# Patient Record
Sex: Female | Born: 1990 | Race: Black or African American | Hispanic: No | Marital: Married | Smoking: Never smoker
Health system: Southern US, Community
[De-identification: ages and names within clinical notes are randomized; demographics above are authoritative.]

## PROBLEM LIST (undated history)

## (undated) ENCOUNTER — Inpatient Hospital Stay (HOSPITAL_COMMUNITY): Payer: Self-pay

## (undated) ENCOUNTER — Inpatient Hospital Stay (HOSPITAL_COMMUNITY): Payer: Medicaid Other

## (undated) DIAGNOSIS — Z3490 Encounter for supervision of normal pregnancy, unspecified, unspecified trimester: Secondary | ICD-10-CM

## (undated) DIAGNOSIS — D219 Benign neoplasm of connective and other soft tissue, unspecified: Secondary | ICD-10-CM

## (undated) DIAGNOSIS — F419 Anxiety disorder, unspecified: Secondary | ICD-10-CM

## (undated) DIAGNOSIS — F32A Depression, unspecified: Secondary | ICD-10-CM

## (undated) DIAGNOSIS — O139 Gestational [pregnancy-induced] hypertension without significant proteinuria, unspecified trimester: Secondary | ICD-10-CM

## (undated) DIAGNOSIS — O039 Complete or unspecified spontaneous abortion without complication: Secondary | ICD-10-CM

## (undated) DIAGNOSIS — D649 Anemia, unspecified: Secondary | ICD-10-CM

## (undated) DIAGNOSIS — D271 Benign neoplasm of left ovary: Secondary | ICD-10-CM

## (undated) DIAGNOSIS — J45909 Unspecified asthma, uncomplicated: Secondary | ICD-10-CM

## (undated) DIAGNOSIS — Z9079 Acquired absence of other genital organ(s): Secondary | ICD-10-CM

## (undated) DIAGNOSIS — A6000 Herpesviral infection of urogenital system, unspecified: Secondary | ICD-10-CM

## (undated) DIAGNOSIS — Z9889 Other specified postprocedural states: Secondary | ICD-10-CM

## (undated) HISTORY — PX: ELBOW SURGERY: SHX618

---
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
## 2011-07-16 HISTORY — PX: LEFT OOPHORECTOMY: SHX1961

## 2016-08-10 ENCOUNTER — Emergency Department (HOSPITAL_COMMUNITY)
Admission: EM | Admit: 2016-08-10 | Discharge: 2016-08-10 | Disposition: A | Discharge status: Home / Self Care | Payer: Medicaid Other | Attending: Emergency Medicine | Admitting: Emergency Medicine

## 2016-08-10 ENCOUNTER — Encounter (HOSPITAL_COMMUNITY): Payer: Self-pay

## 2016-08-10 ENCOUNTER — Emergency Department (HOSPITAL_COMMUNITY): Payer: Medicaid Other

## 2016-08-10 DIAGNOSIS — R05 Cough: Secondary | ICD-10-CM | POA: Diagnosis present

## 2016-08-10 DIAGNOSIS — R509 Fever, unspecified: Secondary | ICD-10-CM | POA: Diagnosis not present

## 2016-08-10 DIAGNOSIS — R6889 Other general symptoms and signs: Secondary | ICD-10-CM

## 2016-08-10 IMAGING — CR DG CHEST 2V
2 series · 2 of 2 positions shown · non-contrast
Comparison: None available

CLINICAL DATA: Fever, cough

EXAM:
CHEST  2 VIEW

[chest pa]
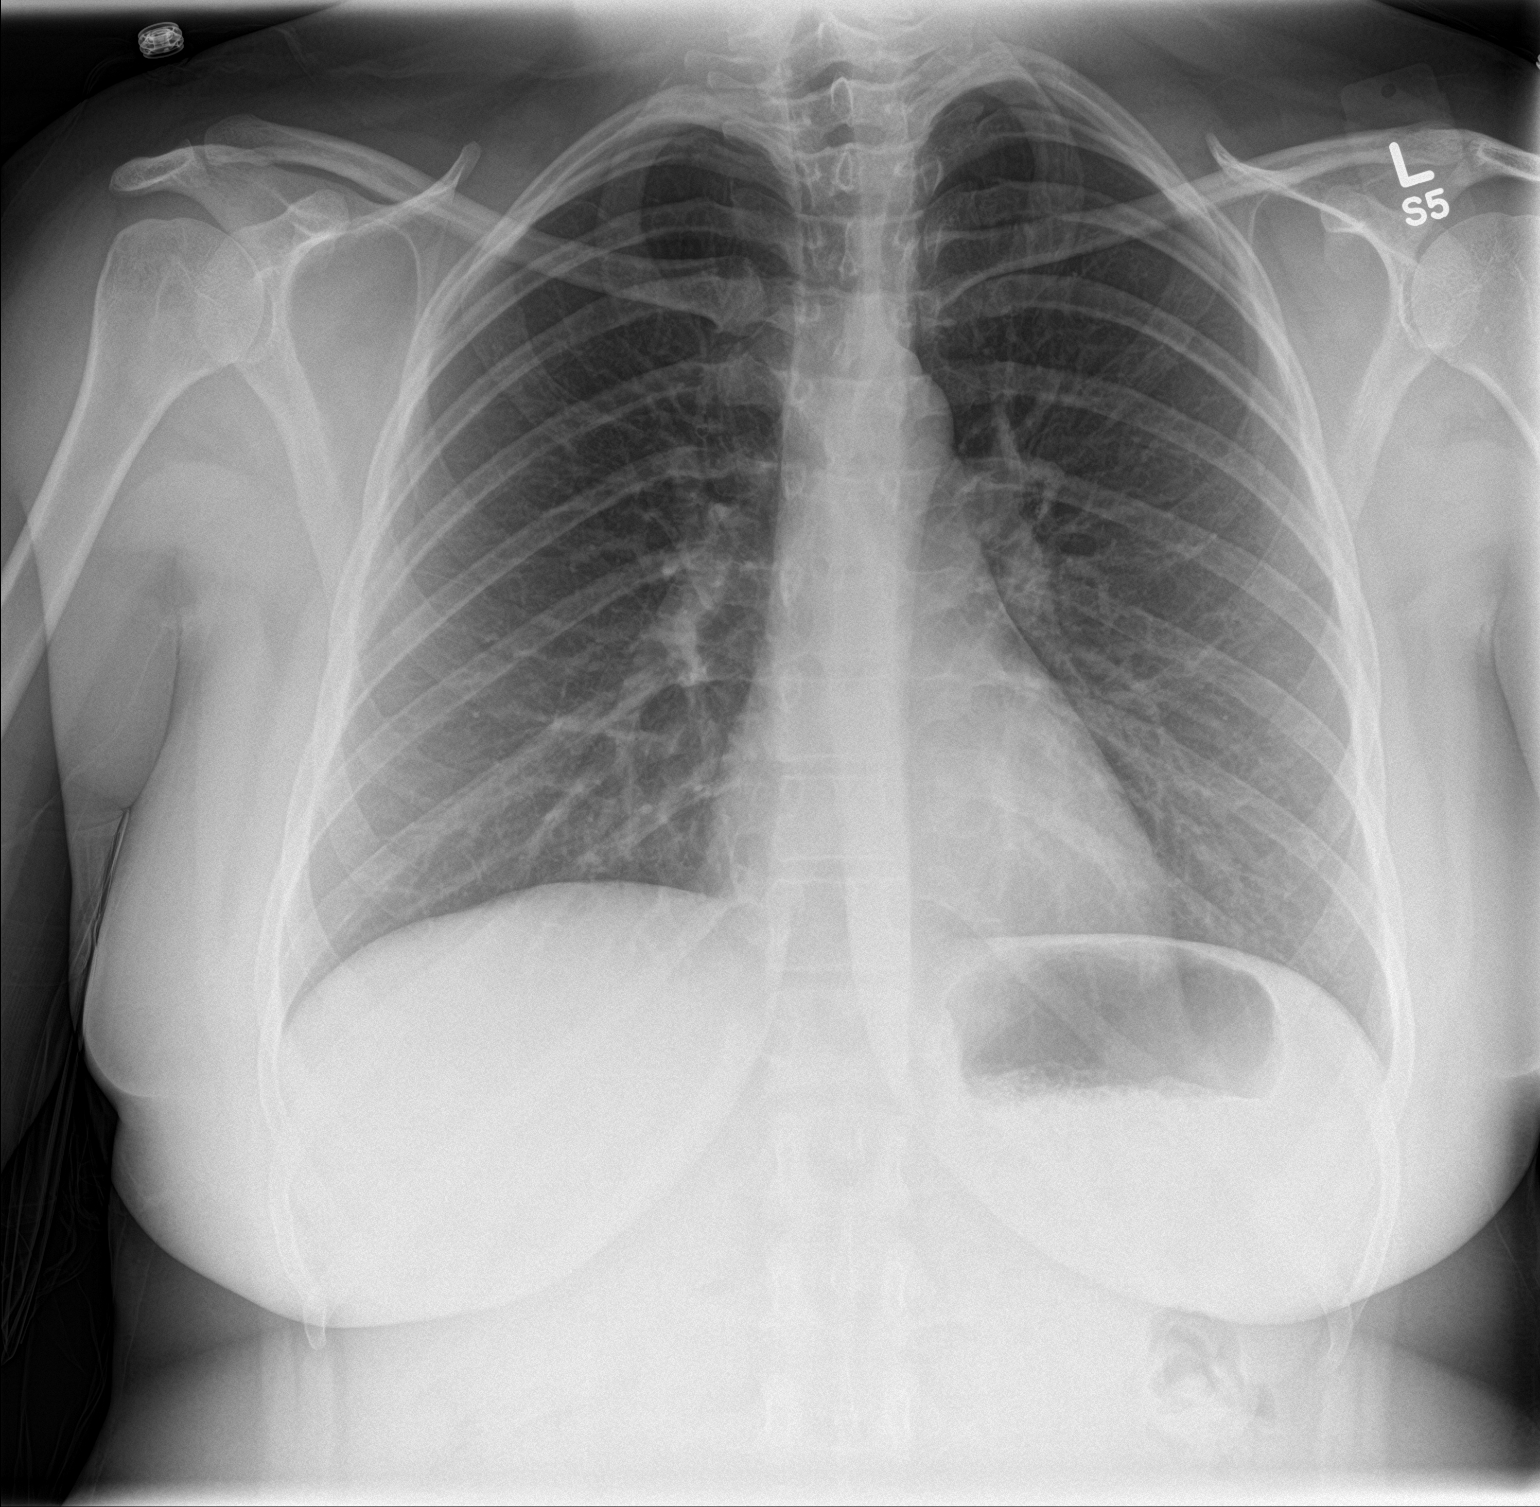

[chest lat]
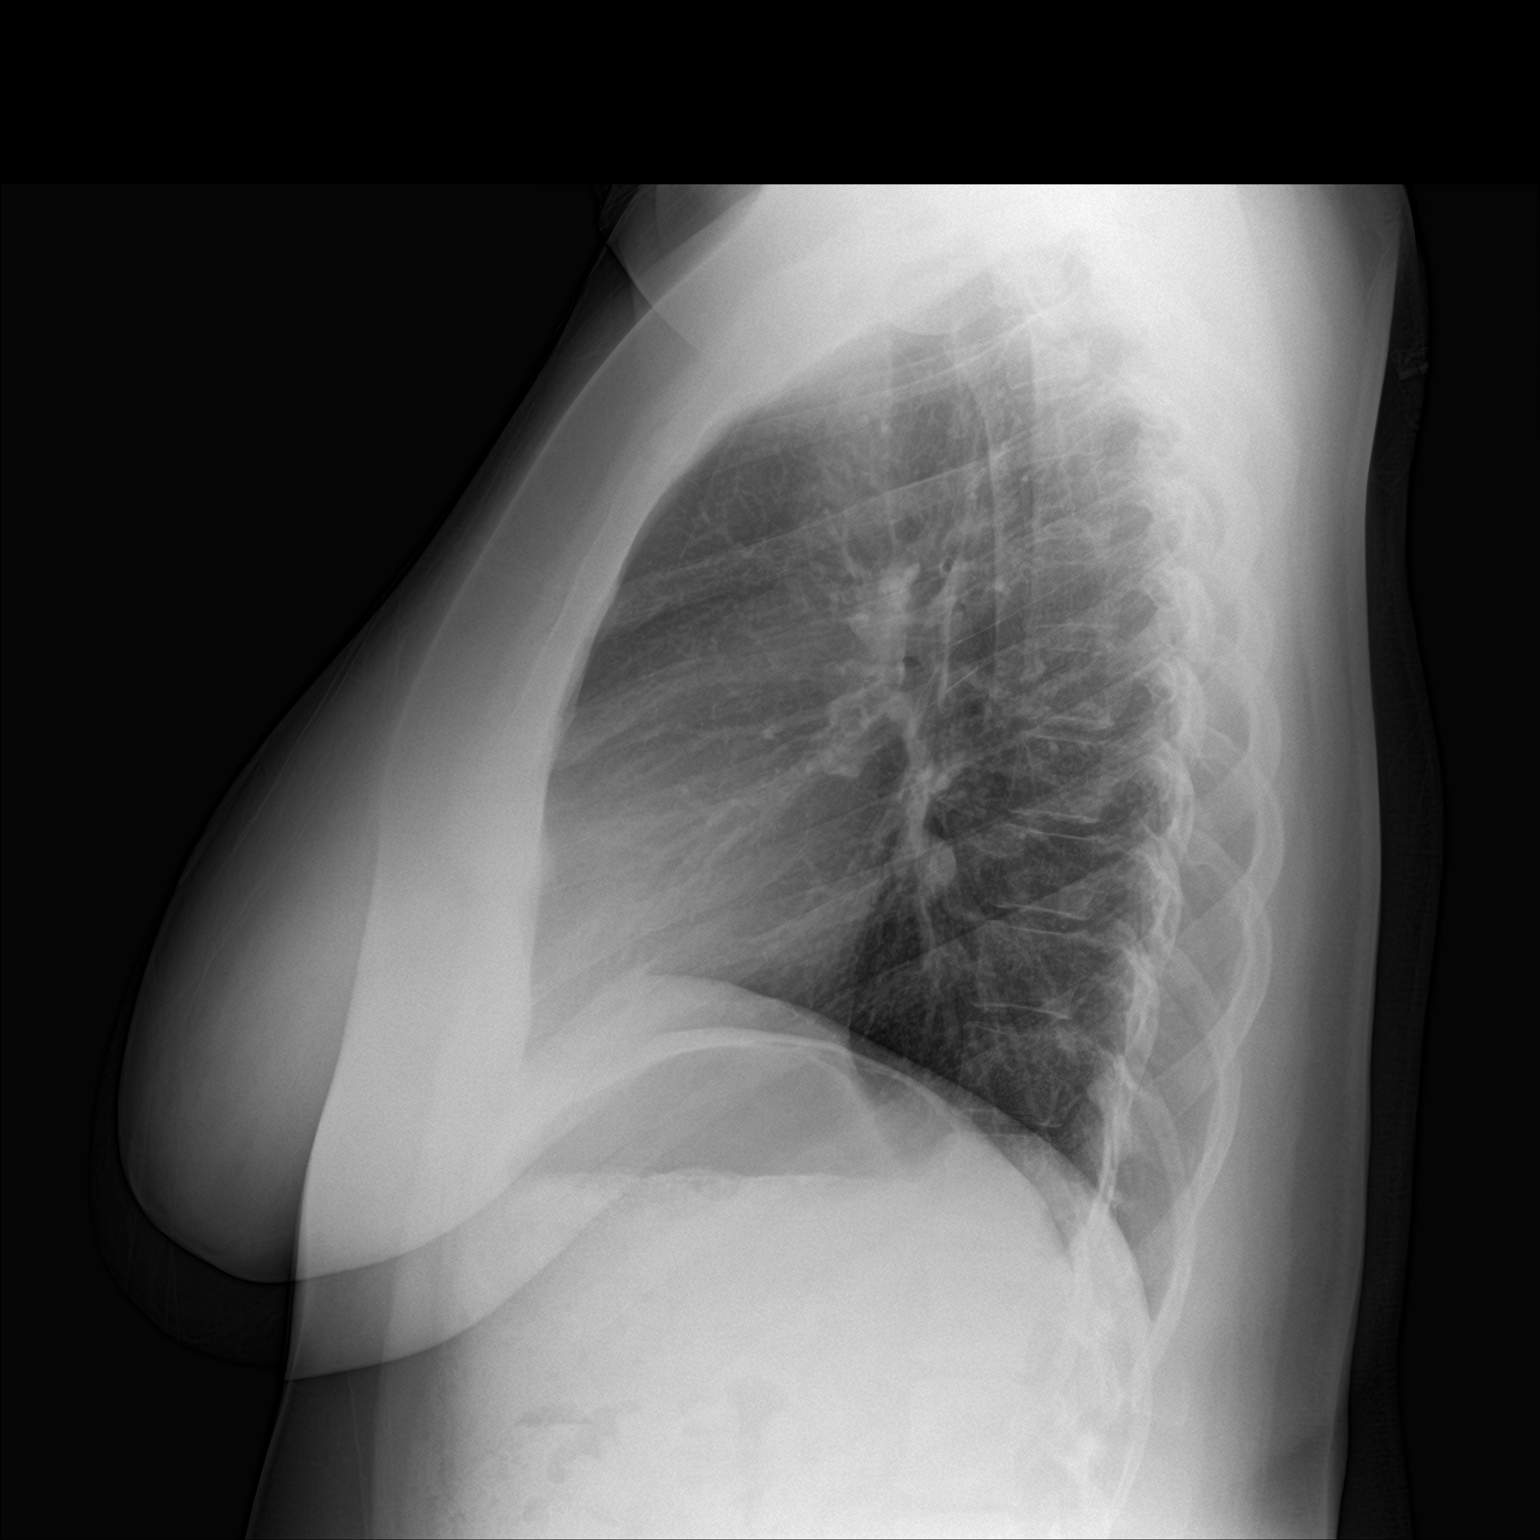

[2 of 2 positions shown; findings below may reference images not displayed]

FINDINGS: The heart size and mediastinal contours are within normal limits.
Both lungs are clear. The visualized skeletal structures are
unremarkable.
IMPRESSION: No active cardiopulmonary disease.

## 2016-08-10 MED ORDER — SODIUM CHLORIDE 0.9 % IV BOLUS (SEPSIS)
1000.0000 mL | Freq: Once | INTRAVENOUS | Status: AC
  Administered 2016-08-10: 1000 mL via INTRAVENOUS

## 2016-08-10 MED ORDER — ACETAMINOPHEN 325 MG PO TABS
650.0000 mg | ORAL_TABLET | Freq: Once | ORAL | Status: AC
  Administered 2016-08-10: 650 mg via ORAL
  Filled 2016-08-10: qty 2

## 2016-08-10 MED ORDER — IBUPROFEN 200 MG PO TABS
600.0000 mg | ORAL_TABLET | Freq: Once | ORAL | Status: AC
  Administered 2016-08-10: 600 mg via ORAL
  Filled 2016-08-10: qty 1

## 2016-08-10 NOTE — ED Triage Notes (Signed)
Patient here with cough, congestion, headache and chills x 2 days. States that she has fatigue with same. Alert and oriented

## 2016-08-10 NOTE — Discharge Instructions (Signed)
Take Tylenol/Motrin for fever/pain Drink plenty of fluids Return if symptoms are worsening

## 2016-08-10 NOTE — ED Provider Notes (Signed)
[facility] DEPT Provider Note   CSN: [id number] Arrival date & time: 08/10/16  1029     History   Chief Complaint Chief Complaint  Patient presents with  . Cough  . Generalized Body Aches    HPI Anna Burns is a 26 y.o. female who presents with flu-like symptoms. No significant PMH. She states that yesterday she started having a productive cough. This morning she woke up with fever, chills, headache, generalized body aches, and nasal congestion. Her daughter, who is 4, has similar symptoms and is currently the ED as well. She has not taken any medicine to help with her symptoms. She has not had her flu vaccine this year. She denies neck pain, ear pain, sore throat, SOB, chest pain, abdominal pain, N/V/D, dysuria.   HPI  History reviewed. No pertinent past medical history.  There are no active problems to display for this patient.   History reviewed. No pertinent surgical history.  OB History    No data available       Home Medications    Prior to Admission medications   Not on File    Family History No family history on file.  Social History Social History  Substance Use Topics  . Smoking status: Not on file  . Smokeless tobacco: Not on file  . Alcohol use Not on file     Allergies   Contrast media [iodinated diagnostic agents]   Review of Systems Review of Systems  Constitutional: Positive for chills, fatigue and fever.  HENT: Positive for congestion and rhinorrhea. Negative for sore throat.   Respiratory: Positive for cough. Negative for shortness of breath.   Cardiovascular: Negative for chest pain.  Gastrointestinal: Negative for abdominal pain, diarrhea, nausea and vomiting.  Genitourinary: Negative for dysuria.  Musculoskeletal: Positive for myalgias.  Neurological: Positive for headaches.  All other systems reviewed and are negative.    Physical Exam Updated Vital Signs BP 124/78   Pulse 105   Temp 100.1 F (37.8 C) (Oral)    Resp 22   Ht 5\' 4"  (1.626 m)   Wt 86.2 kg   SpO2 96%   BMI 32.61 kg/m   Physical Exam  Constitutional: She is oriented to person, place, and time. She appears well-developed and well-nourished. She appears ill. No distress.  HENT:  Head: Normocephalic and atraumatic.  Right Ear: Hearing, tympanic membrane, external ear and ear canal normal.  Left Ear: Hearing, tympanic membrane, external ear and ear canal normal.  Nose: Mucosal edema and rhinorrhea present.  Mouth/Throat: Uvula is midline, oropharynx is clear and moist and mucous membranes are normal.  Eyes: Conjunctivae are normal. Pupils are equal, round, and reactive to light. Right eye exhibits no discharge. Left eye exhibits no discharge. No scleral icterus.  Neck: Normal range of motion.  Cardiovascular: Tachycardia present.  Exam reveals no gallop and no friction rub.   No murmur heard. Pulmonary/Chest: Effort normal and breath sounds normal. No respiratory distress. She has no wheezes. She has no rales. She exhibits no tenderness.  Abdominal: Soft. Bowel sounds are normal. She exhibits no distension and no mass. There is no tenderness. There is no rebound and no guarding. No hernia.  Neurological: She is alert and oriented to person, place, and time.  Skin: Skin is warm and dry.  Psychiatric: She has a normal mood and affect. Her behavior is normal.  Nursing note and vitals reviewed.    ED Treatments / Results  Labs (all labs ordered are listed, but only abnormal  results are displayed) Labs Reviewed - No data to display  EKG  EKG Interpretation None       Radiology Dg Chest 2 View  Result Date: 08/10/2016 CLINICAL DATA:  Fever, cough EXAM: CHEST  2 VIEW COMPARISON:  None available FINDINGS: The heart size and mediastinal contours are within normal limits. Both lungs are clear. The visualized skeletal structures are unremarkable. IMPRESSION: No active cardiopulmonary disease. Electronically Signed   By: Jerilynn Mages.  Shick  M.D.   On: 08/10/2016 13:42    Procedures Procedures (including critical care time)  Medications Ordered in ED Medications  acetaminophen (TYLENOL) tablet 650 mg (650 mg Oral Given 08/10/16 1220)  sodium chloride 0.9 % bolus 1,000 mL (0 mLs Intravenous Stopped 08/10/16 1524)  ibuprofen (ADVIL,MOTRIN) tablet 600 mg (600 mg Oral Given 08/10/16 1406)  sodium chloride 0.9 % bolus 1,000 mL (0 mLs Intravenous Stopped 08/10/16 1640)     Initial Impression / Assessment and Plan / ED Course  I have reviewed the triage vital signs and the nursing notes.  Pertinent labs & imaging results that were available during my care of the patient were reviewed by me and considered in my medical decision making (see chart for details).  26 year old female presents with flu-like illness. She is febrile and tachycardic. Otherwise vitals are normal. Tylenol given which did not improve fever. Motrin and IVF given. CXR ordered  On recheck, temp is improved. CXR negative. She is still tachycardic. Another bolus ordered.   3rd recheck, pt reports subjective improvement. Tachycardia is improved. Advised supportive care. Opportunity for questions provided and all questions answered. Return precautions given.    Final Clinical Impressions(s) / ED Diagnoses   Final diagnoses:  Flu-like symptoms    New Prescriptions New Prescriptions   No medications on file     Recardo Evangelist, PA-C 08/10/16 [place], MD 08/11/16 [phone]

## 2016-08-10 NOTE — ED Notes (Signed)
Patient transported to X-ray 

## 2016-10-23 ENCOUNTER — Encounter (HOSPITAL_COMMUNITY): Payer: Self-pay | Admitting: *Deleted

## 2016-10-23 ENCOUNTER — Inpatient Hospital Stay (HOSPITAL_COMMUNITY)
Admission: AD | Admit: 2016-10-23 | Discharge: 2016-10-23 | Disposition: A | Discharge status: Home / Self Care | Payer: Medicaid Other | Source: Ambulatory Visit | Attending: Family Medicine | Admitting: Family Medicine

## 2016-10-23 ENCOUNTER — Inpatient Hospital Stay (HOSPITAL_COMMUNITY): Payer: Medicaid Other

## 2016-10-23 DIAGNOSIS — O418X10 Other specified disorders of amniotic fluid and membranes, first trimester, not applicable or unspecified: Secondary | ICD-10-CM | POA: Diagnosis not present

## 2016-10-23 DIAGNOSIS — O209 Hemorrhage in early pregnancy, unspecified: Secondary | ICD-10-CM

## 2016-10-23 DIAGNOSIS — Z3A01 Less than 8 weeks gestation of pregnancy: Secondary | ICD-10-CM | POA: Insufficient documentation

## 2016-10-23 DIAGNOSIS — O468X1 Other antepartum hemorrhage, first trimester: Secondary | ICD-10-CM | POA: Diagnosis not present

## 2016-10-23 DIAGNOSIS — O208 Other hemorrhage in early pregnancy: Secondary | ICD-10-CM | POA: Diagnosis not present

## 2016-10-23 HISTORY — DX: Benign neoplasm of left ovary: D27.1

## 2016-10-23 LAB — CBC
HEMATOCRIT: 36.2 % (ref 36.0–46.0)
Hemoglobin: 11.7 g/dL — ABNORMAL LOW (ref 12.0–15.0)
MCH: 25.7 pg — AB (ref 26.0–34.0)
MCHC: 32.3 g/dL (ref 30.0–36.0)
MCV: 79.6 fL (ref 78.0–100.0)
Platelets: 315 10*3/uL (ref 150–400)
RBC: 4.55 MIL/uL (ref 3.87–5.11)
RDW: 13.7 % (ref 11.5–15.5)
WBC: 8.1 10*3/uL (ref 4.0–10.5)

## 2016-10-23 LAB — URINALYSIS, ROUTINE W REFLEX MICROSCOPIC
Bilirubin Urine: NEGATIVE
GLUCOSE, UA: NEGATIVE mg/dL
HGB URINE DIPSTICK: NEGATIVE
Ketones, ur: NEGATIVE mg/dL
Leukocytes, UA: NEGATIVE
Nitrite: NEGATIVE
PH: 6 (ref 5.0–8.0)
Protein, ur: NEGATIVE mg/dL
SPECIFIC GRAVITY, URINE: 1.019 (ref 1.005–1.030)

## 2016-10-23 LAB — ABO/RH: ABO/RH(D): B POS

## 2016-10-23 LAB — HCG, QUANTITATIVE, PREGNANCY: hCG, Beta Chain, Quant, S: 15577 m[IU]/mL — ABNORMAL HIGH (ref <5)

## 2016-10-23 LAB — WET PREP, GENITAL
Clue Cells Wet Prep HPF POC: NONE SEEN
SPERM: NONE SEEN
TRICH WET PREP: NONE SEEN
YEAST WET PREP: NONE SEEN

## 2016-10-23 LAB — POCT PREGNANCY, URINE: Preg Test, Ur: POSITIVE — AB

## 2016-10-23 IMAGING — US US OB TRANSVAGINAL
1 series · 15 of 28 positions shown · non-contrast
Comparison: None

CLINICAL DATA: Vaginal bleeding in first trimester of pregnancy,
spotting this morning and intermittently for 3 weeks, about 6 weeks
pregnant, prior LEFT oophorectomy for benign ovarian tumor

EXAM:
OBSTETRIC <14 WK US AND TRANSVAGINAL OB US
TECHNIQUE: Both transabdominal and transvaginal ultrasound examinations were
performed for complete evaluation of the gestation as well as the
maternal uterus, adnexal regions, and pelvic cul-de-sac.
Transvaginal technique was performed to assess early pregnancy.

[Series 1: us ob transvaginal · 113 acquisitions, 15 frames shown]
[im 1/113]
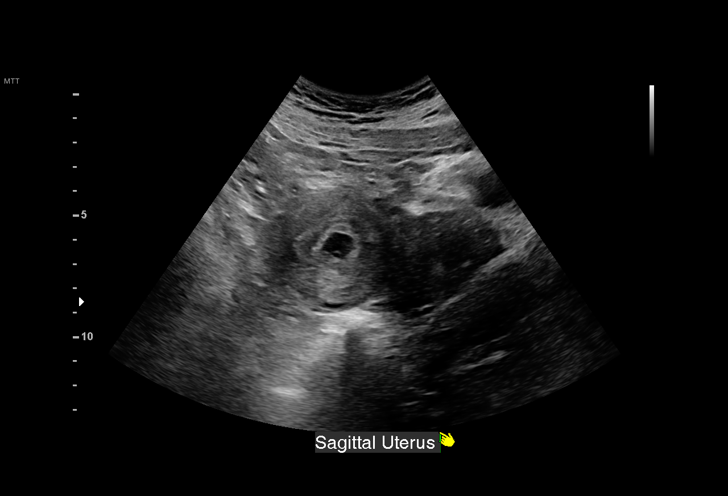
[im 9/113]
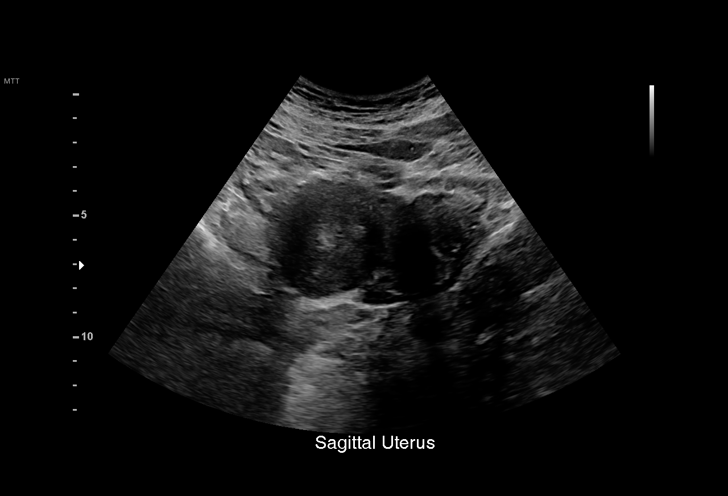
[im 17/113]
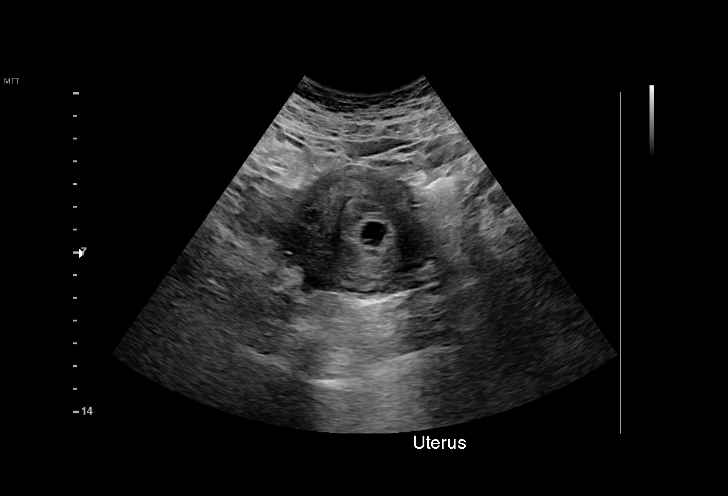
[im 25/113]
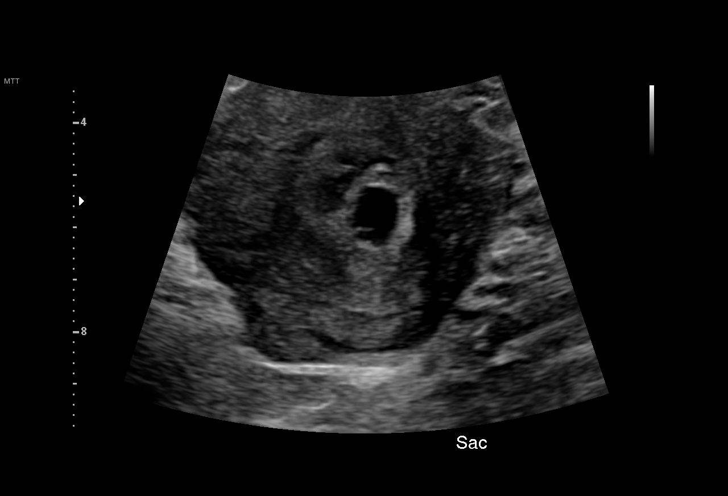
[im 34/113]
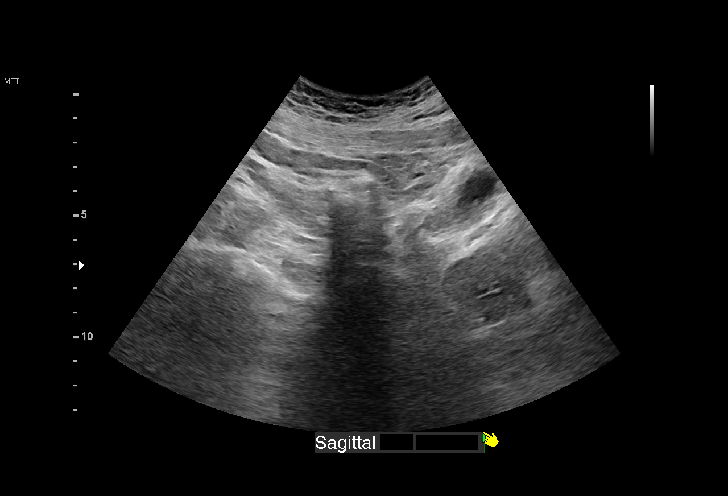
[im 42/113]
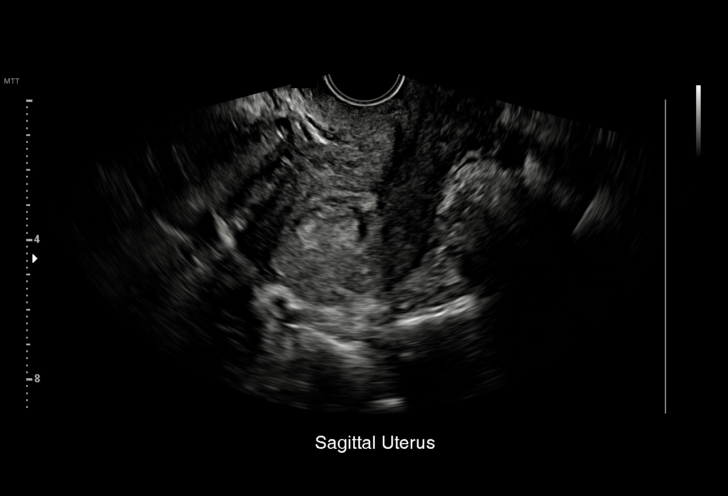
[im 50/113]
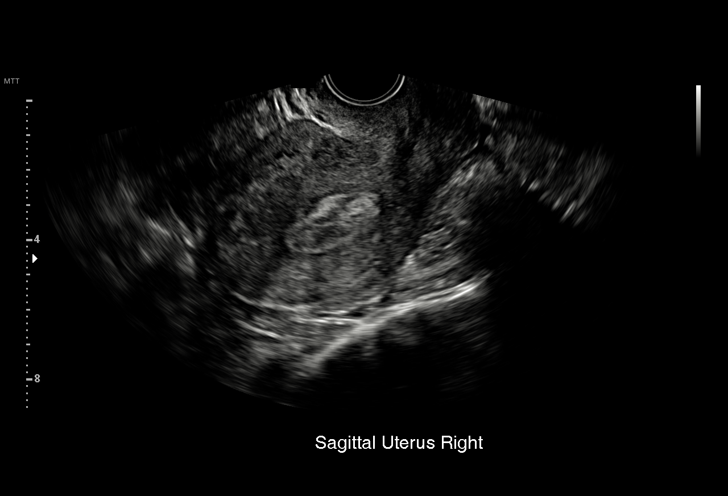
[im 59/113]
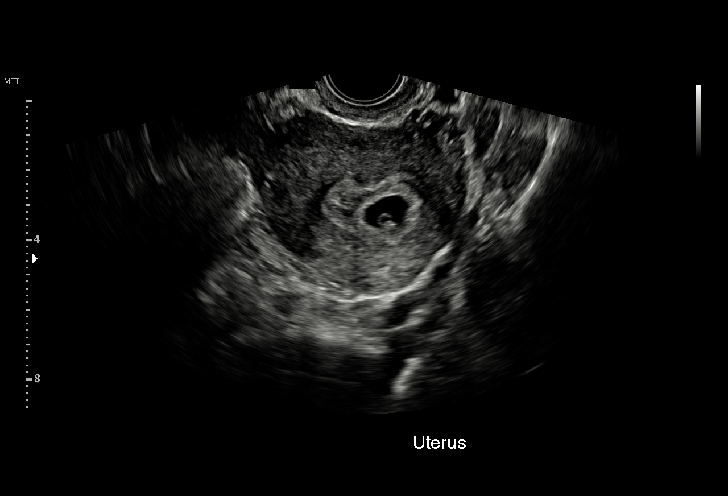
[im 63/113]
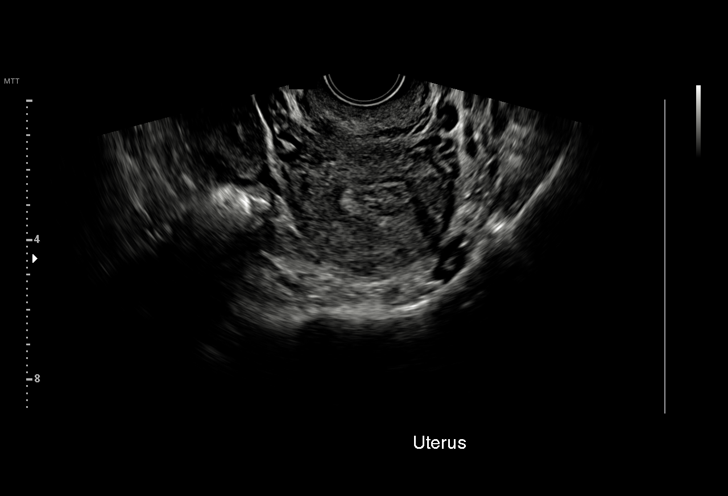
[im 71/113]
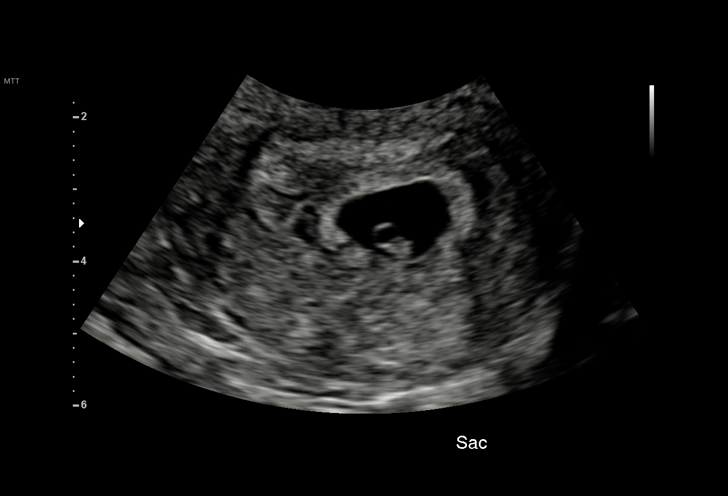
[im 79/113]
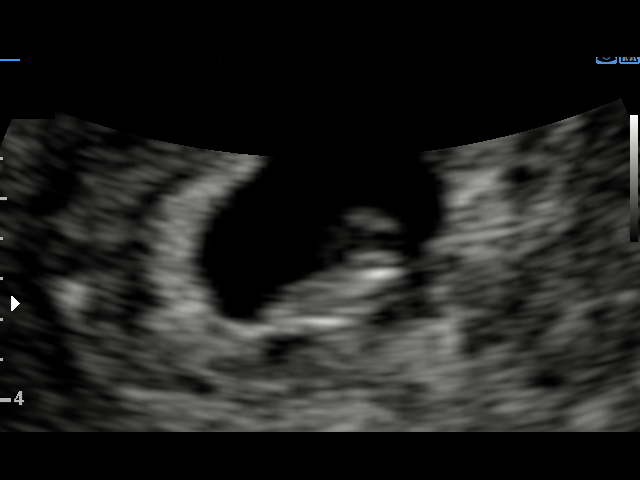
[im 88/113]
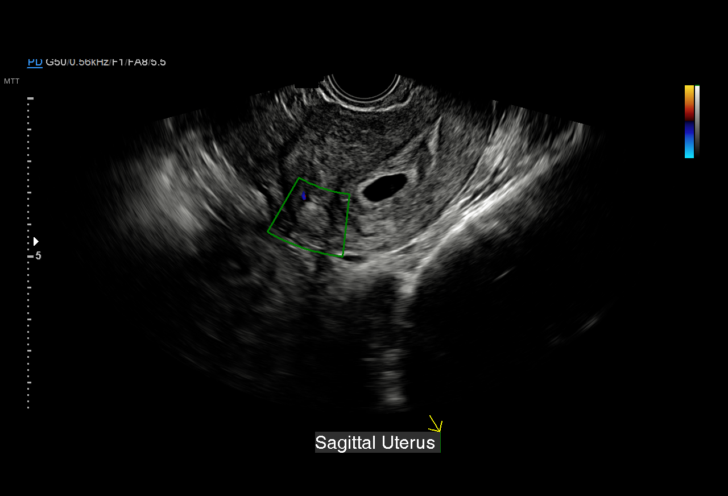
[im 96/113]
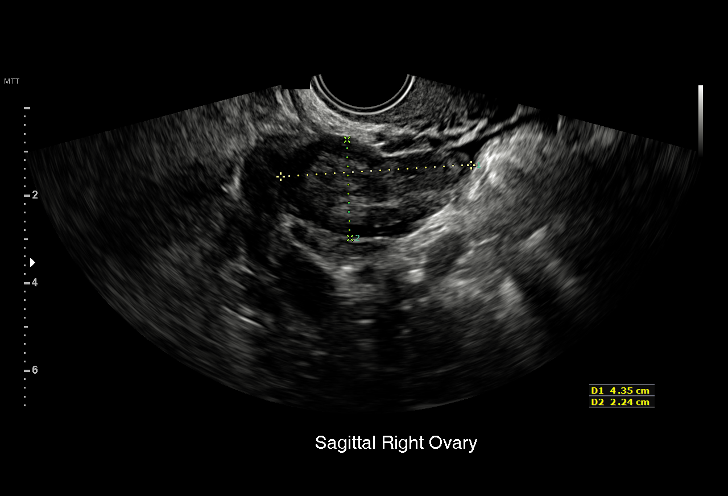
[im 104/113]
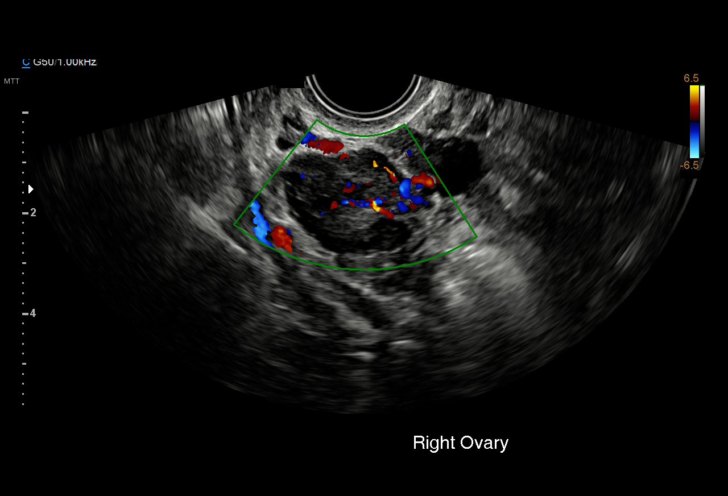
[im 113/113]
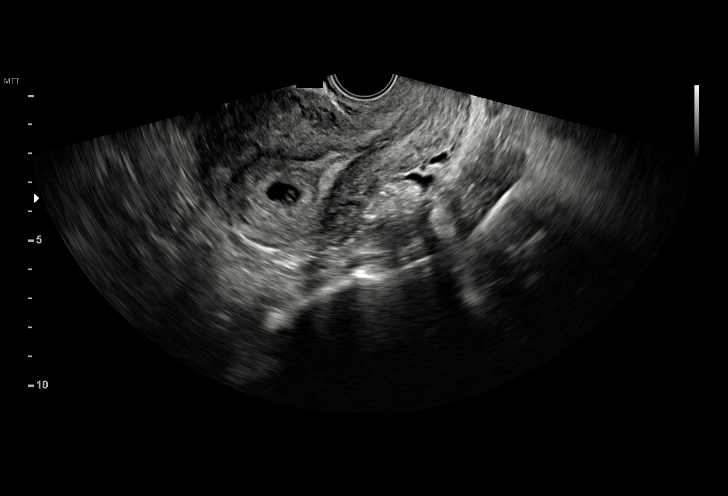

[15 of 28 positions shown; findings below may reference images not displayed]

FINDINGS: Intrauterine gestational sac: Present

Yolk sac:  Present

Embryo:  Present

Cardiac Activity: Present

Heart Rate: 117  bpm

CRL:  7.4  mm   6 w   4 d                  US EDC: [DATE]

Subchorionic hemorrhage:  Small subchronic hemorrhage

Maternal uterus/adnexae:

Hyperechoic nodule uterine fundus 10 x 14 x 12 mm likely a small
lipoleiomyoma.

No additional uterine masses.

Ovaries unremarkable.

Trace free fluid.
IMPRESSION: Single live intrauterine gestation at 6 weeks 4 days EGA by
crown-rump length.

Small subchronic hemorrhage.

Probable small lipoleiomyoma at uterine fundus.

## 2016-10-23 NOTE — Discharge Instructions (Signed)
Subchorionic Hematoma A subchorionic hematoma is a gathering of blood between the outer wall of the placenta and the inner wall of the womb (uterus). The placenta is the organ that connects the fetus to the wall of the uterus. The placenta performs the feeding, breathing (oxygen to the fetus), and waste removal (excretory work) of the fetus. Subchorionic hematoma is the most common abnormality found on a result from ultrasonography done during the first trimester or early second trimester of pregnancy. If there has been little or no vaginal bleeding, early small hematomas usually shrink on their own and do not affect your baby or pregnancy. The blood is gradually absorbed over 1-2 weeks. When bleeding starts later in pregnancy or the hematoma is larger or occurs in an older pregnant woman, the outcome may not be as good. Larger hematomas may get bigger, which increases the chances for miscarriage. Subchorionic hematoma also increases the risk of premature detachment of the placenta from the uterus, preterm (premature) labor, and stillbirth. Follow these instructions at home:  Stay on bed rest if your health care provider recommends this. Although bed rest will not prevent more bleeding or prevent a miscarriage, your health care provider may recommend bed rest until you are advised otherwise.  Avoid heavy lifting (more than 10 lb [4.5 kg]), exercise, sexual intercourse, or douching as directed by your health care provider.  Keep track of the number of pads you use each day and how soaked (saturated) they are. Write down this information.  Do not use tampons.  Keep all follow-up appointments as directed by your health care provider. Your health care provider may ask you to have follow-up blood tests or ultrasound tests or both. Get help right away if:  You have severe cramps in your stomach, back, abdomen, or pelvis.  You have a fever.  You pass large clots or tissue. Save any tissue for your  health care provider to look at.  Your bleeding increases or you become lightheaded, feel weak, or have fainting episodes. This information is not intended to replace advice given to you by your health care provider. Make sure you discuss any questions you have with your health care provider. Document Released: 10/16/2006 Document Revised: 12/07/2015 Document Reviewed: 01/28/2013 Elsevier Interactive Patient Education  2017 [employer].     Pelvic Rest Pelvic rest may be recommended if:  Your placenta is partially or completely covering the opening of your cervix (placenta previa).  There is bleeding between the wall of the uterus and the amniotic sac in the first trimester of pregnancy (subchorionic hemorrhage).  You went into labor too early (preterm labor). Based on your overall health and the health of your baby, your health care provider will decide if pelvic rest is right for you. How do I rest my pelvis? For as long as told by your health care provider:  Do not have sex, sexual stimulation, or an orgasm.  Do not use tampons. Do not douche. Do not put anything in your vagina.  Do not lift anything that is heavier than 10 lb (4.5 kg).  Avoid activities that take a lot of effort (are strenuous).  Avoid any activity in which your pelvic muscles could become strained. When should I seek medical care? Seek medical care if you have:  Cramping pain in your lower abdomen.  Vaginal discharge.  A low, dull backache.  Regular contractions.  Uterine tightening. When should I seek immediate medical care? Seek immediate medical care if:  You have vaginal  bleeding and you are pregnant. This information is not intended to replace advice given to you by your health care provider. Make sure you discuss any questions you have with your health care provider. Document Released: 10/26/2010 Document Revised: 12/07/2015 Document Reviewed: 01/02/2015 Elsevier Interactive Patient  Education  2017 [employer].

## 2016-10-23 NOTE — MAU Provider Note (Signed)
Chief Complaint: Vaginal Bleeding   SUBJECTIVE HPI: Anna Burns is a 26 y.o. G2P1 at [redacted]w[redacted]d who presents to Maternity Admissions reporting vaginal bleeding.   Patient states a couple hours prior to coming to MAU, she was using the bathroom to urinate. When she wiped, she noticed bright red blood on the toilet paper, she wiped again and had a little bit more. Did not soak the toilet paper, just streaks of red (brought in picture on phone: light bright red blood on toilet paper.). Denies any bleeding in underwear or continued bleeding when she used the bathroom here. She denies any pain or cramping. Denies any abnormal vaginal discharge. No recent intercourse. Denies any N/V/D, F/C, CP/SOB.     Past Medical History:  Diagnosis Date  . Ovarian tumor (benign), left    OB History  Gravida Para Term Preterm AB Living  2 1       1   SAB TAB Ectopic Multiple Live Births          1    # Outcome Date GA Lbr Len/2nd Weight Sex Delivery Anes PTL Lv  2 Current           1 Para              Past Surgical History:  Procedure Laterality Date  . ELBOW SURGERY    . LEFT OOPHORECTOMY  2013   Social History   Social History  . Marital status: Single    Spouse name: N/A  . Number of children: N/A  . Years of education: N/A   Occupational History  . Not on file.   Social History Main Topics  . Smoking status: Never Smoker  . Smokeless tobacco: Never Used  . Alcohol use No  . Drug use: No  . Sexual activity: Not on file   Other Topics Concern  . Not on file   Social History Narrative  . No narrative on file   No current facility-administered medications on file prior to encounter.    No current outpatient prescriptions on file prior to encounter.   Allergies  Allergen Reactions  . Contrast Media [Iodinated Diagnostic Agents] Itching and Other (See Comments)    Itching on tongue    I have reviewed the past Medical Hx, Surgical Hx, Social Hx, Allergies and Medications.    REVIEW OF SYSTEMS  A comprehensive ROS was negative except per HPI.   OBJECTIVE Patient Vitals for the past 24 hrs:  BP Temp Temp src Pulse Resp Height Weight  10/23/16 0952 116/72 98.6 F (37 C) Oral 86 16 5' 3.5" (1.613 m) 189 lb (85.7 kg)    PHYSICAL EXAM Constitutional: Well-developed, well-nourished female in no acute distress.  Cardiovascular: normal rate, rhythm, no murmurs Respiratory: normal rate and effort.  CTAB GI: Abd soft, non-tender, non-distended. Pos BS x 4 MS: Extremities nontender, no edema, normal ROM Neurologic: Alert and oriented x 4.  GU: Neg CVAT. SPECULUM EXAM: NEFG, physiologic discharge, no blood noted, cervix clean BIMANUAL: cervix closed; uterus normal size, no adnexal tenderness or masses. No CMT.  LAB RESULTS Results for orders placed or performed during the hospital encounter of 10/23/16 (from the past 24 hour(s))  CBC     Status: Abnormal   Collection Time: 10/23/16  9:44 AM  Result Value Ref Range   WBC 8.1 4.0 - 10.5 K/uL   RBC 4.55 3.87 - 5.11 MIL/uL   Hemoglobin 11.7 (L) 12.0 - 15.0 g/dL   HCT 36.2 36.0 - 46.0 %  MCV 79.6 78.0 - 100.0 fL   MCH 25.7 (L) 26.0 - 34.0 pg   MCHC 32.3 30.0 - 36.0 g/dL   RDW 13.7 11.5 - 15.5 %   Platelets 315 150 - 400 K/uL  hCG, quantitative, pregnancy     Status: Abnormal   Collection Time: 10/23/16  9:44 AM  Result Value Ref Range   hCG, Beta Chain, Quant, S 15,577 (H) <5 mIU/mL  ABO/Rh     Status: None (Preliminary result)   Collection Time: 10/23/16  9:44 AM  Result Value Ref Range   ABO/RH(D) B POS   Urinalysis, Routine w reflex microscopic     Status: None   Collection Time: 10/23/16  9:56 AM  Result Value Ref Range   Color, Urine YELLOW YELLOW   APPearance CLEAR CLEAR   Specific Gravity, Urine 1.019 1.005 - 1.030   pH 6.0 5.0 - 8.0   Glucose, UA NEGATIVE NEGATIVE mg/dL   Hgb urine dipstick NEGATIVE NEGATIVE   Bilirubin Urine NEGATIVE NEGATIVE   Ketones, ur NEGATIVE NEGATIVE mg/dL    Protein, ur NEGATIVE NEGATIVE mg/dL   Nitrite NEGATIVE NEGATIVE   Leukocytes, UA NEGATIVE NEGATIVE  Pregnancy, urine POC     Status: Abnormal   Collection Time: 10/23/16 10:08 AM  Result Value Ref Range   Preg Test, Ur POSITIVE (A) NEGATIVE    IMAGING US Ob Comp Less 14 Wks  Result Date: 10/23/2016 CLINICAL DATA:  Vaginal bleeding in first trimester of pregnancy, spotting this morning and intermittently for 3 weeks, about [redacted] weeks pregnant, prior LEFT oophorectomy for benign ovarian tumor EXAM: OBSTETRIC <14 WK [place] AND TRANSVAGINAL OB US TECHNIQUE: Both transabdominal and transvaginal ultrasound examinations were performed for complete evaluation of the gestation as well as the maternal uterus, adnexal regions, and pelvic cul-de-sac. Transvaginal technique was performed to assess early pregnancy. 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 No additional uterine masses. Ovaries unremarkable. Trace free fluid. IMPRESSION: Single live intrauterine gestation at 6 weeks 4 days EGA by crown-rump length. Small subchronic hemorrhage. 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 Electronically Signed   By: Lavonia Dana M.D.   On: 10/23/2016 12:20   US Ob Transvaginal  Result Date: 10/23/2016 CLINICAL DATA:  Vaginal bleeding in first trimester of pregnancy, spotting this morning and intermittently for 3 weeks, about [redacted] weeks pregnant, prior LEFT oophorectomy for benign ovarian tumor EXAM: OBSTETRIC <14 WK [place] AND TRANSVAGINAL OB US TECHNIQUE: Both transabdominal and transvaginal ultrasound examinations were performed for complete evaluation of the gestation as well as the maternal uterus,  adnexal regions, and pelvic cul-de-sac. Transvaginal technique was performed to assess early pregnancy. 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  No additional uterine masses. Ovaries unremarkable. Trace free fluid. IMPRESSION: Single live intrauterine gestation at 6 weeks 4 days EGA by crown-rump length. Small subchronic hemorrhage. 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 Electronically Signed   By: Lavonia Dana M.D.   On: 10/23/2016 12:20    MAU COURSE TVUS - small subchorionic hematoma BHCG 15,577 CBC - Hb 11.7 UA - clean ABO - B+ Wet prep - NEG GC/CT - pending  MDM Plan of care reviewed with patient, including labs and tests ordered and medical treatment. Reassured patient +FHT, 6 week 4 day sized IUP seen. Discussed indications to return to MAU, bleeding precautions given. Explained diagnosis, small subchorionic hematoma, and answered questions patient had. +FHT.    ASSESSMENT 1. Subchorionic hemorrhage of placenta in first trimester, single or unspecified fetus   2. Vaginal bleeding in pregnancy, first trimester     PLAN Discharge home in stable condition. Bleeding precautions given Follow up with OB provider in the next 2-3 weeks   Allergies as of 10/23/2016      Reactions   Contrast Media [iodinated Diagnostic Agents] Itching, Other (See Comments)   Itching on tongue      Medication List    TAKE these medications   prenatal multivitamin Tabs tablet Take 1 tablet by mouth daily at 12 noon.        Katherine Basset, DO OB Fellow 10/23/2016 11:09 AM

## 2016-10-23 NOTE — MAU Note (Signed)
Pt woke up this morning spotting, bled 2 weeks ago after intercourse, had orgasm last night but no penetration.  Has light cramping, not a new problem.

## 2016-10-24 LAB — GC/CHLAMYDIA PROBE AMP (~~LOC~~) NOT AT ARMC
Chlamydia: NEGATIVE
NEISSERIA GONORRHEA: NEGATIVE

## 2016-10-24 LAB — HIV ANTIBODY (ROUTINE TESTING W REFLEX): HIV Screen 4th Generation wRfx: NONREACTIVE

## 2016-11-28 ENCOUNTER — Inpatient Hospital Stay (HOSPITAL_COMMUNITY)
Admission: AD | Admit: 2016-11-28 | Discharge: 2016-11-28 | Discharge status: Against Medical Advice | Payer: Medicaid Other | Source: Ambulatory Visit | Attending: Obstetrics & Gynecology | Admitting: Obstetrics & Gynecology

## 2016-11-28 ENCOUNTER — Encounter (HOSPITAL_COMMUNITY): Payer: Self-pay | Admitting: *Deleted

## 2016-11-28 DIAGNOSIS — O26891 Other specified pregnancy related conditions, first trimester: Secondary | ICD-10-CM

## 2016-11-28 DIAGNOSIS — N898 Other specified noninflammatory disorders of vagina: Secondary | ICD-10-CM

## 2016-11-28 DIAGNOSIS — Z8759 Personal history of other complications of pregnancy, childbirth and the puerperium: Secondary | ICD-10-CM | POA: Insufficient documentation

## 2016-11-28 LAB — URINALYSIS, ROUTINE W REFLEX MICROSCOPIC
BACTERIA UA: NONE SEEN
Bilirubin Urine: NEGATIVE
Glucose, UA: NEGATIVE mg/dL
KETONES UR: NEGATIVE mg/dL
LEUKOCYTES UA: NEGATIVE
Nitrite: NEGATIVE
PH: 7 (ref 5.0–8.0)
Protein, ur: NEGATIVE mg/dL
Specific Gravity, Urine: 1.02 (ref 1.005–1.030)

## 2016-11-28 LAB — POCT PREGNANCY, URINE: Preg Test, Ur: POSITIVE — AB

## 2016-11-28 NOTE — MAU Note (Signed)
Patient signed AMA form and left.

## 2016-11-28 NOTE — MAU Note (Signed)
PT SAYS   SHE HAD A  TAB  ON FRI    5-11-   10.2 WEEKS  [place]  - [place].  Marland Kitchen    ALL OK UNTIL  LAST NIGHT -  STARTED PASSING  CLOTS  WITH   ABD  PAIN .  SHE CALLED OFFICE  YESTERDAY-   TOLD  IF BLEEDING  HEAVY   AND  NAUSEA-   THEN  NEEDED   TO BE  SEEN .    PAD ON IN TRIAGE -   SMALL AMT  RED   ON PAD.      Marland Kitchen    TOOK IBUPROFEN  AT 0630   FOR  PAIN.

## 2017-03-27 DIAGNOSIS — Z3490 Encounter for supervision of normal pregnancy, unspecified, unspecified trimester: Secondary | ICD-10-CM

## 2017-03-27 HISTORY — DX: Encounter for supervision of normal pregnancy, unspecified, unspecified trimester: Z34.90

## 2017-04-04 ENCOUNTER — Inpatient Hospital Stay (HOSPITAL_COMMUNITY): Payer: Medicaid Other

## 2017-04-04 ENCOUNTER — Inpatient Hospital Stay (HOSPITAL_COMMUNITY)
Admission: AD | Admit: 2017-04-04 | Discharge: 2017-04-04 | Disposition: A | Discharge status: Home / Self Care | Payer: Medicaid Other | Source: Ambulatory Visit | Attending: Obstetrics and Gynecology | Admitting: Obstetrics and Gynecology

## 2017-04-04 ENCOUNTER — Encounter (HOSPITAL_COMMUNITY): Payer: Self-pay | Admitting: *Deleted

## 2017-04-04 DIAGNOSIS — J45909 Unspecified asthma, uncomplicated: Secondary | ICD-10-CM | POA: Diagnosis not present

## 2017-04-04 DIAGNOSIS — O039 Complete or unspecified spontaneous abortion without complication: Secondary | ICD-10-CM

## 2017-04-04 DIAGNOSIS — Z9079 Acquired absence of other genital organ(s): Secondary | ICD-10-CM

## 2017-04-04 HISTORY — DX: Acquired absence of other genital organ(s): Z90.79

## 2017-04-04 HISTORY — DX: Complete or unspecified spontaneous abortion without complication: O03.9

## 2017-04-04 LAB — CBC
HEMATOCRIT: 30.5 % — AB (ref 36.0–46.0)
Hemoglobin: 9.9 g/dL — ABNORMAL LOW (ref 12.0–15.0)
MCH: 25.6 pg — ABNORMAL LOW (ref 26.0–34.0)
MCHC: 32.5 g/dL (ref 30.0–36.0)
MCV: 79 fL (ref 78.0–100.0)
Platelets: 399 10*3/uL (ref 150–400)
RBC: 3.86 MIL/uL — ABNORMAL LOW (ref 3.87–5.11)
RDW: 14 % (ref 11.5–15.5)
WBC: 8.4 10*3/uL (ref 4.0–10.5)

## 2017-04-04 LAB — HCG, QUANTITATIVE, PREGNANCY: hCG, Beta Chain, Quant, S: 345 m[IU]/mL — ABNORMAL HIGH (ref <5)

## 2017-04-04 IMAGING — US US OB COMP LESS 14 WK
1 series · 15 of 28 positions shown · non-contrast
Comparison: None.

CLINICAL DATA: Pregnant, abdominal pain, vaginal bleeding with
clots

EXAM:
OBSTETRIC <14 WK US AND TRANSVAGINAL OB US
TECHNIQUE: Both transabdominal and transvaginal ultrasound examinations were
performed for complete evaluation of the gestation as well as the
maternal uterus, adnexal regions, and pelvic cul-de-sac.
Transvaginal technique was performed to assess early pregnancy.

[Series 1: us ob comp less 14 wk · 15 of 48 slices shown]
[im 1/48]
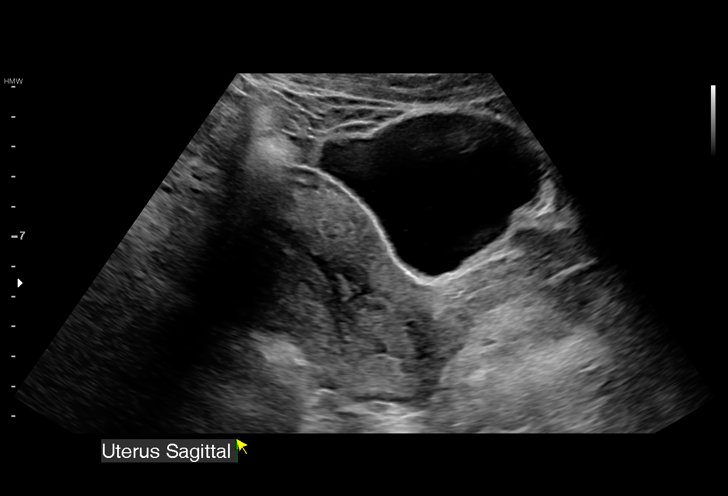
[im 4/48]
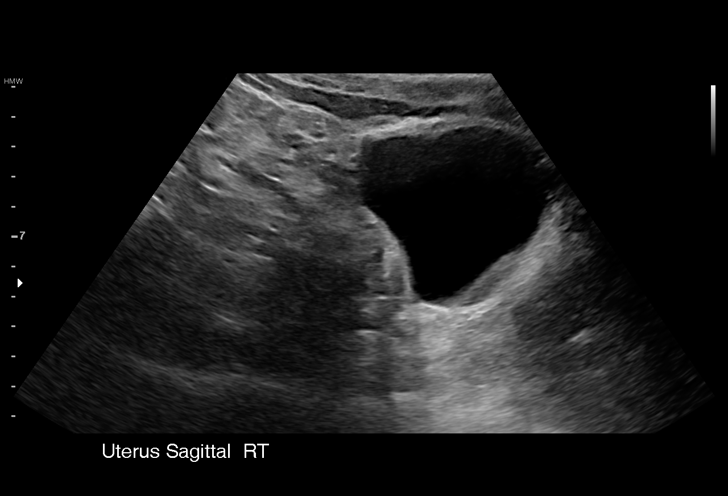
[im 7/48]
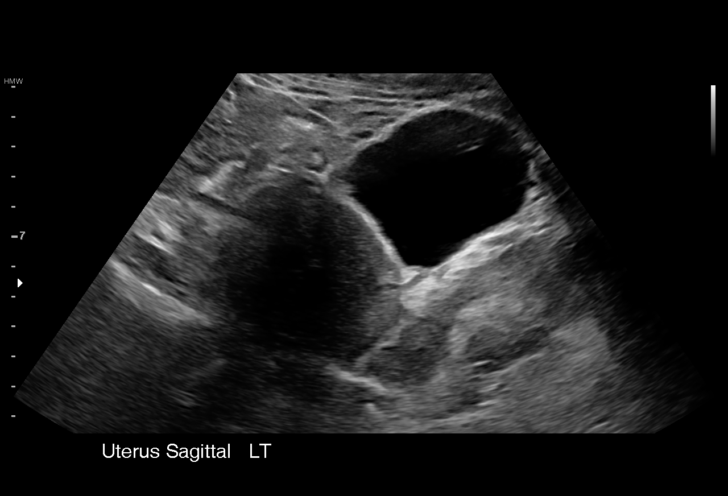
[im 11/48]
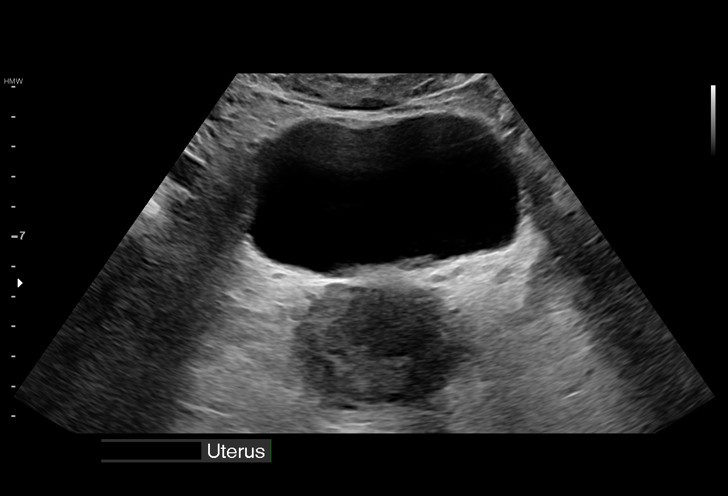
[im 14/48]
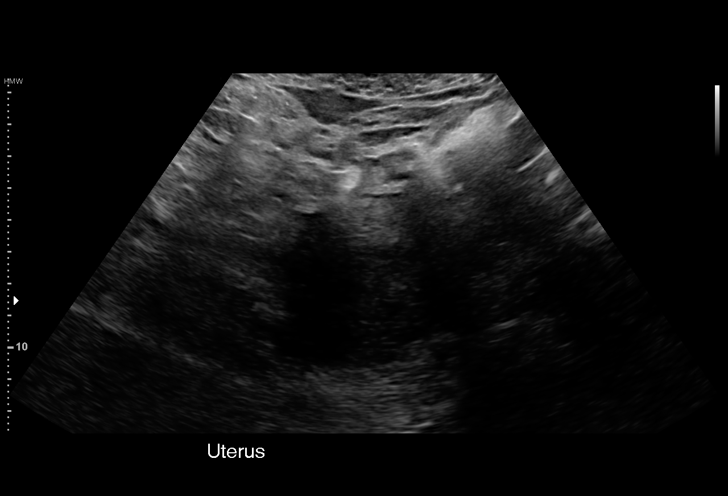
[im 18/48]
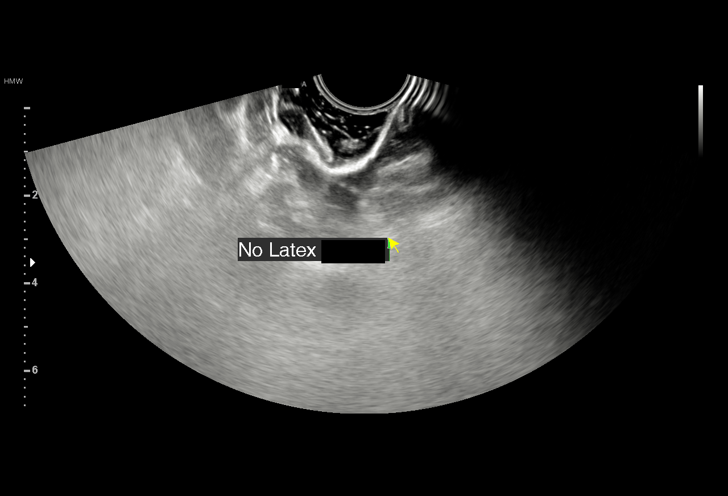
[im 21/48]
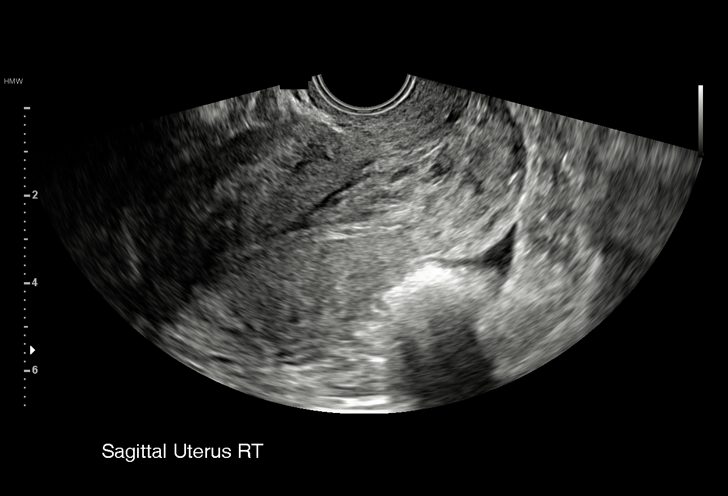
[im 25/48]
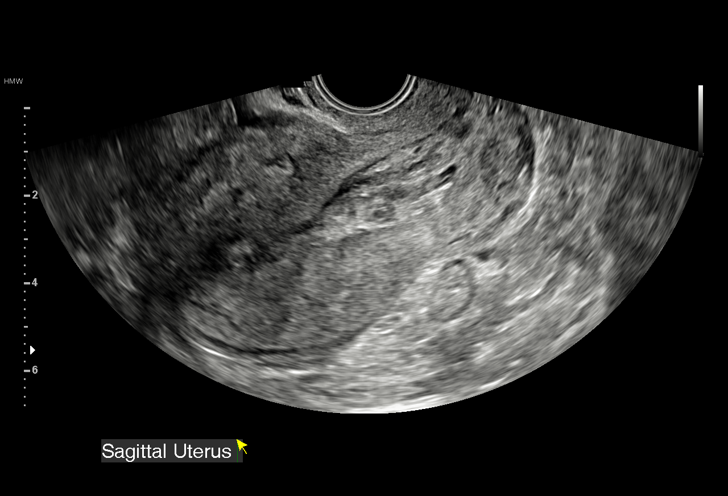
[im 27/48]
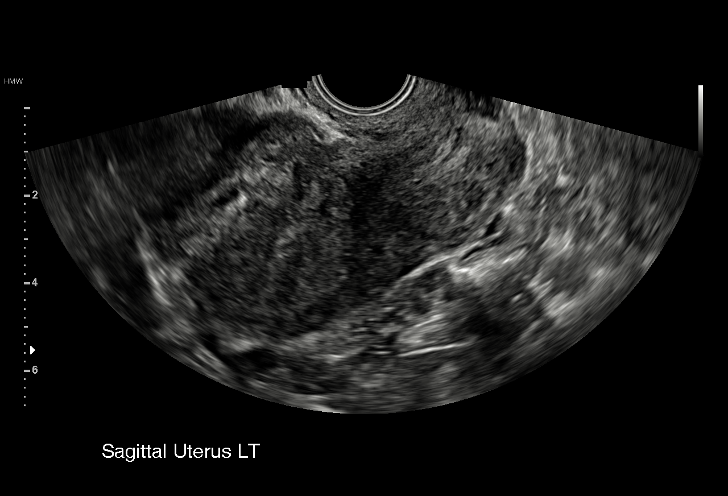
[im 30/48]
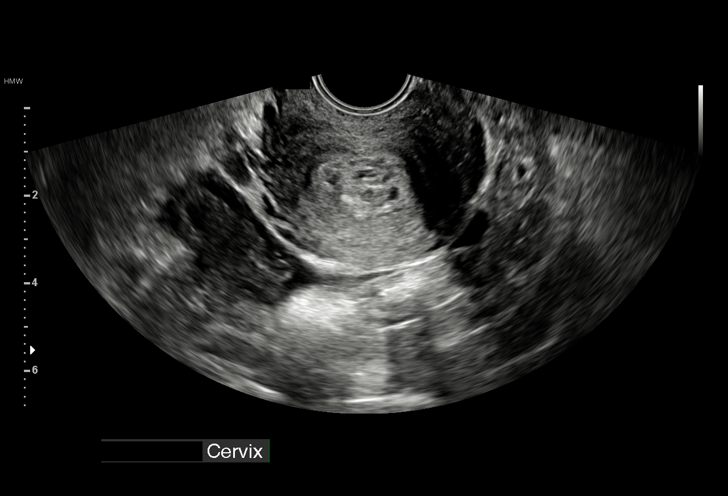
[im 34/48]
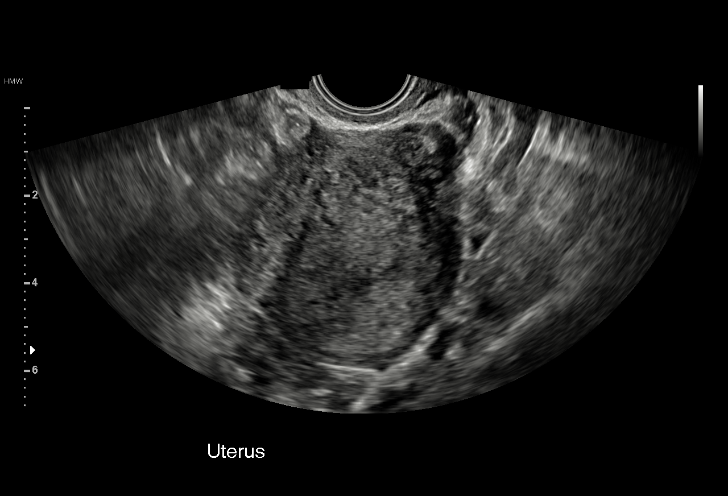
[im 37/48]
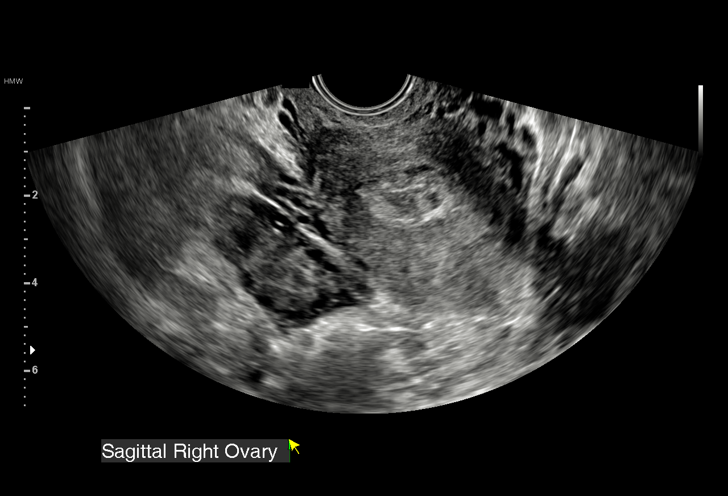
[im 41/48]
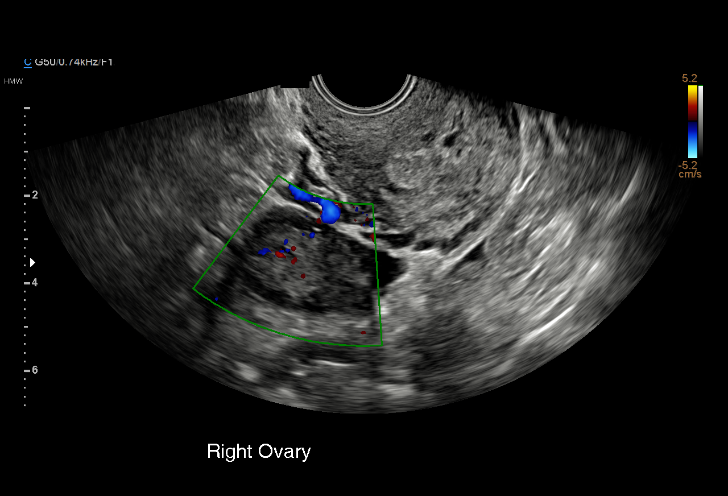
[im 44/48]
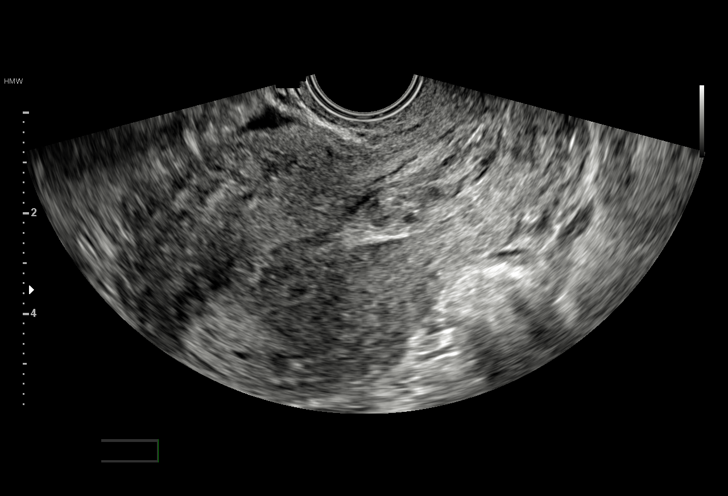
[im 48/48]
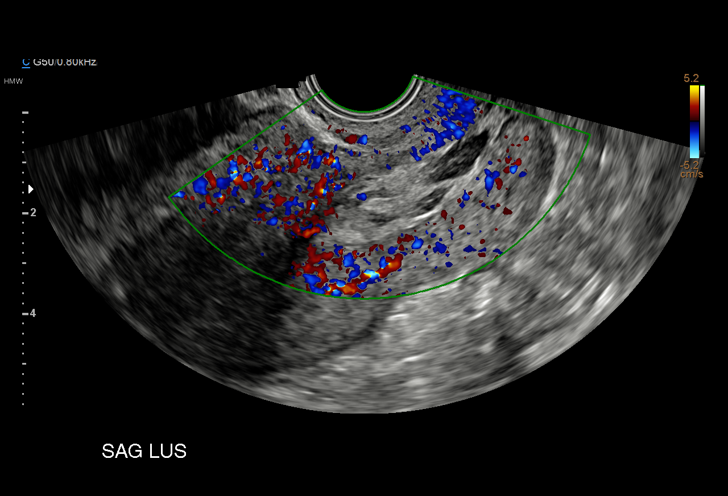

[15 of 28 positions shown; findings below may reference images not displayed]

FINDINGS: Intrauterine gestational sac: None

Yolk sac:  Not Visualized.

Embryo:  Not Visualized.

Subchorionic hemorrhage:  None visualized.

Maternal uterus/adnexae: Heterogeneous soft tissue in the lower
uterine segment, measuring up to 15 mm in thickness.

Right ovary is within normal limits.

Left ovary is not discretely visualized, reportedly surgically
absent.

No free fluid.
IMPRESSION: No IUP is visualized.

Heterogeneous soft tissue in the lower uterine segment, favoring
hemorrhage/debris, measuring up to 15 mm in thickness.

Technically, this reflects a pregnancy of unknown location. However,
the overall appearance favors an abortion in progress. Correlate
with beta HCG and consider repeat/follow-up beta HCG to ensure
appropriate decline.

## 2017-04-04 MED ORDER — METRONIDAZOLE 500 MG PO TABS: 500.0000 mg | ORAL_TABLET | Freq: Two times a day (BID) | ORAL | 0 refills | Status: AC

## 2017-04-04 MED ORDER — IBUPROFEN 200 MG PO TABS: 800.0000 mg | ORAL_TABLET | Freq: Three times a day (TID) | ORAL | 2 refills | Status: DC | PRN

## 2017-04-04 NOTE — MAU Note (Signed)
Here for follow up on miscarriage Still having vaginal bleeding with clots Abdominal pain

## 2017-04-04 NOTE — MAU Provider Note (Signed)
History   26 yo G3P1011 seen at [facility] on 9/13 with dx of likely SAB by [place] (SIUGS at 5 3/7 weeks, no FP, right ovary 3.54 cm CLC, QHCG 8574) presented after calling office to report increased pain and continued bleeding.  Planned repeat QHCG on 03/31/17, but patient unable to keep that appt.  Reports occasional dizziness and weakness.  No pain med today.  Last meal at 11:30a, hamburger.  Also c/o sporadic numbness and "burning" in right thigh.  Denies edema or pain on movement.  Hx removal of left tube and ovary due to recurrent cysts.  Known B+ type.  Patient Active Problem List   Diagnosis Date Noted  . Asthma 04/04/2017  . SAB (spontaneous abortion) 04/04/2017  . H/O unilateral salpingectomy 04/04/2017    Chief Complaint  Patient presents with  . Vaginal Bleeding  . Miscarriage   HPI:  See above  OB History    Gravida Para Term Preterm AB Living   3 1 1   1 1    SAB TAB Ectopic Multiple Live Births     1     1    2013--SVB 2018--TAB at 8 weeks  Past Medical History:  Diagnosis Date  . Ovarian tumor (benign), left     Past Surgical History:  Procedure Laterality Date  . ELBOW SURGERY    . LEFT OOPHORECTOMY  2013    Family History  Problem Relation Age of Onset  . Diabetes Father     Social History  Substance Use Topics  . Smoking status: Never Smoker  . Smokeless tobacco: Never Used  . Alcohol use No    Allergies:  Allergies  Allergen Reactions  . Contrast Media [Iodinated Diagnostic Agents] Itching and Other (See Comments)    Itching on tongue    Prescriptions Prior to Admission  Medication Sig Dispense Refill Last Dose  . Prenatal Vit-Fe Fumarate-FA (PRENATAL MULTIVITAMIN) TABS tablet Take 1 tablet by mouth daily at 12 noon.   10/22/2016 at Unknown time    ROS:  Vaginal bleeding, cramping Physical Exam   Blood pressure 133/86, pulse 91, temperature 98.2 F (36.8 C), temperature source Oral, resp. rate 18, SpO2 100 %, unknown if currently  breastfeeding.  Orthostatics WNL.  Physical Exam  In NAD Chest clear Heart RRR without murmur Abd soft, NT Pelvic--small/moderate amount blood in vault, small clots, minimal active bleeding Ext WNL  ED Course  Assessment: First trimester SAB B+ type BV by patient report  Plan: CBC, QHCG Pelvic US   Donnel Saxon CNM, MSN 04/04/2017 1:09 PM   Addendum: Results for orders placed or performed during the hospital encounter of 04/04/17 (from the past 24 hour(s))  CBC     Status: Abnormal   Collection Time: 04/04/17  1:13 PM  Result Value Ref Range   WBC 8.4 4.0 - 10.5 K/uL   RBC 3.86 (L) 3.87 - 5.11 MIL/uL   Hemoglobin 9.9 (L) 12.0 - 15.0 g/dL   HCT 30.5 (L) 36.0 - 46.0 %   MCV 79.0 78.0 - 100.0 fL   MCH 25.6 (L) 26.0 - 34.0 pg   MCHC 32.5 30.0 - 36.0 g/dL   RDW 14.0 11.5 - 15.5 %   Platelets 399 150 - 400 K/uL  hCG, quantitative, pregnancy     Status: Abnormal   Collection Time: 04/04/17  1:13 PM  Result Value Ref Range   hCG, Beta Chain, Quant, S 345 (H) <5 mIU/mL   [place]:   IMPRESSION: No IUP is visualized.  Heterogeneous soft tissue in the lower uterine segment, favoring hemorrhage/debris, measuring up to 15 mm in thickness.  Technically, this reflects a pregnancy of unknown location. However, the overall appearance favors an abortion in progress. Correlate with beta HCG and consider repeat/follow-up beta HCG to ensure appropriate decline.  Impression: SAB in progress, patient stable. Options for management reviewed--observation, medical management with [employer], or D&E. Patient prefers observation at present. Will repeat [facility] 04/10/17 in office. Rx Ibuprophen 800 mg po TID prn, #30, 2 refills Rx Metronidazole 500 mg po BID x 7 days.  Call with any increase in bleeding or pain. Support to patient for loss Reviewed her questions regarding future conception, implications of TAB this year, closely spaced pregnancies.  Donnel Saxon, CNM 04/04/17 2:51p

## 2017-04-04 NOTE — Discharge Instructions (Signed)

## 2017-04-22 ENCOUNTER — Encounter: Payer: Self-pay | Admitting: Certified Nurse Midwife

## 2017-07-15 NOTE — L&D Delivery Note (Signed)
Delivery Note Patient pushed for less than 5 minutes after she was noted to be C/C/+2.  At 12:59 AM a viable and healthy female was delivered via  (Presentation:OA ).  APGAR: 9/9; weight pending  .   Baby was delivered through a loose nuchal cord.  Shoulders and body easily delivered and baby laid on maternal chest.  Infant noted to be crying vigorously and moving all extremities. Delayed cord clamping was done. The cord Cut by the father.  Cord blood obtained.  Placenta spontaneously delivered intact, 3 vessels noted.   There was notable uterine atony that was not alleviated with IV pitocin and massage so post partum hemorrhage Code was called.  Emergency team came and 1000 micrograms of misoprostol given per rectum and IM Hemabate.  The bladder was empty as the foley bulb was just pulled out just prior to delivery.  Uterus was then firm and the bleeding resolved. The patient received lomotil and tylenol for the side effects of the Hemabate  Which are fever and diarrhea.  Patient tolerated delivery well and remained alert awake and oriented throughout.   Anesthesia:  Epidural Episiotomy: None Lacerations: None Suture Repair: n/a Est. Blood Loss (mL): 301  Mom to postpartum.  Baby to Couplet care / Skin to Skin.  Anna Burns, [place] 04/23/2018, 1:17 AM

## 2017-09-06 ENCOUNTER — Inpatient Hospital Stay (HOSPITAL_COMMUNITY)
Admission: AD | Admit: 2017-09-06 | Discharge: 2017-09-06 | Disposition: A | Discharge status: Home / Self Care | Payer: Medicaid Other | Source: Ambulatory Visit | Attending: Obstetrics and Gynecology | Admitting: Obstetrics and Gynecology

## 2017-09-06 ENCOUNTER — Other Ambulatory Visit: Payer: Self-pay

## 2017-09-06 ENCOUNTER — Encounter (HOSPITAL_COMMUNITY): Payer: Self-pay

## 2017-09-06 DIAGNOSIS — Z3A01 Less than 8 weeks gestation of pregnancy: Secondary | ICD-10-CM | POA: Insufficient documentation

## 2017-09-06 DIAGNOSIS — O219 Vomiting of pregnancy, unspecified: Secondary | ICD-10-CM

## 2017-09-06 DIAGNOSIS — O210 Mild hyperemesis gravidarum: Secondary | ICD-10-CM | POA: Diagnosis not present

## 2017-09-06 LAB — URINALYSIS, ROUTINE W REFLEX MICROSCOPIC
BILIRUBIN URINE: NEGATIVE
Glucose, UA: NEGATIVE mg/dL
Hgb urine dipstick: NEGATIVE
Ketones, ur: NEGATIVE mg/dL
Nitrite: NEGATIVE
PH: 5 (ref 5.0–8.0)
Protein, ur: NEGATIVE mg/dL
SPECIFIC GRAVITY, URINE: 1.021 (ref 1.005–1.030)

## 2017-09-06 LAB — CBC
HEMATOCRIT: 36.9 % (ref 36.0–46.0)
HEMOGLOBIN: 12 g/dL (ref 12.0–15.0)
MCH: 24.8 pg — ABNORMAL LOW (ref 26.0–34.0)
MCHC: 32.5 g/dL (ref 30.0–36.0)
MCV: 76.2 fL — AB (ref 78.0–100.0)
Platelets: 354 10*3/uL (ref 150–400)
RBC: 4.84 MIL/uL (ref 3.87–5.11)
RDW: 14.4 % (ref 11.5–15.5)
WBC: 7.8 10*3/uL (ref 4.0–10.5)

## 2017-09-06 LAB — HCG, QUANTITATIVE, PREGNANCY: hCG, Beta Chain, Quant, S: 376 m[IU]/mL — ABNORMAL HIGH (ref <5)

## 2017-09-06 LAB — POCT PREGNANCY, URINE: PREG TEST UR: POSITIVE — AB

## 2017-09-06 MED ORDER — PROMETHAZINE HCL 12.5 MG PO TABS: 12.5000 mg | ORAL_TABLET | Freq: Four times a day (QID) | ORAL | 0 refills | Status: DC | PRN

## 2017-09-06 NOTE — MAU Note (Signed)
Was notified by admitting patient presented to desk to register, took a phone call versus getting registered promptly.

## 2017-09-06 NOTE — MAU Provider Note (Signed)
History   [id number] early pregnant in with nausea and vomiting of pregnancy. Pt states she can only keep down chili. Also mild pain on right side that is intermittant  CSN: [id number]  Arrival date & time 09/06/17  1130   None     Chief Complaint  Patient presents with  . Abdominal Pain  . Nausea    HPI  Past Medical History:  Diagnosis Date  . Ovarian tumor (benign), left     Past Surgical History:  Procedure Laterality Date  . ELBOW SURGERY    . LEFT OOPHORECTOMY  2013    Family History  Problem Relation Age of Onset  . Diabetes Father   . Diabetes Paternal Grandmother     Social History   Tobacco Use  . Smoking status: Never Smoker  . Smokeless tobacco: Never Used  Substance Use Topics  . Alcohol use: No  . Drug use: No    OB History    Gravida Para Term Preterm AB Living   4 1 1   2 1    SAB TAB Ectopic Multiple Live Births   1 1     1       Review of Systems  Constitutional: Negative.   HENT: Negative.   Eyes: Negative.   Respiratory: Negative.   Cardiovascular: Negative.   Gastrointestinal: Positive for abdominal pain and nausea.  Endocrine: Negative.   Genitourinary: Negative.   Musculoskeletal: Negative.   Skin: Negative.   Allergic/Immunologic: Negative.   Neurological: Negative.   Hematological: Negative.   Psychiatric/Behavioral: Negative.     Allergies  Contrast media [iodinated diagnostic agents]  Home Medications   Current Outpatient Rx  . Order #: [id number] Class: Normal    BP 129/72   Pulse 91   Temp 98.9 F (37.2 C) (Oral)   Resp 18   Ht 5\' 4"  (1.626 m)   Wt 184 lb (83.5 kg)   LMP 08/03/2017   BMI 31.58 kg/m   Physical Exam  Constitutional: She is oriented to person, place, and time. She appears well-developed and well-nourished.  HENT:  Head: Normocephalic.  Eyes: Pupils are equal, round, and reactive to light.  Neck: Normal range of motion.  Cardiovascular: Normal rate, regular rhythm, normal heart sounds and  intact distal pulses.  Pulmonary/Chest: Effort normal and breath sounds normal.  Abdominal: Soft. Bowel sounds are normal.  Musculoskeletal: Normal range of motion.  Neurological: She is alert and oriented to person, place, and time. She has normal reflexes.  Skin: Skin is warm and dry.  Psychiatric: She has a normal mood and affect. Her behavior is normal. Judgment and thought content normal.    MAU Course  Procedures (including critical care time)  Labs Reviewed  URINALYSIS, ROUTINE W REFLEX MICROSCOPIC - Abnormal; Notable for the following components:      Result Value   APPearance HAZY (*)    Leukocytes, UA LARGE (*)    Bacteria, UA RARE (*)    Squamous Epithelial / LPF 6-30 (*)    All other components within normal limits  CBC - Abnormal; Notable for the following components:   MCV 76.2 (*)    MCH 24.8 (*)    All other components within normal limits  HCG, QUANTITATIVE, PREGNANCY - Abnormal; Notable for the following components:   hCG, Beta Chain, Quant, S 376 (*)    All other components within normal limits  POCT PREGNANCY, URINE - Abnormal; Notable for the following components:   Preg Test, Ur POSITIVE (*)  All other components within normal limits   No results found.   1. Nausea and vomiting of pregnancy, antepartum       MDM  VSS, u/a  WNL. Exam normal. Will d/c pt home after speaking to Dr. Charlesetta Garibaldi and she knows to follow up in office Monday for a repeat quant, and to return if any increased pain.

## 2017-09-06 NOTE — Progress Notes (Addendum)
G4P1. Here due to nausea and abd pain on the right side earlier today. Ate Chili in waiting area and tolerated it well. At the moment denies pain. No bleeding.   Hx of MAB and TAB. 1 SVD at 39+ wksga.   HPT + and states missed Feb period. Last LMP was Jan 20.   Today UPT + for pregnancy  MD ordered for labs. Informed pt  1256: Lab notified.  1258: lab at bs  1335: HCG result pending   1357: provider made aware of all lab results. Instructed to have MAU provider to see and d/c pt. CNMs made aware  1405: provider at bs assessing.   D/c instructions given with pt understanding. Pt left unit via ambulatory

## 2017-09-06 NOTE — Discharge Instructions (Signed)

## 2017-09-08 ENCOUNTER — Inpatient Hospital Stay (HOSPITAL_COMMUNITY): Payer: Medicaid Other

## 2017-09-08 ENCOUNTER — Inpatient Hospital Stay (HOSPITAL_COMMUNITY)
Admission: AD | Admit: 2017-09-08 | Discharge: 2017-09-08 | Disposition: A | Discharge status: Home / Self Care | Payer: Medicaid Other | Source: Ambulatory Visit | Attending: Obstetrics & Gynecology | Admitting: Obstetrics & Gynecology

## 2017-09-08 ENCOUNTER — Other Ambulatory Visit: Payer: Self-pay

## 2017-09-08 ENCOUNTER — Encounter (HOSPITAL_COMMUNITY): Payer: Self-pay

## 2017-09-08 DIAGNOSIS — J45909 Unspecified asthma, uncomplicated: Secondary | ICD-10-CM | POA: Diagnosis not present

## 2017-09-08 DIAGNOSIS — O26891 Other specified pregnancy related conditions, first trimester: Secondary | ICD-10-CM | POA: Diagnosis not present

## 2017-09-08 DIAGNOSIS — R509 Fever, unspecified: Secondary | ICD-10-CM | POA: Insufficient documentation

## 2017-09-08 DIAGNOSIS — R102 Pelvic and perineal pain: Secondary | ICD-10-CM | POA: Insufficient documentation

## 2017-09-08 DIAGNOSIS — O99511 Diseases of the respiratory system complicating pregnancy, first trimester: Secondary | ICD-10-CM | POA: Diagnosis not present

## 2017-09-08 DIAGNOSIS — O3680X0 Pregnancy with inconclusive fetal viability, not applicable or unspecified: Secondary | ICD-10-CM | POA: Insufficient documentation

## 2017-09-08 DIAGNOSIS — R109 Unspecified abdominal pain: Secondary | ICD-10-CM

## 2017-09-08 DIAGNOSIS — Z3490 Encounter for supervision of normal pregnancy, unspecified, unspecified trimester: Secondary | ICD-10-CM

## 2017-09-08 DIAGNOSIS — Z3A01 Less than 8 weeks gestation of pregnancy: Secondary | ICD-10-CM | POA: Diagnosis present

## 2017-09-08 DIAGNOSIS — O26899 Other specified pregnancy related conditions, unspecified trimester: Secondary | ICD-10-CM

## 2017-09-08 LAB — URINALYSIS, ROUTINE W REFLEX MICROSCOPIC
Bilirubin Urine: NEGATIVE
Glucose, UA: NEGATIVE mg/dL
HGB URINE DIPSTICK: NEGATIVE
Ketones, ur: NEGATIVE mg/dL
Leukocytes, UA: NEGATIVE
NITRITE: NEGATIVE
PROTEIN: NEGATIVE mg/dL
Specific Gravity, Urine: 1.03 (ref 1.005–1.030)
pH: 8 (ref 5.0–8.0)

## 2017-09-08 LAB — GLUCOSE, CAPILLARY: GLUCOSE-CAPILLARY: 83 mg/dL (ref 65–99)

## 2017-09-08 LAB — CBC WITH DIFFERENTIAL/PLATELET
BASOS ABS: 0 10*3/uL (ref 0.0–0.1)
BASOS PCT: 0 %
EOS ABS: 0.1 10*3/uL (ref 0.0–0.7)
Eosinophils Relative: 2 %
HCT: 36.2 % (ref 36.0–46.0)
HEMOGLOBIN: 11.7 g/dL — AB (ref 12.0–15.0)
Lymphocytes Relative: 29 %
Lymphs Abs: 2.1 10*3/uL (ref 0.7–4.0)
MCH: 24.9 pg — ABNORMAL LOW (ref 26.0–34.0)
MCHC: 32.3 g/dL (ref 30.0–36.0)
MCV: 77.2 fL — ABNORMAL LOW (ref 78.0–100.0)
MONOS PCT: 2 %
Monocytes Absolute: 0.1 10*3/uL (ref 0.1–1.0)
NEUTROS PCT: 67 %
Neutro Abs: 4.8 10*3/uL (ref 1.7–7.7)
Platelets: 395 10*3/uL (ref 150–400)
RBC: 4.69 MIL/uL (ref 3.87–5.11)
RDW: 14.8 % (ref 11.5–15.5)
WBC: 7.1 10*3/uL (ref 4.0–10.5)

## 2017-09-08 LAB — OB RESULTS CONSOLE RUBELLA ANTIBODY, IGM: Rubella: IMMUNE

## 2017-09-08 LAB — HCG, QUANTITATIVE, PREGNANCY: hCG, Beta Chain, Quant, S: 1083 m[IU]/mL — ABNORMAL HIGH (ref <5)

## 2017-09-08 LAB — OB RESULTS CONSOLE HEPATITIS B SURFACE ANTIGEN: Hepatitis B Surface Ag: NEGATIVE

## 2017-09-08 LAB — OB RESULTS CONSOLE RPR: RPR: NONREACTIVE

## 2017-09-08 LAB — OB RESULTS CONSOLE HIV ANTIBODY (ROUTINE TESTING): HIV: NONREACTIVE

## 2017-09-08 IMAGING — US US OB < 14 WEEKS - US OB TV
1 series · 15 of 28 positions shown · non-contrast
Comparison: None.

CLINICAL DATA: Pelvic pain.

EXAM:
OBSTETRIC <14 WK US AND TRANSVAGINAL OB US
TECHNIQUE: Transvaginal ultrasound was performed for complete evaluation of the
gestation as well as the maternal uterus, adnexal regions, and
pelvic cul-de-sac.

[Series 1: us ob < 14 weeks - us ob tv · 47 acquisitions, 15 frames shown]
[im 1/47]
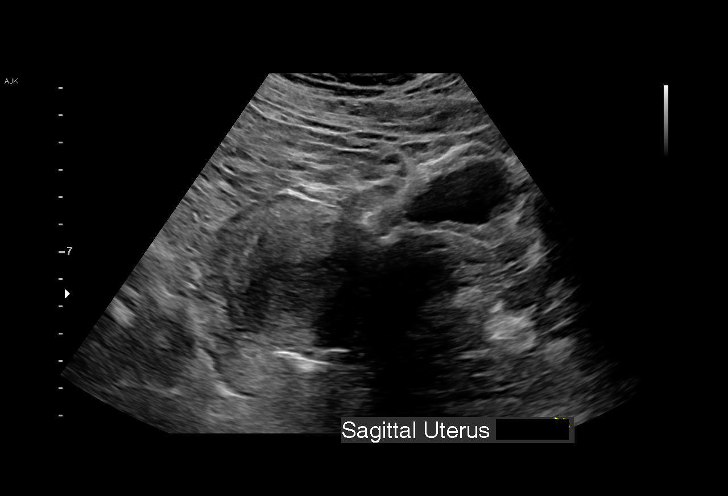
[im 4/47]
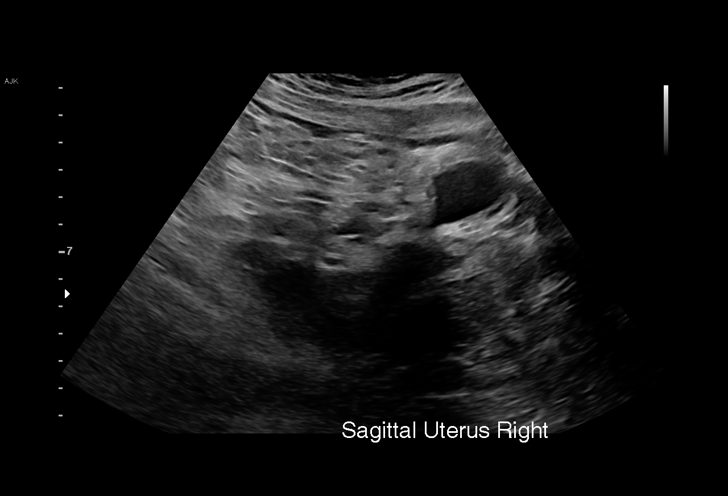
[im 7/47]
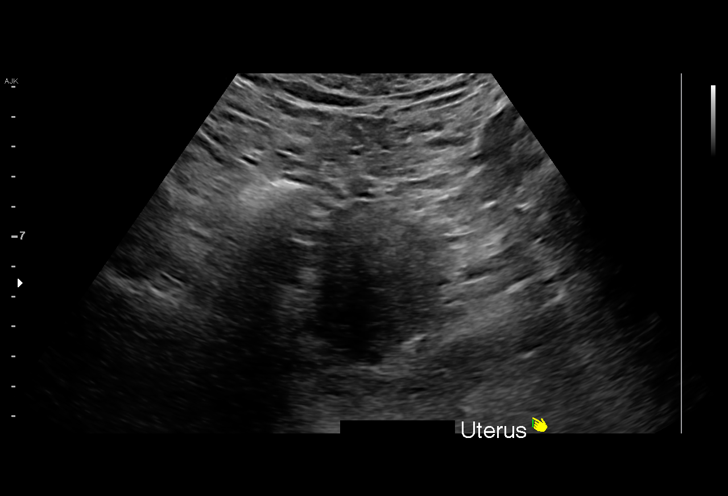
[im 11/47]
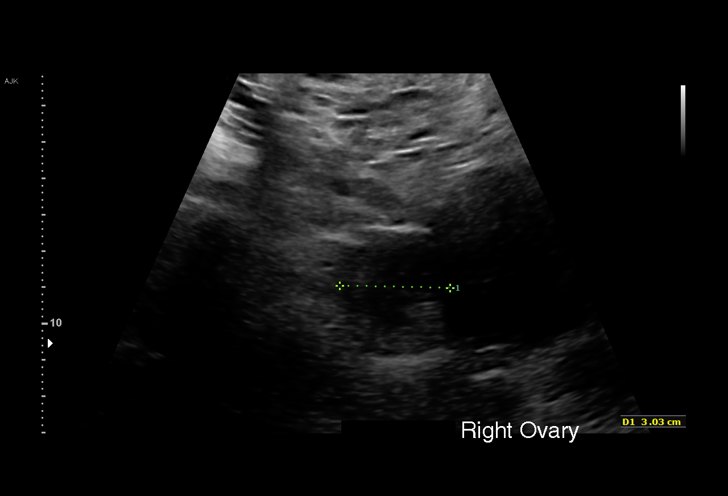
[im 14/47]
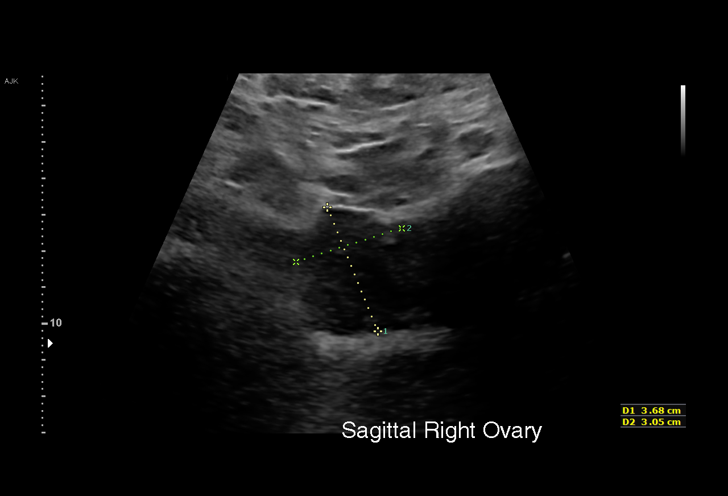
[im 18/47]
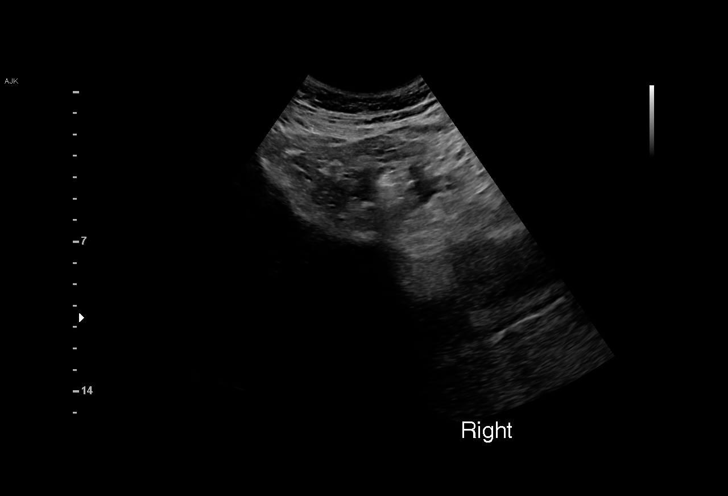
[im 21/47]
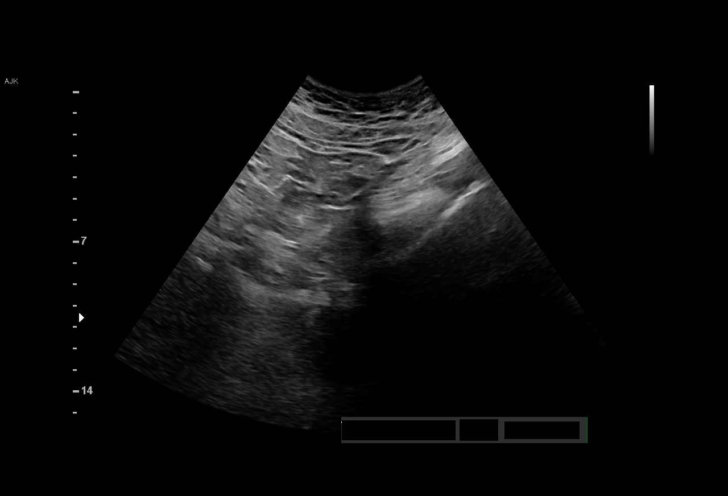
[im 24/47]
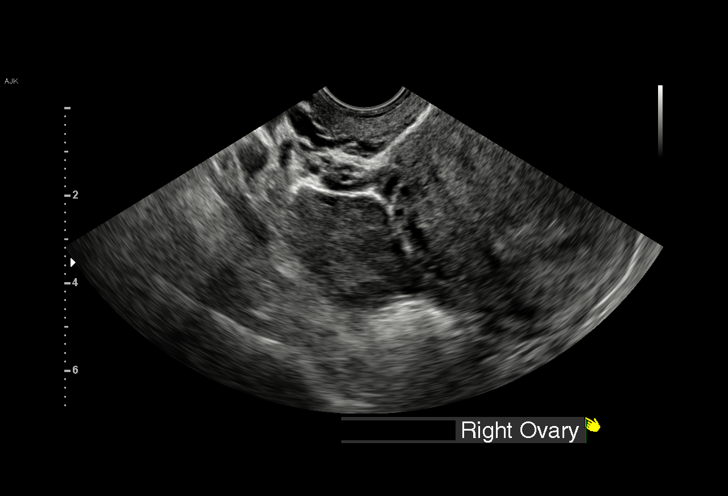
[im 26/47]
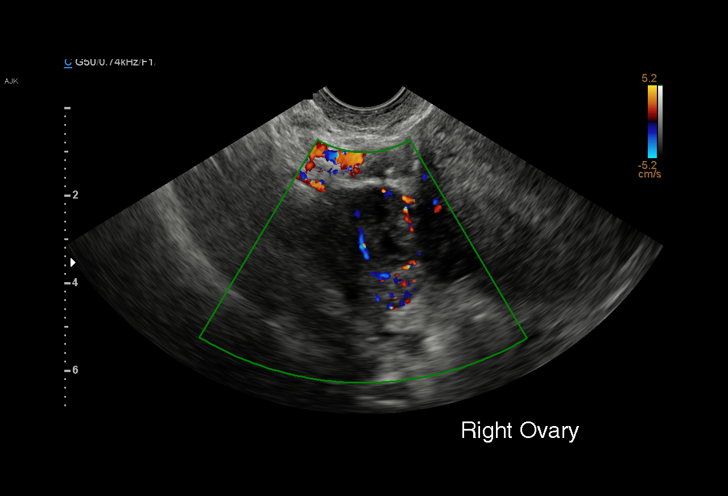
[im 29/47]
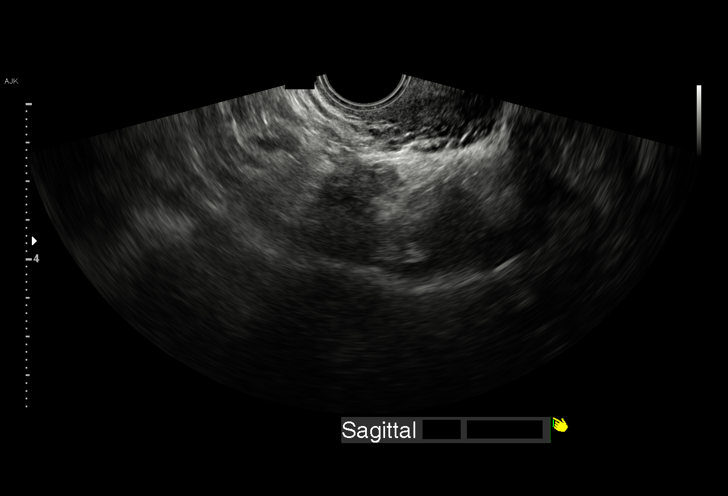
[im 33/47]
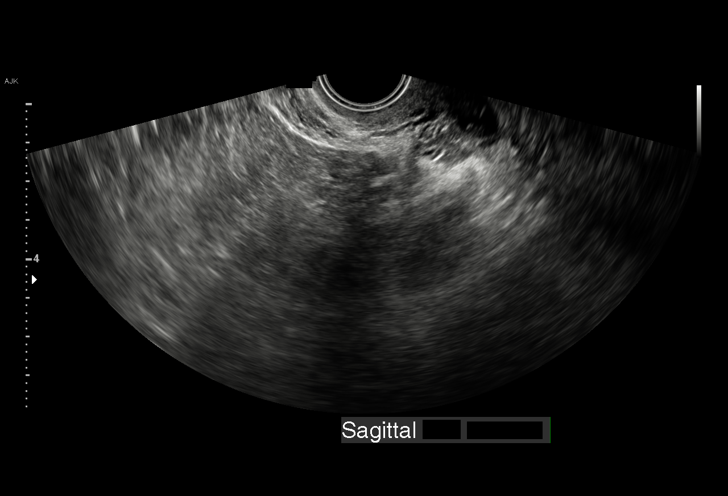
[im 36/47]
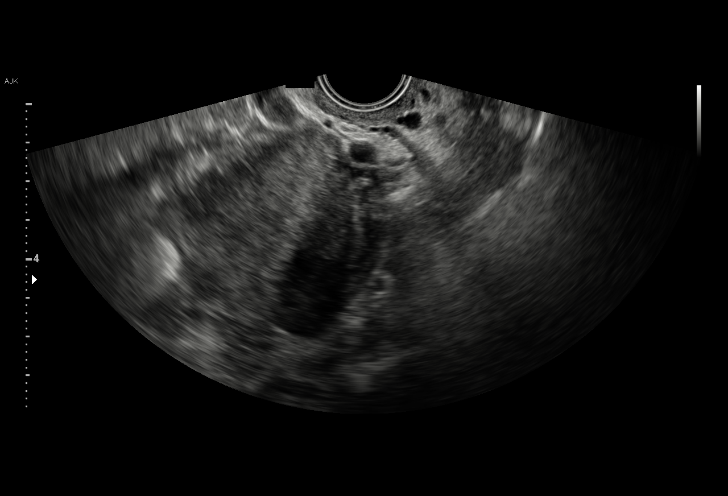
[im 40/47]
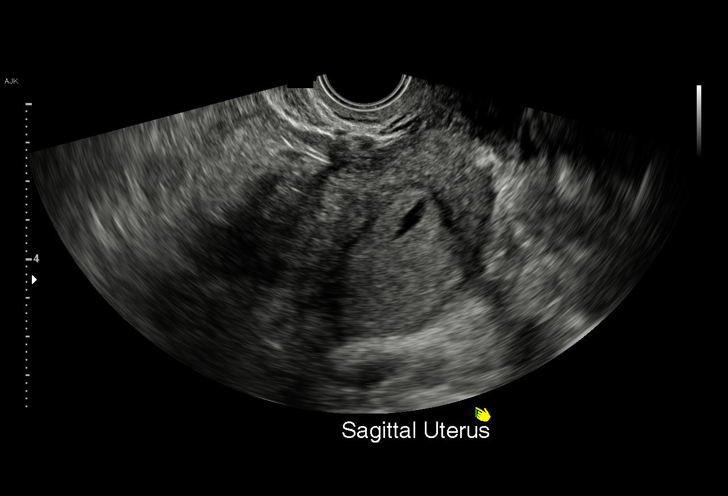
[im 43/47]
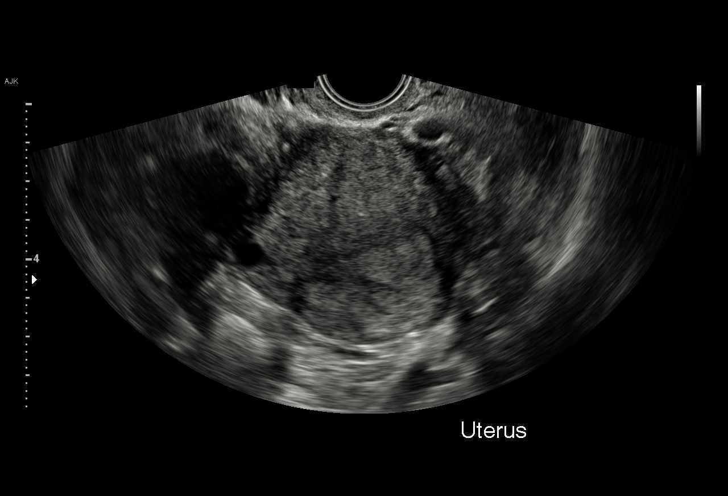
[im 47/47]
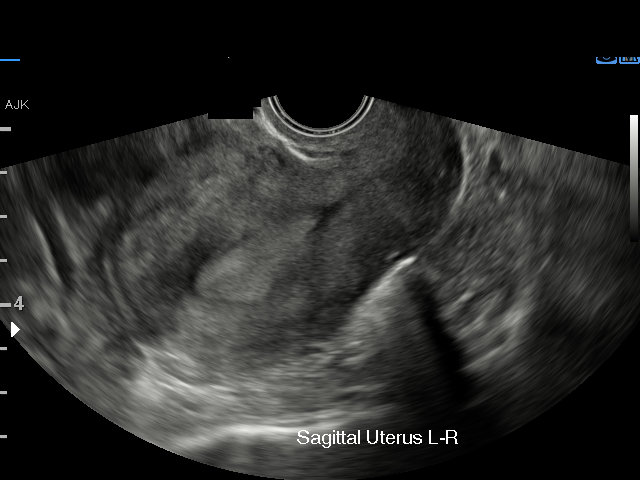

[15 of 28 positions shown; findings below may reference images not displayed]

FINDINGS: Intrauterine gestational sac: None

Yolk sac:  No

Embryo:  No

Cardiac Activity: No

Subchorionic hemorrhage:  None visualized.

Maternal uterus/adnexae:

Right ovary: Normal

Left ovary: Surgically absent

Other :Fluid noted within the endometrial cavity.

Free fluid:  None
IMPRESSION: 1. No intrauterine gestational sac, yolk sac, or fetal pole
identified. Differential considerations include intrauterine
pregnancy too early to be sonographically visualized, missed
abortion, or ectopic pregnancy. Followup ultrasound is recommended
in 10-14 days for further evaluation..

## 2017-09-08 NOTE — MAU Note (Signed)
Urine in lab 

## 2017-09-08 NOTE — Progress Notes (Signed)
Dr. Alesia Richards called regarding patient presenting to MAU. No answer at this time.

## 2017-09-08 NOTE — H&P (Addendum)
History     27 y/o G4P1011 @ 41 W by LMP here c/o abdominal cramps for about 4 days. Intermittent, intensity varies from a 2/10 to 6/10, she has not taken any medication for it.  On further discussion she states pain is more in vagina location, not abdominal. She had a temperature of 100.3  degrees farenheit in office. Repeat tem in office was 99 degrees farenheit.      Patient Active Problem List   Diagnosis Date Noted  . Asthma 04/04/2017  . SAB (spontaneous abortion) 04/04/2017  . H/O unilateral salpingectomy 04/04/2017    Chief Complaint  Patient presents with  . Abdominal Pain  . Fever   HPI  OB History    Gravida Para Term Preterm AB Living   4 1 1   2 1    SAB TAB Ectopic Multiple Live Births   1 1     1       Past Medical History:  Diagnosis Date  . Ovarian tumor (benign), left     Past Surgical History:  Procedure Laterality Date  . ELBOW SURGERY    . LEFT OOPHORECTOMY  2013    Family History  Problem Relation Age of Onset  . Diabetes Father   . Diabetes Paternal Grandmother     Social History   Tobacco Use  . Smoking status: Never Smoker  . Smokeless tobacco: Never Used  Substance Use Topics  . Alcohol use: No  . Drug use: No    Allergies:  Allergies  Allergen Reactions  . Contrast Media [Iodinated Diagnostic Agents] Itching and Other (See Comments)    Itching on tongue    Medications Prior to Admission  Medication Sig Dispense Refill Last Dose  . Prenatal Vit-Fe Fumarate-FA (PRENATAL MULTIVITAMIN) TABS tablet Take 1 tablet by mouth daily at 12 noon.   10/22/2016 at Unknown time  . promethazine (PHENERGAN) 12.5 MG tablet Take 1 tablet (12.5 mg total) by mouth every 6 (six) hours as needed for nausea or vomiting. 30 tablet 0     ROS  Constitutional: Denies fevers/chills/runny nose/sore throat Cardiovascular: Denies chest pain or palpitations Pulmonary: Denies coughing or wheezing Gastrointestinal: Denies nausea, vomiting or  diarrhea Genitourinary: Denies unusual vaginal bleeding, unusual vaginal discharge, dysuria, urgency or frequency. With pelvic pain Musculoskeletal: Denies muscle or joint aches and pain.  Neurology: Denies abnormal sensations such as tingling or numbness.   Physical Exam   Blood pressure 131/76, pulse 85, temperature 98.6 F (37 C), temperature source Oral, resp. rate 16, weight 84 kg (185 lb 4 oz), last menstrual period 08/03/2017, SpO2 100 %, unknown if currently breastfeeding.  Physical Exam Gen: NAD CVS: s1, s2, RRR Pulm: CTAB Abdomen: Soft, non tender abdomen. Bimanual exam: With tenderness over cervical area.  No cervical motion tenderness.  Cervix closed, long.  Speculum: No vulval/vaginal/cervical lesions. Small white cervical discharge.   ED Course Patient was afebrile in the MAU.  She reported her pain was 1/10 intensity.  Her exam was benign.  Lab/imaging tests were done and resulted as below or pending:     Pelvic Ultrasound: 09/08/2017.   IMPRESSION: 1. No intrauterine gestational sac, yolk sac, or fetal pole identified. Differential considerations include intrauterine pregnancy too early to be sonographically visualized, missed abortion, or ectopic pregnancy. Followup ultrasound is recommended in 10-14 days for further evaluation.   Electronically Signed   By: Kerby Moors M.D.   On: 09/08/2017 16:30    Recent Results (from the past 2160 hour(s))  Urinalysis, Routine  w reflex microscopic     Status: Abnormal   Collection Time: 09/06/17 12:00 PM  Result Value Ref Range   Color, Urine YELLOW YELLOW   APPearance HAZY (A) CLEAR   Specific Gravity, Urine 1.021 1.005 - 1.030   pH 5.0 5.0 - 8.0   Glucose, UA NEGATIVE NEGATIVE mg/dL   Hgb urine dipstick NEGATIVE NEGATIVE   Bilirubin Urine NEGATIVE NEGATIVE   Ketones, ur NEGATIVE NEGATIVE mg/dL   Protein, ur NEGATIVE NEGATIVE mg/dL   Nitrite NEGATIVE NEGATIVE   Leukocytes, UA LARGE (A) NEGATIVE   RBC / HPF  0-5 0 - 5 RBC/hpf   WBC, UA 6-30 0 - 5 WBC/hpf   Bacteria, UA RARE (A) NONE SEEN   Squamous Epithelial / LPF 6-30 (A) NONE SEEN   Mucus PRESENT     Comment: Performed at [facility], [address], [place], [place] [address]  Pregnancy, urine POC     Status: Abnormal   Collection Time: 09/06/17 12:05 PM  Result Value Ref Range   Preg Test, Ur POSITIVE (A) NEGATIVE    Comment:        THE SENSITIVITY OF THIS METHODOLOGY IS >24 mIU/mL   CBC     Status: Abnormal   Collection Time: 09/06/17 12:57 PM  Result Value Ref Range   WBC 7.8 4.0 - 10.5 K/uL   RBC 4.84 3.87 - 5.11 MIL/uL   Hemoglobin 12.0 12.0 - 15.0 g/dL   HCT 36.9 36.0 - 46.0 %   MCV 76.2 (L) 78.0 - 100.0 fL   MCH 24.8 (L) 26.0 - 34.0 pg   MCHC 32.5 30.0 - 36.0 g/dL   RDW 14.4 11.5 - 15.5 %   Platelets 354 150 - 400 K/uL    Comment: Performed at [facility], [address] [place], [place] [address]  hCG, quantitative, pregnancy     Status: Abnormal   Collection Time: 09/06/17 12:57 PM  Result Value Ref Range   hCG, Beta Chain, Quant, S 376 (H) <5 mIU/mL    Comment:          GEST. AGE      CONC.  (mIU/mL)   <=1 WEEK        5 - 50     2 WEEKS       50 - 500     3 WEEKS       100 - 10,000     4 WEEKS     1,000 - 30,000     5 WEEKS     3,500 - 115,000   6-8 WEEKS     12,000 - 270,000    12 WEEKS     15,000 - 220,000        FEMALE AND NON-PREGNANT FEMALE:     LESS THAN 5 mIU/mL Performed at [facility], [address] [place], [place] [address]   CBC with Differential/Platelet     Status: Abnormal   Collection Time: 09/08/17  1:57 PM  Result Value Ref Range   WBC 7.1 4.0 - 10.5 K/uL   RBC 4.69 3.87 - 5.11 MIL/uL   Hemoglobin 11.7 (L) 12.0 - 15.0 g/dL   HCT 36.2 36.0 - 46.0 %   MCV 77.2 (L) 78.0 - 100.0 fL   MCH 24.9 (L) 26.0 - 34.0 pg   MCHC 32.3 30.0 - 36.0 g/dL   RDW 14.8 11.5 - 15.5 %   Platelets 395 150 - 400 K/uL   Neutrophils Relative % 67 %   Neutro  Abs 4.8 1.7 - 7.7 K/uL   Lymphocytes  Relative 29 %   Lymphs Abs 2.1 0.7 - 4.0 K/uL   Monocytes Relative 2 %   Monocytes Absolute 0.1 0.1 - 1.0 K/uL   Eosinophils Relative 2 %   Eosinophils Absolute 0.1 0.0 - 0.7 K/uL   Basophils Relative 0 %   Basophils Absolute 0.0 0.0 - 0.1 K/uL    Comment: Performed at [facility], [address], [place], [place] [address]  hCG, quantitative, pregnancy     Status: Abnormal   Collection Time: 09/08/17  1:57 PM  Result Value Ref Range   hCG, Beta Chain, Quant, S 1,083 (H) <5 mIU/mL    Comment:          GEST. AGE      CONC.  (mIU/mL)   <=1 WEEK        5 - 50     2 WEEKS       50 - 500     3 WEEKS       100 - 10,000     4 WEEKS     1,000 - 30,000     5 WEEKS     3,500 - 115,000   6-8 WEEKS     12,000 - 270,000    12 WEEKS     15,000 - 220,000        FEMALE AND NON-PREGNANT FEMALE:     LESS THAN 5 mIU/mL Performed at [facility], [address] [place], [place] [address]    Pending tests: Urine analysis, urine culture, progesterone.   Assessment: 27 y/o G4P1011 @ 36 W with abdominal cramping, likely early pregnancy discomforts, benign exam  Plan: -Discharge to home with advise to use tylenol prn and pain, bleeding precautions. -Pelvic rest advised  -Follow up in office on 09/10/17 for repeat quant HCG testing.  We discussed so far her quant HCGs are rising appropriately but we do not see a pregnancy on ultrasound yet due to HCG levels below threshold. - Follow up in office 09/14/17 for repeat pelvic US and follow up visit.  -Follow up progesterone, urine analysis, urine culture results done in MAU.  -GC/chlam results sent in office pending.  -Time spent on patient 50 minutes hour with at least more than half the time spent on face to face visit with patient.  Alinda Dooms, MD.  09/08/2017 4:48 PM

## 2017-09-08 NOTE — Discharge Instructions (Signed)

## 2017-09-08 NOTE — MAU Note (Signed)
Patient sent from office, sees Dr. Alesia Richards.   Complaining of Right sided abdominal pain, nausea and diarrhea.  Onset of symptoms last Thursday.

## 2017-09-08 NOTE — Progress Notes (Signed)
Report given to Dr. Alesia Richards via phone.  Rn to call MD following [place] with results.  MD to see patient in MAU.

## 2017-09-08 NOTE — MAU Note (Addendum)
Dr sent her here, been having really bad cramping on the LRQ- started last Wed and a low grade fever. (100.3) was unaware of fever. Denies ANY pain at this time, it comes and goes. Has had diarrhea since last Wed.  Sometimes loose, sometimes watery. (VL/CNM put in orders, to call [facility] ? If she will see pt or MAU provider)

## 2017-09-09 LAB — OB RESULTS CONSOLE GC/CHLAMYDIA
CHLAMYDIA, DNA PROBE: NEGATIVE
Gonorrhea: NEGATIVE

## 2017-09-09 LAB — PROGESTERONE: Progesterone: 11 ng/mL

## 2017-09-10 LAB — CULTURE, OB URINE

## 2017-09-11 LAB — OB RESULTS CONSOLE ANTIBODY SCREEN: Antibody Screen: NEGATIVE

## 2017-11-14 ENCOUNTER — Encounter (HOSPITAL_COMMUNITY): Payer: Self-pay | Admitting: *Deleted

## 2017-11-14 ENCOUNTER — Other Ambulatory Visit: Payer: Self-pay

## 2017-11-14 ENCOUNTER — Inpatient Hospital Stay (HOSPITAL_COMMUNITY)
Admission: AD | Admit: 2017-11-14 | Discharge: 2017-11-14 | Disposition: A | Discharge status: Home / Self Care | Payer: Medicaid Other | Source: Ambulatory Visit | Attending: Obstetrics and Gynecology | Admitting: Obstetrics and Gynecology

## 2017-11-14 DIAGNOSIS — R109 Unspecified abdominal pain: Secondary | ICD-10-CM

## 2017-11-14 DIAGNOSIS — O2342 Unspecified infection of urinary tract in pregnancy, second trimester: Secondary | ICD-10-CM

## 2017-11-14 DIAGNOSIS — O26892 Other specified pregnancy related conditions, second trimester: Secondary | ICD-10-CM

## 2017-11-14 DIAGNOSIS — Z3A14 14 weeks gestation of pregnancy: Secondary | ICD-10-CM | POA: Diagnosis not present

## 2017-11-14 DIAGNOSIS — O210 Mild hyperemesis gravidarum: Secondary | ICD-10-CM | POA: Insufficient documentation

## 2017-11-14 DIAGNOSIS — O219 Vomiting of pregnancy, unspecified: Secondary | ICD-10-CM

## 2017-11-14 LAB — URINALYSIS, ROUTINE W REFLEX MICROSCOPIC
Bacteria, UA: NONE SEEN
Bilirubin Urine: NEGATIVE
GLUCOSE, UA: NEGATIVE mg/dL
Hgb urine dipstick: NEGATIVE
KETONES UR: NEGATIVE mg/dL
Nitrite: NEGATIVE
PH: 7 (ref 5.0–8.0)
PROTEIN: NEGATIVE mg/dL
Specific Gravity, Urine: 1.025 (ref 1.005–1.030)

## 2017-11-14 MED ORDER — NITROFURANTOIN MONOHYD MACRO 100 MG PO CAPS: 100.0000 mg | ORAL_CAPSULE | Freq: Two times a day (BID) | ORAL | 0 refills | Status: DC

## 2017-11-14 MED ORDER — PROMETHAZINE HCL 12.5 MG PO TABS: 12.5000 mg | ORAL_TABLET | Freq: Four times a day (QID) | ORAL | 0 refills | Status: DC | PRN

## 2017-11-14 NOTE — MAU Note (Signed)
Pt presents with c/o lower abdominal cramping.  Denies VB.  States diagnosed with UTI & BV, currently taking Keflex & Flagyl, reports vomiting and cramping each time meds are taken.

## 2017-11-14 NOTE — MAU Provider Note (Signed)
Chief Complaint: Abdominal Pain and Emesis   First Provider Initiated Contact with Patient 11/14/17 2019      SUBJECTIVE HPI: Anna Burns is a 27 y.o. [id number] at [redacted]w[redacted]d by LMP who presents to maternity admissions reporting abdominal cramping, nausea, and vomiting since starting medications for UTI and BV. She reports she had abdominal cramping when urinating and was seen at [facility] and prescribed antibiotics for UTI and for BV on 11/10/17. She has not kept most of those doses down because she has nausea and abdominal cramping after each medication is taken.  Her pain is low in her abdomen and mid abdomen, intermittent, cramping and sharp pain.  She reports some intermittent gas pains as well and reports her last bowel movement was 5 days ago.  There are no other associated symptoms. She is taking Zofran for her usual pregnancy nausea but the new medications are making her nausea so bad the medicine is not helping.  She takes Zofran daily. She has not tried any other treatments.    HPI  Past Medical History:  Diagnosis Date  . Ovarian tumor (benign), left    Past Surgical History:  Procedure Laterality Date  . ELBOW SURGERY    . LEFT OOPHORECTOMY  2013   Social History   Socioeconomic History  . Marital status: Single    Spouse name: Not on file  . Number of children: Not on file  . Years of education: Not on file  . Highest education level: Not on file  Occupational History  . Not on file  Social Needs  . Financial resource strain: Not on file  . Food insecurity:    Worry: Not on file    Inability: Not on file  . Transportation needs:    Medical: Not on file    Non-medical: Not on file  Tobacco Use  . Smoking status: Never Smoker  . Smokeless tobacco: Never Used  Substance and Sexual Activity  . Alcohol use: No  . Drug use: No  . Sexual activity: Yes    Birth control/protection: None  Lifestyle  . Physical activity:    Days per week: Not on file     Minutes per session: Not on file  . Stress: Not on file  Relationships  . Social connections:    Talks on phone: Not on file    Gets together: Not on file    Attends religious service: Not on file    Active member of club or organization: Not on file    Attends meetings of clubs or organizations: Not on file    Relationship status: Not on file  . Intimate partner violence:    Fear of current or ex partner: Not on file    Emotionally abused: Not on file    Physically abused: Not on file    Forced sexual activity: Not on file  Other Topics Concern  . Not on file  Social History Narrative  . Not on file   No current facility-administered medications on file prior to encounter.    Current Outpatient Medications on File Prior to Encounter  Medication Sig Dispense Refill  . ondansetron (ZOFRAN) 4 MG tablet Take 4 mg by mouth every 8 (eight) hours as needed for nausea or vomiting.    . Prenatal Vit-Fe Fumarate-FA (PRENATAL MULTIVITAMIN) TABS tablet Take 1 tablet by mouth daily at 12 noon.     Allergies  Allergen Reactions  . Contrast Media [Iodinated Diagnostic Agents] Itching and Other (See Comments)  Itching on tongue    ROS:  Review of Systems  Constitutional: Negative for chills, fatigue and fever.  Respiratory: Negative for shortness of breath.   Cardiovascular: Negative for chest pain.  Gastrointestinal: Positive for abdominal pain, constipation, nausea and vomiting.  Genitourinary: Positive for pelvic pain. Negative for difficulty urinating, dysuria, flank pain, vaginal bleeding, vaginal discharge and vaginal pain.  Neurological: Negative for dizziness and headaches.  Psychiatric/Behavioral: Negative.      I have reviewed patient's Past Medical Hx, Surgical Hx, Family Hx, Social Hx, medications and allergies.   Physical Exam   Patient Vitals for the past 24 hrs:  BP Temp Temp src Pulse Resp SpO2 Height Weight  11/14/17 2101 114/70 - - 89 - - - -  11/14/17  1804 128/75 98.3 F (36.8 C) Oral 96 20 100 % 5\' 3"  (1.6 m) 190 lb 12 oz (86.5 kg)   Constitutional: Well-developed, well-nourished female in no acute distress.  Cardiovascular: normal rate Respiratory: normal effort GI: Abd soft, non-tender. Pos BS x 4 MS: Extremities nontender, no edema, normal ROM Neurologic: Alert and oriented x 4.  GU: Neg CVAT.  Dilation: Closed Effacement (%): Thick Cervical Position: Posterior Exam by:: Fatima Blank, CNM  FHT 145 by doppler  LAB RESULTS Results for orders placed or performed during the hospital encounter of 11/14/17 (from the past 24 hour(s))  Urinalysis, Routine w reflex microscopic     Status: Abnormal   Collection Time: 11/14/17  8:35 PM  Result Value Ref Range   Color, Urine YELLOW YELLOW   APPearance HAZY (A) CLEAR   Specific Gravity, Urine 1.025 1.005 - 1.030   pH 7.0 5.0 - 8.0   Glucose, UA NEGATIVE NEGATIVE mg/dL   Hgb urine dipstick NEGATIVE NEGATIVE   Bilirubin Urine NEGATIVE NEGATIVE   Ketones, ur NEGATIVE NEGATIVE mg/dL   Protein, ur NEGATIVE NEGATIVE mg/dL   Nitrite NEGATIVE NEGATIVE   Leukocytes, UA SMALL (A) NEGATIVE   RBC / HPF 0-5 0 - 5 RBC/hpf   WBC, UA 6-10 0 - 5 WBC/hpf   Bacteria, UA NONE SEEN NONE SEEN   Squamous Epithelial / LPF 0-5 0 - 5   Mucus PRESENT        IMAGING No results found.  MAU Management/MDM: Pt with increased n/v associated with new medications and with pain likely caused by constipation.  No evidence of preterm labor with closed cervix. Consult Dr Charlesetta Garibaldi with assessment and findings.  Pt to D/C Flagyl and switch to Macrobid BID (from Flagyl BID).  Renewed pt Rx for Phenergan and pt to use less Zofran if possible.  Recommend high fiber diet, increased PO fluids, and use of Colace PRN for constipation. F/U in office if symptoms persist, return to MAU for emergencies.  Pt discharged with strict return precautions.  ASSESSMENT 1. UTI (urinary tract infection) during pregnancy,  second trimester   2. Abdominal pain during pregnancy in second trimester   3. Nausea and vomiting during pregnancy prior to [redacted] weeks gestation     PLAN Discharge home Allergies as of 11/14/2017      Reactions   Contrast Media [iodinated Diagnostic Agents] Itching, Other (See Comments)   Itching on tongue      Medication List    TAKE these medications   nitrofurantoin (macrocrystal-monohydrate) 100 MG capsule Commonly known as:  MACROBID Take 1 capsule (100 mg total) by mouth 2 (two) times daily.   ondansetron 4 MG tablet Commonly known as:  ZOFRAN Take 4 mg by mouth every  8 (eight) hours as needed for nausea or vomiting.   prenatal multivitamin Tabs tablet Take 1 tablet by mouth daily at 12 noon.   promethazine 12.5 MG tablet Commonly known as:  PHENERGAN Take 1 tablet (12.5 mg total) by mouth every 6 (six) hours as needed for nausea or vomiting.      Follow-up [facility] Follow up.   Specialty:  Obstetrics and Gynecology Why:  As scheduled, return to MAU as needed for emergencies Contact information: [address] [place] [address] [phone]          Fatima Blank Certified Nurse-Midwife 11/14/2017  9:06 PM

## 2018-02-27 DIAGNOSIS — A6000 Herpesviral infection of urogenital system, unspecified: Secondary | ICD-10-CM | POA: Insufficient documentation

## 2018-02-27 HISTORY — DX: Herpesviral infection of urogenital system, unspecified: A60.00

## 2018-04-07 LAB — OB RESULTS CONSOLE GBS: STREP GROUP B AG: NEGATIVE

## 2018-04-21 ENCOUNTER — Inpatient Hospital Stay (EMERGENCY_DEPARTMENT_HOSPITAL)
Admission: AD | Admit: 2018-04-21 | Discharge: 2018-04-21 | Disposition: A | Discharge status: Home / Self Care | Payer: Medicaid Other | Source: Ambulatory Visit | Attending: Obstetrics and Gynecology | Admitting: Obstetrics and Gynecology

## 2018-04-21 ENCOUNTER — Encounter (HOSPITAL_COMMUNITY): Payer: Self-pay | Admitting: *Deleted

## 2018-04-21 DIAGNOSIS — Z3A37 37 weeks gestation of pregnancy: Secondary | ICD-10-CM

## 2018-04-21 DIAGNOSIS — O471 False labor at or after 37 completed weeks of gestation: Secondary | ICD-10-CM | POA: Insufficient documentation

## 2018-04-21 DIAGNOSIS — O479 False labor, unspecified: Secondary | ICD-10-CM

## 2018-04-21 DIAGNOSIS — O26893 Other specified pregnancy related conditions, third trimester: Secondary | ICD-10-CM | POA: Insufficient documentation

## 2018-04-21 DIAGNOSIS — R030 Elevated blood-pressure reading, without diagnosis of hypertension: Secondary | ICD-10-CM | POA: Insufficient documentation

## 2018-04-21 LAB — COMPREHENSIVE METABOLIC PANEL
ALK PHOS: 91 U/L (ref 38–126)
ALT: 10 U/L (ref 0–44)
AST: 18 U/L (ref 15–41)
Albumin: 3 g/dL — ABNORMAL LOW (ref 3.5–5.0)
Anion gap: 9 (ref 5–15)
BILIRUBIN TOTAL: 0.1 mg/dL — AB (ref 0.3–1.2)
BUN: 6 mg/dL (ref 6–20)
CALCIUM: 8.8 mg/dL — AB (ref 8.9–10.3)
CO2: 21 mmol/L — ABNORMAL LOW (ref 22–32)
Chloride: 108 mmol/L (ref 98–111)
Creatinine, Ser: 0.46 mg/dL (ref 0.44–1.00)
GFR calc non Af Amer: >60 mL/min (ref >60)
Glucose, Bld: 127 mg/dL — ABNORMAL HIGH (ref 70–99)
Potassium: 3.3 mmol/L — ABNORMAL LOW (ref 3.5–5.1)
Sodium: 138 mmol/L (ref 135–145)
TOTAL PROTEIN: 7.1 g/dL (ref 6.5–8.1)

## 2018-04-21 LAB — CBC
HCT: 29.7 % — ABNORMAL LOW (ref 36.0–46.0)
HEMOGLOBIN: 9.6 g/dL — AB (ref 12.0–15.0)
MCH: 23.9 pg — AB (ref 26.0–34.0)
MCHC: 32.3 g/dL (ref 30.0–36.0)
MCV: 74.1 fL — ABNORMAL LOW (ref 80.0–100.0)
Platelets: 246 10*3/uL (ref 150–400)
RBC: 4.01 MIL/uL (ref 3.87–5.11)
RDW: 14.5 % (ref 11.5–15.5)
WBC: 10 10*3/uL (ref 4.0–10.5)
nRBC: 0 % (ref 0.0–0.2)

## 2018-04-21 LAB — PROTEIN / CREATININE RATIO, URINE
CREATININE, URINE: 56 mg/dL
PROTEIN CREATININE RATIO: 0.11 mg/mg{creat} (ref 0.00–0.15)
TOTAL PROTEIN, URINE: 6 mg/dL

## 2018-04-21 NOTE — MAU Note (Signed)
I have communicated with Robyne Askew NP  and reviewed vital signs:  Vitals:   04/21/18 2000 04/21/18 2024  BP: (!) 138/91 (!) 138/91  Pulse: 97   Resp:    Temp:      Vaginal exam:  Dilation: 4 Effacement (%): 50 Cervical Position: Posterior Station: -2 Presentation: Vertex Exam by:: TLYTLE RN,   Also reviewed contraction pattern and that non-stress test is reactive.  It has been documented that patient is contracting every irregularly with no cervical change over 2 hours not indicating active labor.  Patient denies any other complaints.  Based on this report provider has given order for discharge.  A discharge order and diagnosis entered by a provider.   Labor discharge instructions reviewed with patient.

## 2018-04-21 NOTE — Progress Notes (Signed)
MD states MAU provider to manage care, but to notify her pt requires admission.

## 2018-04-21 NOTE — MAU Provider Note (Signed)
Chief Complaint  Patient presents with  . Contractions     First Provider Initiated Contact with Patient 04/21/18 1900      S: Anna Burns  is a 27 y.o. y.o. year old [id number] female at [redacted]w[redacted]d weeks gestation who presents to MAU for labor evaluation. While here has had elevated BPs. Denies any history of high blood pressure.  Denies LOF or vaginal bleeding. Positive fetal movement  Associated symptoms: denies Headache, denies vision changes, denies epigastric pain Contractions: ctx on monitor in office today but states she hasn't been feeling them Vaginal bleeding: denies Fetal movement: normal  O:  Patient Vitals for the past 24 hrs:  BP Temp Temp src Pulse Resp Weight  04/21/18 1946 (!) 143/84 - - (!) 101 - -  04/21/18 1931 138/81 - - 93 - -  04/21/18 1916 138/82 - - (!) 104 - -  04/21/18 1900 134/81 - - 98 - -  04/21/18 1852 134/78 - - 100 - -  04/21/18 1831 137/87 - - (!) 102 - -  04/21/18 1820 (!) 135/91 - - (!) 113 - -  04/21/18 1752 (!) 142/88 98.5 F (36.9 C) Oral (!) 105 16 94.7 kg   General: NAD Heart: Regular rate Lungs: Normal rate and effort Abd: Soft, NT, Gravid, S=D Neuro: 2+ deep tendon reflexes, No clonus Pelvic: NEFG, no bleeding or LOF.   Dilation: 4 Effacement (%): 50 Cervical Position: Posterior Station: -2 Presentation: Vertex Exam by:: Truitt Leep, RNC  NST:  Baseline: 140 bpm, Variability: Good {> 6 bpm), Accelerations: Reactive and Decelerations: Absent  Results for orders placed or performed during the hospital encounter of 04/21/18 (from the past 24 hour(s))  Protein / creatinine ratio, urine     Status: None   Collection Time: 04/21/18  6:12 PM  Result Value Ref Range   Creatinine, Urine 56.00 mg/dL   Total Protein, Urine 6 mg/dL   Protein Creatinine Ratio 0.11 0.00 - 0.15 mg/mg[Cre]  CBC     Status: Abnormal   Collection Time: 04/21/18  6:49 PM  Result Value Ref Range   WBC 10.0 4.0 - 10.5 K/uL   RBC 4.01 3.87 - 5.11 MIL/uL   Hemoglobin 9.6 (L) 12.0 - 15.0 g/dL   HCT 29.7 (L) 36.0 - 46.0 %   MCV 74.1 (L) 80.0 - 100.0 fL   MCH 23.9 (L) 26.0 - 34.0 pg   MCHC 32.3 30.0 - 36.0 g/dL   RDW 14.5 11.5 - 15.5 %   Platelets 246 150 - 400 K/uL   nRBC 0.0 0.0 - 0.2 %  Comprehensive metabolic panel     Status: Abnormal   Collection Time: 04/21/18  6:49 PM  Result Value Ref Range   Sodium 138 135 - 145 mmol/L   Potassium 3.3 (L) 3.5 - 5.1 mmol/L   Chloride 108 98 - 111 mmol/L   CO2 21 (L) 22 - 32 mmol/L   Glucose, Bld 127 (H) 70 - 99 mg/dL   BUN 6 6 - 20 mg/dL   Creatinine, Ser 0.46 0.44 - 1.00 mg/dL   Calcium 8.8 (L) 8.9 - 10.3 mg/dL   Total Protein 7.1 6.5 - 8.1 g/dL   Albumin 3.0 (L) 3.5 - 5.0 g/dL   AST 18 15 - 41 U/L   ALT 10 0 - 44 U/L   Alkaline Phosphatase 91 38 - 126 U/L   Total Bilirubin 0.1 (L) 0.3 - 1.2 mg/dL   GFR calc non Af Amer >60 >60 mL/min   GFR  calc Af Amer >60 >60 mL/min   Anion gap 9 5 - 15    A: [redacted]w[redacted]d week IUP 1. False labor   2. [redacted] weeks gestation of pregnancy   3. Elevated BP without diagnosis of hypertension      P: Discharge home in stable condition per consult with Dr. Alwyn Pea Preeclampsia precautions. Follow-up for blood pressure check later this week at your doctor's office or sooner as needed if symptoms worsen. Return to maternity admissions as needed in emergencies  Jorje Guild, NP 04/21/2018 7:51 PM

## 2018-04-21 NOTE — Discharge Instructions (Signed)
Fetal Movement Counts Patient Name: ________________________________________________ Patient Due Date: ____________________ What is a fetal movement count? A fetal movement count is the number of times that you feel your baby move during a certain amount of time. This may also be called a fetal kick count. A fetal movement count is recommended for every pregnant woman. You may be asked to start counting fetal movements as early as week 28 of your pregnancy. Pay attention to when your baby is most active. You may notice your baby's sleep and wake cycles. You may also notice things that make your baby move more. You should do a fetal movement count:  When your baby is normally most active.  At the same time each day.  A good time to count movements is while you are resting, after having something to eat and drink. How do I count fetal movements? 1. Find a quiet, comfortable area. Sit, or lie down on your side. 2. Write down the date, the start time and stop time, and the number of movements that you felt between those two times. Take this information with you to your health care visits. 3. For 2 hours, count kicks, flutters, swishes, rolls, and jabs. You should feel at least 10 movements during 2 hours. 4. You may stop counting after you have felt 10 movements. 5. If you do not feel 10 movements in 2 hours, have something to eat and drink. Then, keep resting and counting for 1 hour. If you feel at least 4 movements during that hour, you may stop counting. Contact a health care provider if:  You feel fewer than 4 movements in 2 hours.  Your baby is not moving like he or she usually does. Date: ____________ Start time: ____________ Stop time: ____________ Movements: ____________ Date: ____________ Start time: ____________ Stop time: ____________ Movements: ____________ Date: ____________ Start time: ____________ Stop time: ____________ Movements: ____________ Date: ____________ Start time:  ____________ Stop time: ____________ Movements: ____________ Date: ____________ Start time: ____________ Stop time: ____________ Movements: ____________ Date: ____________ Start time: ____________ Stop time: ____________ Movements: ____________ Date: ____________ Start time: ____________ Stop time: ____________ Movements: ____________ Date: ____________ Start time: ____________ Stop time: ____________ Movements: ____________ Date: ____________ Start time: ____________ Stop time: ____________ Movements: ____________ This information is not intended to replace advice given to you by your health care provider. Make sure you discuss any questions you have with your health care provider. Document Released: 07/31/2006 Document Revised: 02/28/2016 Document Reviewed: 08/10/2015 Elsevier Interactive Patient Education  2018 [employer].    Hypertension During Pregnancy Hypertension, commonly called high blood pressure, is when the force of blood pumping through your arteries is too strong. Arteries are blood vessels that carry blood from the heart throughout the body. Hypertension during pregnancy can cause problems for you and your baby. Your baby may be born early (prematurely) or may not weigh as much as he or she should at birth. Very bad cases of hypertension during pregnancy can be life-threatening. Different types of hypertension can occur during pregnancy. These include:  Chronic hypertension. This happens when: ? You have hypertension before pregnancy and it continues during pregnancy. ? You develop hypertension before you are [redacted] weeks pregnant, and it continues during pregnancy.  Gestational hypertension. This is hypertension that develops after the 20th week of pregnancy.  Preeclampsia, also called toxemia of pregnancy. This is a very serious type of hypertension that develops only during pregnancy. It affects the whole body, and it can be very dangerous for you and  your baby.  Gestational  hypertension and preeclampsia usually go away within 6 weeks after your baby is born. Women who have hypertension during pregnancy have a greater chance of developing hypertension later in life or during future pregnancies. What are the causes? The exact cause of hypertension is not known. What increases the risk? There are certain factors that make it more likely for you to develop hypertension during pregnancy. These include:  Having hypertension during a previous pregnancy or prior to pregnancy.  Being overweight.  Being older than age 69.  Being pregnant for the first time or being pregnant with more than one baby.  Becoming pregnant using fertilization methods such as IVF (in vitro fertilization).  Having diabetes, kidney problems, or systemic lupus erythematosus.  Having a family history of hypertension.  What are the signs or symptoms? Chronic hypertension and gestational hypertension rarely cause symptoms. Preeclampsia causes symptoms, which may include:  Increased protein in your urine. Your health care provider will check for this at every visit before you give birth (prenatal visit).  Severe headaches.  Sudden weight gain.  Swelling of the hands, face, legs, and feet.  Nausea and vomiting.  Vision problems, such as blurred or double vision.  Numbness in the face, arms, legs, and feet.  Dizziness.  Slurred speech.  Sensitivity to bright lights.  Abdominal pain.  Convulsions.  How is this diagnosed? You may be diagnosed with hypertension during a routine prenatal exam. At each prenatal visit, you may:  Have a urine test to check for high amounts of protein in your urine.  Have your blood pressure checked. A blood pressure reading is recorded as two numbers, such as "120 over 80" (or 120/80). The first ("top") number is called the systolic pressure. It is a measure of the pressure in your arteries when your heart beats. The second ("bottom") number is  called the diastolic pressure. It is a measure of the pressure in your arteries as your heart relaxes between beats. Blood pressure is measured in a unit called mm Hg. A normal blood pressure reading is: ? Systolic: below 540. ? Diastolic: below 80.  The type of hypertension that you are diagnosed with depends on your test results and when your symptoms developed.  Chronic hypertension is usually diagnosed before 20 weeks of pregnancy.  Gestational hypertension is usually diagnosed after 20 weeks of pregnancy.  Hypertension with high amounts of protein in the urine is diagnosed as preeclampsia.  Blood pressure measurements that stay above 086 systolic, or above 761 diastolic, are signs of severe preeclampsia.  How is this treated? Treatment for hypertension during pregnancy varies depending on the type of hypertension you have and how serious it is.  If you take medicines called ACE inhibitors to treat chronic hypertension, you may need to switch medicines. ACE inhibitors should not be taken during pregnancy.  If you have gestational hypertension, you may need to take blood pressure medicine.  If you are at risk for preeclampsia, your health care provider may recommend that you take a low-dose aspirin every day to prevent high blood pressure during your pregnancy.  If you have severe preeclampsia, you may need to be hospitalized so you and your baby can be monitored closely. You may also need to take medicine (magnesium sulfate) to prevent seizures and to lower blood pressure. This medicine may be given as an injection or through an IV tube.  In some cases, if your condition gets worse, you may need to deliver your baby  early.  Follow these instructions at home: Eating and drinking  Drink enough fluid to keep your urine clear or pale yellow.  Eat a healthy diet that is low in salt (sodium). Do not add salt to your food. Check food labels to see how much sodium a food or beverage  contains. Lifestyle  Do not use any products that contain nicotine or tobacco, such as cigarettes and e-cigarettes. If you need help quitting, ask your health care provider.  Do not use alcohol.  Avoid caffeine.  Avoid stress as much as possible. Rest and get plenty of sleep. General instructions  Take over-the-counter and prescription medicines only as told by your health care provider.  While lying down, lie on your left side. This keeps pressure off your baby.  While sitting or lying down, raise (elevate) your feet. Try putting some pillows under your lower legs.  Exercise regularly. Ask your health care provider what kinds of exercise are best for you.  Keep all prenatal and follow-up visits as told by your health care provider. This is important. Contact a health care provider if:  You have symptoms that your health care provider told you may require more treatment or monitoring, such as: ? Fever. ? Vomiting. ? Headache. Get help right away if:  You have severe abdominal pain or vomiting that does not get better with treatment.  You suddenly develop swelling in your hands, ankles, or face.  You gain 4 lbs (1.8 kg) or more in 1 week.  You develop vaginal bleeding, or you have blood in your urine.  You do not feel your baby moving as much as usual.  You have blurred or double vision.  You have muscle twitching or sudden tightening (spasms).  You have shortness of breath.  Your lips or fingernails turn blue. This information is not intended to replace advice given to you by your health care provider. Make sure you discuss any questions you have with your health care provider. Document Released: 03/19/2011 Document Revised: 01/19/2016 Document Reviewed: 12/15/2015 Elsevier Interactive Patient Education  [employer].

## 2018-04-21 NOTE — MAU Note (Signed)
Ctx every 2-3, states she was on the monitor at the office and was unaware she was ctx. Is starting to feel them now  Unsure about LOF, no bleeding, +FM

## 2018-04-21 NOTE — MAU Note (Signed)
Urine sent to lab 

## 2018-04-22 ENCOUNTER — Inpatient Hospital Stay (HOSPITAL_COMMUNITY): Payer: Medicaid Other | Admitting: Anesthesiology

## 2018-04-22 ENCOUNTER — Inpatient Hospital Stay (HOSPITAL_COMMUNITY)
Admission: AD | Admit: 2018-04-22 | Discharge: 2018-04-25 | DRG: 806 | Disposition: A | Discharge status: Home / Self Care | Payer: Medicaid Other | Attending: Obstetrics & Gynecology | Admitting: Obstetrics & Gynecology

## 2018-04-22 ENCOUNTER — Encounter (HOSPITAL_COMMUNITY): Payer: Self-pay | Admitting: *Deleted

## 2018-04-22 ENCOUNTER — Other Ambulatory Visit: Payer: Self-pay

## 2018-04-22 DIAGNOSIS — O139 Gestational [pregnancy-induced] hypertension without significant proteinuria, unspecified trimester: Secondary | ICD-10-CM | POA: Diagnosis present

## 2018-04-22 DIAGNOSIS — O134 Gestational [pregnancy-induced] hypertension without significant proteinuria, complicating childbirth: Principal | ICD-10-CM | POA: Diagnosis present

## 2018-04-22 DIAGNOSIS — O9832 Other infections with a predominantly sexual mode of transmission complicating childbirth: Secondary | ICD-10-CM | POA: Diagnosis present

## 2018-04-22 DIAGNOSIS — D649 Anemia, unspecified: Secondary | ICD-10-CM | POA: Diagnosis present

## 2018-04-22 DIAGNOSIS — Z3A37 37 weeks gestation of pregnancy: Secondary | ICD-10-CM | POA: Diagnosis not present

## 2018-04-22 DIAGNOSIS — O9902 Anemia complicating childbirth: Secondary | ICD-10-CM | POA: Diagnosis present

## 2018-04-22 DIAGNOSIS — O479 False labor, unspecified: Secondary | ICD-10-CM | POA: Diagnosis not present

## 2018-04-22 DIAGNOSIS — A6000 Herpesviral infection of urogenital system, unspecified: Secondary | ICD-10-CM | POA: Diagnosis present

## 2018-04-22 HISTORY — DX: Gestational (pregnancy-induced) hypertension without significant proteinuria, unspecified trimester: O13.9

## 2018-04-22 LAB — URIC ACID: URIC ACID, SERUM: 4.2 mg/dL (ref 2.5–7.1)

## 2018-04-22 LAB — COMPREHENSIVE METABOLIC PANEL
ALT: 10 U/L (ref 0–44)
ANION GAP: 10 (ref 5–15)
AST: 16 U/L (ref 15–41)
Albumin: 3.1 g/dL — ABNORMAL LOW (ref 3.5–5.0)
Alkaline Phosphatase: 93 U/L (ref 38–126)
CO2: 20 mmol/L — AB (ref 22–32)
Calcium: 9 mg/dL (ref 8.9–10.3)
Chloride: 107 mmol/L (ref 98–111)
Creatinine, Ser: 0.4 mg/dL — ABNORMAL LOW (ref 0.44–1.00)
GFR calc Af Amer: >60 mL/min (ref >60)
GFR calc non Af Amer: >60 mL/min (ref >60)
Glucose, Bld: 77 mg/dL (ref 70–99)
POTASSIUM: 3.8 mmol/L (ref 3.5–5.1)
SODIUM: 137 mmol/L (ref 135–145)
Total Bilirubin: 0.2 mg/dL — ABNORMAL LOW (ref 0.3–1.2)
Total Protein: 7.2 g/dL (ref 6.5–8.1)

## 2018-04-22 LAB — LACTATE DEHYDROGENASE: LDH: 162 U/L (ref 98–192)

## 2018-04-22 LAB — CBC
HCT: 30.1 % — ABNORMAL LOW (ref 36.0–46.0)
Hemoglobin: 9.6 g/dL — ABNORMAL LOW (ref 12.0–15.0)
MCH: 23.7 pg — AB (ref 26.0–34.0)
MCHC: 31.9 g/dL (ref 30.0–36.0)
MCV: 74.3 fL — AB (ref 80.0–100.0)
Platelets: 257 10*3/uL (ref 150–400)
RBC: 4.05 MIL/uL (ref 3.87–5.11)
RDW: 14.5 % (ref 11.5–15.5)
WBC: 9.4 10*3/uL (ref 4.0–10.5)
nRBC: 0 % (ref 0.0–0.2)

## 2018-04-22 LAB — PROTEIN / CREATININE RATIO, URINE
Creatinine, Urine: 62 mg/dL
PROTEIN CREATININE RATIO: 0.1 mg/mg{creat} (ref 0.00–0.15)
Total Protein, Urine: 6 mg/dL

## 2018-04-22 MED ORDER — SODIUM CHLORIDE 0.9 % IV SOLN: 250.0000 mL | INTRAVENOUS | Status: DC | PRN

## 2018-04-22 MED ORDER — TERBUTALINE SULFATE 1 MG/ML IJ SOLN
0.2500 mg | Freq: Once | INTRAMUSCULAR | Status: DC | PRN
  Filled 2018-04-22: qty 1

## 2018-04-22 MED ORDER — LACTATED RINGERS IV SOLN: 500.0000 mL | INTRAVENOUS | Status: DC | PRN

## 2018-04-22 MED ORDER — PHENYLEPHRINE 40 MCG/ML (10ML) SYRINGE FOR IV PUSH (FOR BLOOD PRESSURE SUPPORT)
80.0000 ug | PREFILLED_SYRINGE | INTRAVENOUS | Status: DC | PRN
  Filled 2018-04-22: qty 5

## 2018-04-22 MED ORDER — FENTANYL 2.5 MCG/ML BUPIVACAINE 1/10 % EPIDURAL INFUSION (WH - ANES)
14.0000 mL/h | INTRAMUSCULAR | Status: DC | PRN
  Administered 2018-04-22: 14 mL/h via EPIDURAL
  Filled 2018-04-22: qty 100

## 2018-04-22 MED ORDER — LACTATED RINGERS IV SOLN
INTRAVENOUS | Status: DC
  Administered 2018-04-22 (×2): via INTRAVENOUS

## 2018-04-22 MED ORDER — OXYTOCIN 40 UNITS IN LACTATED RINGERS INFUSION - SIMPLE MED
1.0000 m[IU]/min | INTRAVENOUS | Status: DC
  Administered 2018-04-22: 2 m[IU]/min via INTRAVENOUS
  Filled 2018-04-22: qty 1000

## 2018-04-22 MED ORDER — OXYCODONE-ACETAMINOPHEN 5-325 MG PO TABS: 2.0000 | ORAL_TABLET | ORAL | Status: DC | PRN

## 2018-04-22 MED ORDER — SODIUM CHLORIDE 0.9% FLUSH: 3.0000 mL | INTRAVENOUS | Status: DC | PRN

## 2018-04-22 MED ORDER — OXYTOCIN 40 UNITS IN LACTATED RINGERS INFUSION - SIMPLE MED: 2.5000 [IU]/h | INTRAVENOUS | Status: DC

## 2018-04-22 MED ORDER — EPHEDRINE 5 MG/ML INJ
10.0000 mg | INTRAVENOUS | Status: DC | PRN
  Filled 2018-04-22: qty 2

## 2018-04-22 MED ORDER — LIDOCAINE HCL (PF) 1 % IJ SOLN
30.0000 mL | INTRAMUSCULAR | Status: DC | PRN
  Filled 2018-04-22: qty 30

## 2018-04-22 MED ORDER — ACETAMINOPHEN 325 MG PO TABS
650.0000 mg | ORAL_TABLET | ORAL | Status: DC | PRN
  Administered 2018-04-23: 650 mg via ORAL
  Filled 2018-04-22: qty 2

## 2018-04-22 MED ORDER — OXYCODONE-ACETAMINOPHEN 5-325 MG PO TABS: 1.0000 | ORAL_TABLET | ORAL | Status: DC | PRN

## 2018-04-22 MED ORDER — DIPHENHYDRAMINE HCL 50 MG/ML IJ SOLN: 12.5000 mg | INTRAMUSCULAR | Status: DC | PRN

## 2018-04-22 MED ORDER — ONDANSETRON HCL 4 MG/2ML IJ SOLN
4.0000 mg | Freq: Four times a day (QID) | INTRAMUSCULAR | Status: DC | PRN
  Administered 2018-04-23: 4 mg via INTRAVENOUS
  Filled 2018-04-22: qty 2

## 2018-04-22 MED ORDER — PHENYLEPHRINE 40 MCG/ML (10ML) SYRINGE FOR IV PUSH (FOR BLOOD PRESSURE SUPPORT)
80.0000 ug | PREFILLED_SYRINGE | INTRAVENOUS | Status: DC | PRN
  Filled 2018-04-22: qty 10
  Filled 2018-04-22: qty 5

## 2018-04-22 MED ORDER — OXYTOCIN BOLUS FROM INFUSION
500.0000 mL | Freq: Once | INTRAVENOUS | Status: AC
  Administered 2018-04-23: 500 mL via INTRAVENOUS

## 2018-04-22 MED ORDER — SODIUM CHLORIDE 0.9% FLUSH: 3.0000 mL | Freq: Two times a day (BID) | INTRAVENOUS | Status: DC

## 2018-04-22 MED ORDER — LACTATED RINGERS IV SOLN
500.0000 mL | Freq: Once | INTRAVENOUS | Status: AC
  Administered 2018-04-22: 250 mL via INTRAVENOUS

## 2018-04-22 MED ORDER — LIDOCAINE HCL (PF) 1 % IJ SOLN
INTRAMUSCULAR | Status: DC | PRN
  Administered 2018-04-22 (×2): 5 mL via EPIDURAL

## 2018-04-22 MED ORDER — SOD CITRATE-CITRIC ACID 500-334 MG/5ML PO SOLN
30.0000 mL | ORAL | Status: DC | PRN
  Administered 2018-04-22: 30 mL via ORAL
  Filled 2018-04-22: qty 15

## 2018-04-22 NOTE — Anesthesia Preprocedure Evaluation (Signed)
Anesthesia Evaluation  Patient identified by MRN, date of birth, ID band Patient awake    Reviewed: Allergy & Precautions, H&P , NPO status , Patient's Chart, lab work & pertinent test results  History of Anesthesia Complications Negative for: history of anesthetic complications  Airway Mallampati: II  TM Distance: >3 FB Neck ROM: full    Dental no notable dental hx. (+) Teeth Intact   Pulmonary neg pulmonary ROS,    Pulmonary exam normal breath sounds clear to auscultation       Cardiovascular hypertension, Normal cardiovascular exam Rhythm:regular Rate:Normal     Neuro/Psych negative neurological ROS  negative psych ROS   GI/Hepatic negative GI ROS, Neg liver ROS,   Endo/Other  negative endocrine ROS  Renal/GU negative Renal ROS  negative genitourinary   Musculoskeletal   Abdominal   Peds  Hematology negative hematology ROS (+)   Anesthesia Other Findings   Reproductive/Obstetrics (+) Pregnancy                             Anesthesia Physical Anesthesia Plan  ASA: II  Anesthesia Plan: Epidural   Post-op Pain Management:    Induction:   PONV Risk Score and Plan:   Airway Management Planned:   Additional Equipment:   Intra-op Plan:   Post-operative Plan:   Informed Consent: I have reviewed the patients History and Physical, chart, labs and discussed the procedure including the risks, benefits and alternatives for the proposed anesthesia with the patient or authorized representative who has indicated his/her understanding and acceptance.     Plan Discussed with:   Anesthesia Plan Comments:         Anesthesia Quick Evaluation  

## 2018-04-22 NOTE — Anesthesia Procedure Notes (Signed)
Epidural Patient location during procedure: OB  Staffing Anesthesiologist: Miquela Costabile, MD Performed: anesthesiologist   Preanesthetic Checklist Completed: patient identified, site marked, surgical consent, pre-op evaluation, timeout performed, IV checked, risks and benefits discussed and monitors and equipment checked  Epidural Patient position: sitting Prep: DuraPrep Patient monitoring: heart rate, continuous pulse ox and blood pressure Approach: right paramedian Location: L3-L4 Injection technique: LOR saline  Needle:  Needle type: Tuohy  Needle gauge: 17 G Needle length: 9 cm and 9 Needle insertion depth: 7 cm Catheter type: closed end flexible Catheter size: 20 Guage Catheter at skin depth: 11 cm Test dose: negative  Assessment Events: blood not aspirated, injection not painful, no injection resistance, negative IV test and no paresthesia  Additional Notes Patient identified. Risks/Benefits/Options discussed with patient including but not limited to bleeding, infection, nerve damage, paralysis, failed block, incomplete pain control, headache, blood pressure changes, nausea, vomiting, reactions to medication both or allergic, itching and postpartum back pain. Confirmed with bedside nurse the patient's most recent platelet count. Confirmed with patient that they are not currently taking any anticoagulation, have any bleeding history or any family history of bleeding disorders. Patient expressed understanding and wished to proceed. All questions were answered. Sterile technique was used throughout the entire procedure. Please see nursing notes for vital signs. Test dose was given through epidural needle and negative prior to continuing to dose epidural or start infusion. Warning signs of high block given to the patient including shortness of breath, tingling/numbness in hands, complete motor block, or any concerning symptoms with instructions to call for help. Patient was given  instructions on fall risk and not to get out of bed. All questions and concerns addressed with instructions to call with any issues.     

## 2018-04-22 NOTE — Progress Notes (Addendum)
Anna Burns is a 27 y.o. [id number] at [redacted]w[redacted]d admitted for induction of labor due to Hypertension.  Subjective: Patient is comfortable with epidural. Reports feeling pressure in her vagina "like his head is coming out". On exam fetal head is noted to be low, 0 station.   Objective: Vitals:   04/22/18 2145 04/22/18 2147 04/22/18 2200 04/22/18 2230  BP: 137/86  140/89 139/79  Pulse: 91  90 89  Resp:      Temp:  98.1 F (36.7 C)    TempSrc:  Oral    SpO2:      Weight:      Height:        FHT:  FHR: 130 bpm, variability: moderate,  accelerations:  Present,  decelerations:  Present occasional early and mild variable decels UC:   regular, every 2-3 minutes SVE:   Dilation: 6 Effacement (%): 90 Station: 0 Exam by:: Ranee Gosselin, CNM  Labs: Lab Results  Component Value Date   WBC 9.4 04/22/2018   HGB 9.6 (L) 04/22/2018   HCT 30.1 (L) 04/22/2018   MCV 74.3 (L) 04/22/2018   PLT 257 04/22/2018    Assessment / Plan: Induction of labor due to gestational hypertension,  progressing well on pitocin  Labor: Progressing normally Preeclampsia:  no signs or symptoms of toxicity, intake and ouput balanced and labs stable Fetal Wellbeing:  Category II but overall reassuring  Pain Control:  Epidural I/D:  n/a Anticipated MOD:  NSVD  Marikay Alar 04/22/2018, 11:04 PM

## 2018-04-22 NOTE — H&P (Addendum)
Anna Burns is a 27 y.o. female presenting for induction of labor for gestational hypertension. She denies symptoms of preeclampsia: headache, vision changes, epigastric pain. 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 She reports her last outbreak was several weeks ago. She was started on Valtex at 36 weeks but did not take it. She denies prodromal symptoms. Dr. Alwyn Pea examined patient vaginally twice this week and did not note any HSV lesions. She is aware of patient's recent outbreak history and that patient didn't take Valtex and is agreeable to a vaginal birth because she is asymptomatic.   OB History    Gravida  4   Para  1   Term  1   Preterm      AB  2   Living  1     SAB  1   TAB  1   Ectopic      Multiple      Live Births  1          Past Medical History:  Diagnosis Date  . Ovarian tumor (benign), left    Past Surgical History:  Procedure Laterality Date  . ELBOW SURGERY    . LEFT OOPHORECTOMY  2013   Family History: family history includes Aneurysm in her mother; Diabetes in her father and paternal grandmother. Social History:  reports that she has never smoked. She has never used smokeless tobacco. She reports that she does not drink alcohol or use drugs.     Maternal Diabetes: No Genetic Screening: Normal Maternal Ultrasounds/Referrals: Normal Fetal Ultrasounds or other Referrals:  None Maternal Substance Abuse:  No Significant Maternal Medications:  None Significant Maternal Lab Results:  None Other Comments:  None  Review of Systems  Eyes: Negative for blurred vision.  Gastrointestinal: Negative for abdominal pain.  Neurological: Negative for headaches.  All other systems reviewed and are negative.  Maternal Medical History:  Fetal activity: Perceived fetal activity is normal.   Last perceived fetal movement was within the past hour.    Prenatal Complications - Diabetes: none.      Dilation: 3.5 Effacement (%):  70 Station: -1 Exam by:: [employer], CNM   Vitals:   04/22/18 1637  BP: (!) 141/90  Pulse: 97  Resp: 18  Temp: 98.4 F (36.9 C)  TempSrc: Oral  Weight: 94.4 kg  Height: 5\' 3"  (1.6 m)    Maternal Exam:  Abdomen: Patient reports no abdominal tenderness. Fundal height is Size=dates.   Estimated fetal weight is 7lbs.   Fetal presentation: vertex  Introitus: Normal vulva. Vulva is negative for lesion.  Normal vagina.  Pelvis: adequate for delivery.   Cervix: Cervix evaluated by digital exam.     Fetal Exam Fetal Monitor Review: Mode: ultrasound.   Baseline rate: 135.  Variability: moderate (6-25 bpm).   Pattern: accelerations present and no decelerations.    Fetal State Assessment: Category I - tracings are normal.    No signs of genital HSV lesions on external or internal speculum exam.   Physical Exam  Vitals reviewed. Constitutional: She is oriented to person, place, and time. She appears well-developed and well-nourished.  HENT:  Head: Normocephalic and atraumatic.  Eyes: Pupils are equal, round, and reactive to light.  Cardiovascular: Normal rate, regular rhythm and normal heart sounds.  Respiratory: Effort normal and breath sounds normal.  GI: There is no tenderness.  Genitourinary: Vagina normal and uterus normal. Vulva exhibits no lesion.  Musculoskeletal: Normal range of motion.  Neurological: She is alert  and oriented to person, place, and time.  Skin: Skin is warm and dry.  Psychiatric: She has a normal mood and affect. Her behavior is normal. Judgment and thought content normal.    Prenatal labs: ABO, Rh:  B+ Antibody:   Negative Rubella:  Immune RPR:   NR HBsAg:   NR HIV:   NR GBS:   Negative   Assessment/Plan: 27 y.o. G4P1 at [redacted]w[redacted]d Gestational hypertension Cat 1 FHTs Genital HSV without lesions on internal/external exam or prodromal symptoms  Admit to L&D Repeat PIH labs Pitocin induction Dr. Alwyn Pea specialing    Marikay Alar 04/22/2018, 5:00  PM

## 2018-04-22 NOTE — Progress Notes (Signed)
Anna Burns is a 27 y.o. [id number] at [redacted]w[redacted]d admitted for induction of labor due to Hypertension.  Subjective: Patient is feeling mild tightening with contractions. Discussed AROM with patient including risks and benefits and she consented. AROM clear moderate amount.   Objective: Vitals:   04/22/18 1800 04/22/18 1830 04/22/18 1900 04/22/18 1921  BP: 134/86 138/85 129/83   Pulse: 91 97 93   Resp:    18  Temp:    98.2 F (36.8 C)  TempSrc:    Oral  Weight:      Height:        FHT:  FHR: 130 bpm, variability: moderate,  accelerations:  Present,  decelerations:  Absent UC:   irregular, every 2-4 minutes SVE:   Dilation: 3.5 Effacement (%): 70 Station: -1 Exam by:: Ranee Gosselin, CNM  Labs: Lab Results  Component Value Date   WBC 9.4 04/22/2018   HGB 9.6 (L) 04/22/2018   HCT 30.1 (L) 04/22/2018   MCV 74.3 (L) 04/22/2018   PLT 257 04/22/2018    Assessment / Plan: Induction of labor due to gestational hypertension,  progressing well on pitocin  Labor: Progressing normally Preeclampsia:  no signs or symptoms of toxicity, intake and ouput balanced and labs stable Fetal Wellbeing:  Category I Pain Control:  Labor support without medications I/D:  n/a Anticipated MOD:  NSVD  Marikay Alar 04/22/2018, 7:30 PM

## 2018-04-22 NOTE — Anesthesia Pain Management Evaluation Note (Signed)
  CRNA Pain Management Visit Note  Patient: Anna Burns, 27 y.o., female  "Hello I am a member of the anesthesia team at [facility]. We have an anesthesia team available at all times to provide care throughout the hospital, including epidural management and anesthesia for C-section. I don't know your plan for the delivery whether it a natural birth, water birth, IV sedation, nitrous supplementation, doula or epidural, but we want to meet your pain goals."   1.Was your pain managed to your expectations on prior hospitalizations?   Yes   2.What is your expectation for pain management during this hospitalization?     Epidural  3.How can we help you reach that goal? Maintain epidural until delivery of infant.  Record the patient's initial score and the patient's pain goal.   Pain: 1  Pain Goal: 1 [facility] wants you to be able to say your pain was always managed very well.  Anna Burns 04/22/2018

## 2018-04-23 ENCOUNTER — Encounter (HOSPITAL_COMMUNITY): Payer: Self-pay | Admitting: Emergency Medicine

## 2018-04-23 LAB — CBC
HEMATOCRIT: 27.2 % — AB (ref 36.0–46.0)
Hemoglobin: 8.9 g/dL — ABNORMAL LOW (ref 12.0–15.0)
MCH: 24.2 pg — AB (ref 26.0–34.0)
MCHC: 32.7 g/dL (ref 30.0–36.0)
MCV: 73.9 fL — AB (ref 80.0–100.0)
Platelets: 235 10*3/uL (ref 150–400)
RBC: 3.68 MIL/uL — AB (ref 3.87–5.11)
RDW: 14.3 % (ref 11.5–15.5)
WBC: 13.7 10*3/uL — ABNORMAL HIGH (ref 4.0–10.5)
nRBC: 0 % (ref 0.0–0.2)

## 2018-04-23 LAB — RPR: RPR: NONREACTIVE

## 2018-04-23 LAB — POSTPARTUM HEMORRHAGE PROTOCOL (BB NOTIFICATION)

## 2018-04-23 MED ORDER — DIPHENHYDRAMINE HCL 25 MG PO CAPS: 25.0000 mg | ORAL_CAPSULE | Freq: Four times a day (QID) | ORAL | Status: DC | PRN

## 2018-04-23 MED ORDER — OXYCODONE-ACETAMINOPHEN 5-325 MG PO TABS: 2.0000 | ORAL_TABLET | ORAL | Status: DC | PRN

## 2018-04-23 MED ORDER — MISOPROSTOL 50MCG HALF TABLET
ORAL_TABLET | ORAL | Status: AC
  Filled 2018-04-23: qty 4

## 2018-04-23 MED ORDER — WITCH HAZEL-GLYCERIN EX PADS: 1.0000 "application " | MEDICATED_PAD | CUTANEOUS | Status: DC | PRN

## 2018-04-23 MED ORDER — TETANUS-DIPHTH-ACELL PERTUSSIS 5-2.5-18.5 LF-MCG/0.5 IM SUSP: 0.5000 mL | Freq: Once | INTRAMUSCULAR | Status: DC

## 2018-04-23 MED ORDER — DIPHENOXYLATE-ATROPINE 2.5-0.025 MG PO TABS
1.0000 | ORAL_TABLET | Freq: Once | ORAL | Status: AC
  Administered 2018-04-23: 1 via ORAL

## 2018-04-23 MED ORDER — COCONUT OIL OIL: 1.0000 "application " | TOPICAL_OIL | Status: DC | PRN

## 2018-04-23 MED ORDER — IBUPROFEN 600 MG PO TABS
600.0000 mg | ORAL_TABLET | Freq: Four times a day (QID) | ORAL | Status: DC
  Administered 2018-04-23 – 2018-04-25 (×10): 600 mg via ORAL
  Filled 2018-04-23 (×10): qty 1

## 2018-04-23 MED ORDER — DIBUCAINE 1 % RE OINT: 1.0000 "application " | TOPICAL_OINTMENT | RECTAL | Status: DC | PRN

## 2018-04-23 MED ORDER — MISOPROSTOL 200 MCG PO TABS
ORAL_TABLET | ORAL | Status: AC
  Filled 2018-04-23: qty 4

## 2018-04-23 MED ORDER — ERYTHROMYCIN 5 MG/GM OP OINT
TOPICAL_OINTMENT | OPHTHALMIC | Status: AC
  Filled 2018-04-23: qty 1

## 2018-04-23 MED ORDER — ONDANSETRON HCL 4 MG PO TABS: 4.0000 mg | ORAL_TABLET | ORAL | Status: DC | PRN

## 2018-04-23 MED ORDER — SIMETHICONE 80 MG PO CHEW: 80.0000 mg | CHEWABLE_TABLET | ORAL | Status: DC | PRN

## 2018-04-23 MED ORDER — FERROUS SULFATE 325 (65 FE) MG PO TABS
325.0000 mg | ORAL_TABLET | Freq: Two times a day (BID) | ORAL | Status: DC
  Administered 2018-04-23 – 2018-04-25 (×4): 325 mg via ORAL
  Filled 2018-04-23 (×5): qty 1

## 2018-04-23 MED ORDER — CARBOPROST TROMETHAMINE 250 MCG/ML IM SOLN
250.0000 ug | Freq: Once | INTRAMUSCULAR | Status: AC
  Administered 2018-04-23: 250 ug via INTRAMUSCULAR

## 2018-04-23 MED ORDER — ONDANSETRON HCL 4 MG/2ML IJ SOLN: 4.0000 mg | INTRAMUSCULAR | Status: DC | PRN

## 2018-04-23 MED ORDER — BENZOCAINE-MENTHOL 20-0.5 % EX AERO: 1.0000 "application " | INHALATION_SPRAY | CUTANEOUS | Status: DC | PRN

## 2018-04-23 MED ORDER — LACTATED RINGERS IV SOLN: INTRAVENOUS | Status: DC

## 2018-04-23 MED ORDER — MISOPROSTOL 200 MCG PO TABS
1000.0000 ug | ORAL_TABLET | Freq: Once | ORAL | Status: AC
  Administered 2018-04-23: 1000 ug via RECTAL

## 2018-04-23 MED ORDER — PRENATAL MULTIVITAMIN CH
1.0000 | ORAL_TABLET | Freq: Every day | ORAL | Status: DC
  Administered 2018-04-23 – 2018-04-25 (×3): 1 via ORAL
  Filled 2018-04-23 (×3): qty 1

## 2018-04-23 MED ORDER — ACETAMINOPHEN 325 MG PO TABS
650.0000 mg | ORAL_TABLET | ORAL | Status: DC | PRN
  Administered 2018-04-24: 650 mg via ORAL
  Filled 2018-04-23: qty 2

## 2018-04-23 MED ORDER — OXYCODONE-ACETAMINOPHEN 5-325 MG PO TABS: 1.0000 | ORAL_TABLET | ORAL | Status: DC | PRN

## 2018-04-23 MED ORDER — SENNOSIDES-DOCUSATE SODIUM 8.6-50 MG PO TABS
2.0000 | ORAL_TABLET | ORAL | Status: DC
  Administered 2018-04-23 – 2018-04-24 (×2): 2 via ORAL
  Filled 2018-04-23 (×2): qty 2

## 2018-04-23 MED ORDER — ZOLPIDEM TARTRATE 5 MG PO TABS: 5.0000 mg | ORAL_TABLET | Freq: Every evening | ORAL | Status: DC | PRN

## 2018-04-23 MED ORDER — DIPHENOXYLATE-ATROPINE 2.5-0.025 MG PO TABS
ORAL_TABLET | ORAL | Status: AC
  Filled 2018-04-23: qty 1

## 2018-04-23 NOTE — Anesthesia Postprocedure Evaluation (Signed)
Anesthesia Post Note  Patient: Anna Burns  Procedure(s) Performed: AN AD HOC LABOR EPIDURAL     Patient location during evaluation: Mother Baby Anesthesia Type: Epidural Level of consciousness: awake and alert Pain management: pain level controlled Vital Signs Assessment: post-procedure vital signs reviewed and stable Respiratory status: spontaneous breathing, nonlabored ventilation and respiratory function stable Cardiovascular status: stable Postop Assessment: no headache, no backache, epidural receding, patient able to bend at knees, able to ambulate, adequate PO intake and no apparent nausea or vomiting Anesthetic complications: no    Last Vitals:  Vitals:   04/23/18 0429 04/23/18 0545  BP: 123/79 129/84  Pulse: 78 74  Resp: 18   Temp: 36.7 C   SpO2: 100% 99%    Last Pain:  Vitals:   04/23/18 0545  TempSrc:   PainSc: 4    Pain Goal:                 [employer]

## 2018-04-23 NOTE — Lactation Note (Addendum)
This note was copied from a baby's chart. Lactation Consultation Note  Patient Name: Anna Burns [id number] Date: 04/23/2018   P2, 69 hour female infant. Mom though she did not have any  breast milk to give to her infant. LC help mom  hand expression and mom expressed 2 ml of colostrum  that was spoon feed to infant. Per mom, she formula feed infant less than 2 hours prior LC entering room. LC notice mom has flat nipples. Infant very sleepy at this time. She has been formula feeding past 6 hours  but wants to  continue breastfeeding her son. Mom  hand expressed additional 3 ml that she will give to infant at next feeding. Mom will call [place] for next feeding to help assist in latching infant to breast.   Maternal Data    Feeding Feeding Type: Breast Fed  LATCH Score                   Interventions    Lactation Tools Discussed/Used     Consult Status      [name] 04/23/2018, 10:23 PM

## 2018-04-23 NOTE — Lactation Note (Signed)
This note was copied from a baby's chart. Lactation Consultation Note  Patient Name: Anna Burns [id number] Date: 04/23/2018   P1, Baby 47 hours old.  Mother states baby latched in the back but plans to breastfeed and formula feed. She also would like to pump and bottle feed.  Discussed how breastmilk comes to volume. Mother has flat nipples.  Had mother prepump before latching. Assisted w/ latching baby in football hold.  Baby has tight suck and is coming off and on breast.  Mother states she is unsure if she wants to breastfeed.  Encouraged mother. Set up DEBP.  Reviewed cleaning and spoon feeding.          Maternal Data    Feeding    LATCH Score                   Interventions    Lactation Tools Discussed/Used     Consult Status      Vivianne Master [facility] 04/23/2018, 11:09 AM

## 2018-04-23 NOTE — Progress Notes (Signed)
Subjective: Called by RN to assess patient's bleeding. Patient continuing to have a trickle of blood when she bears down or fundus is massaged.   Objective: A moderate amount of blood was noted on the pad. With maternal movement a small amount of blood was noted. Fundus palpated firm to massage. With massage a small amount of blood was noted. No bleeding was noted without maternal effort or massage.   Assessment/plan: Discussed Tranexamic acid with patient as an option for cessation of bleeding. Discussed the risks and benefits. Patient declines at this time and wants to see if bleeding will resolve spontaneously. She is willing to reconsider the medication if there is a continued need once she transfers to the postpartum floor. Patient is stable and bleeding is not excessive, will reassess bleeding and need for TXA as needed.   Marikay Alar 04/23/18 3:02 AM

## 2018-04-24 NOTE — Progress Notes (Signed)
MOB was referred for history of depression/anxiety. * Referral screened out by Clinical Social Worker because none of the following criteria appear to apply: ~ History of anxiety/depression during this pregnancy, or of post-partum depression following prior delivery. ~ Diagnosis of anxiety and/or depression within last 3 years OR * MOB's symptoms currently being treated with medication and/or therapy. Please contact the Clinical Social Worker if needs arise, by [facility] request, or if MOB scores greater than 9/yes to question 10 on Edinburgh Postpartum Depression Screen.  *No concerns documented in H&P/ OB records.   Kingsley Spittle, [place]  [phone]

## 2018-04-24 NOTE — Progress Notes (Addendum)
Post Partum Day 1 Subjective: no complaints, up ad lib, voiding and tolerating PO  Objective: Blood pressure 108/63, pulse 78, temperature 98.1 F (36.7 C), temperature source Oral, resp. rate 18, height 5\' 3"  (1.6 m), weight 94.4 kg, last menstrual period 08/03/2017, SpO2 99 %, unknown if currently breastfeeding. Vitals:   04/23/18 2341 04/24/18 0607  BP: (!) 110/58 108/63  Pulse: 83 78  Resp: 16 18  Temp: 97.6 F (36.4 C) 98.1 F (36.7 C)  SpO2: 99%     Physical Exam:  General: alert, cooperative and no distress Lochia: appropriate Uterine Fundus: firm Incision: n/a DVT Evaluation: No evidence of DVT seen on physical exam. Negative Homan's sign.  Recent Labs    04/22/18 1645 04/23/18 0523  HGB 9.6* 8.9*  HCT 30.1* 27.2*    Assessment/Plan: Plan for discharge tomorrow, Breastfeeding, Lactation consult and Contraception Unsure - desires baby in 3 years   LOS: 2 days   Anna Burns 04/24/2018, 9:33 AM

## 2018-04-24 NOTE — Lactation Note (Signed)
This note was copied from a baby's chart. Lactation Consultation Note  Patient Name: Boy Tanica Gaige LVDIX'V Date: 04/24/2018 Reason for consult: Follow-up assessment P2, 27 hr female infant. Mom requested LC to help assist with latching infant to breast. Mom has flat nipples after few attempts of latching infant to breast .LC fitted mom with a 20 mm NS infant latched and actively suckle at breast for  15 minutes. Mom was still BF as [place] left room. Per mom, she tried BF her daughter with NS in past but her daughter wouldn't latch.  Mom is feeling good about BF infant at this time. Per mom had given infant EBM she had hand expressed earlier. Mom plans to pump after feeding.  Mom will call LC if she has any further questions or concerns or need any more assistance with latching infant to breast.   Maternal Data    Feeding    LATCH Score Latch: Grasps breast easily, tongue down, lips flanged, rhythmical sucking.  Audible Swallowing: Spontaneous and intermittent  Type of Nipple: Flat  Comfort (Breast/Nipple): Soft / non-tender  Hold (Positioning): Assistance needed to correctly position infant at breast and maintain latch.  LATCH Score: 8  Interventions    Lactation Tools Discussed/Used Tools: Nipple [employer];Shells Nipple shield size: 20   Consult Status Consult Status: Follow-up Date: 04/24/18 Follow-up type: In-patient    [name] 04/24/2018, 4:08 AM

## 2018-04-25 MED ORDER — IBUPROFEN 600 MG PO TABS: 600.0000 mg | ORAL_TABLET | Freq: Four times a day (QID) | ORAL | 0 refills | Status: DC | PRN

## 2018-04-25 MED ORDER — FERROUS SULFATE 325 (65 FE) MG PO TABS: 325.0000 mg | ORAL_TABLET | Freq: Every day | ORAL | 2 refills | Status: DC

## 2018-04-25 NOTE — Discharge Summary (Signed)
OB Discharge Summary     Patient Name: Anna Burns DOB: April 24, 1991 MRN: [id number]  Date of admission: 04/22/2018 Delivering MD: Sanjuana Kava   Date of discharge: 04/25/2018  Admitting diagnosis: labor Intrauterine pregnancy: [redacted]w[redacted]d     Secondary diagnosis:  Principal Problem:   Gestational hypertension Active Problems:   Gestational HTN  Additional problems: Postpartum hemorrhage      Discharge diagnosis: Term Pregnancy Delivered, CHTN, Anemia and PPH                                                                                                Post partum procedures:n/a  Augmentation: AROM and Pitocin  Complications: [id number]  Hospital course:  Induction of Labor With Vaginal Delivery   27 y.o. yo [id number] at [redacted]w[redacted]d was admitted to the hospital 04/22/2018 for induction of labor.  Indication for induction: Gestational hypertension.  Patient had an uncomplicated labor course as follows: Membrane Rupture Time/Date: 7:25 PM ,04/22/2018   Intrapartum Procedures: Episiotomy: None [1]                                         Lacerations:  None [1]  Patient had delivery of a Viable infant.  Information for the patient's newborn:  M[place]iah, Wahler [[id number]]  Delivery Method: Vag-Spont   04/23/2018  Details of delivery can be found in separate delivery note.  Patient had a routine postpartum course. Patient is discharged home 04/25/18.  Physical exam  Vitals:   04/24/18 0607 04/24/18 1351 04/24/18 2333 04/25/18 0527  BP: 108/63 111/71 129/78 124/87  Pulse: 78 91 94 97  Resp: 18 20 18 16   Temp: 98.1 F (36.7 C) 97.8 F (36.6 C) 98 F (36.7 C) 97.8 F (36.6 C)  TempSrc: Oral Oral Oral Axillary  SpO2:  100% 98% 99%  Weight:      Height:       General: alert, cooperative and no distress Lochia: appropriate Uterine Fundus: firm Incision: N/A DVT Evaluation: No evidence of DVT seen on physical exam. Negative Homan's sign. No cords or calf tenderness. No  significant calf/ankle edema. Preeclampsia: Patient denied any symptoms of preeclampsia: headache, vision changes, epigastric pain. She is normotensive. Preeclampsia labs were negative.   Labs: Lab Results  Component Value Date   WBC 13.7 (H) 04/23/2018   HGB 8.9 (L) 04/23/2018   HCT 27.2 (L) 04/23/2018   MCV 73.9 (L) 04/23/2018   PLT 235 04/23/2018   CMP Latest Ref Rng & Units 04/22/2018  Glucose 70 - 99 mg/dL 77  BUN 6 - 20 mg/dL <5(L)  Creatinine 0.44 - 1.00 mg/dL 0.40(L)  Sodium 135 - 145 mmol/L 137  Potassium 3.5 - 5.1 mmol/L 3.8  Chloride 98 - 111 mmol/L 107  CO2 22 - 32 mmol/L 20(L)  Calcium 8.9 - 10.3 mg/dL 9.0  Total Protein 6.5 - 8.1 g/dL 7.2  Total Bilirubin 0.3 - 1.2 mg/dL 0.2(L)  Alkaline Phos 38 - 126 U/L 93  AST 15 - 41 U/L 16  ALT 0 -  44 U/L 10    Discharge instruction: per After Visit Summary and "Baby and Me Booklet". Preeclampsia precautions and postpartum hemorrhage precautions were discussed with patient.   After visit meds:  Allergies as of 04/25/2018      Reactions   Contrast Media [iodinated Diagnostic Agents] Itching, Other (See Comments)   Itching on tongue      Medication List    STOP taking these medications   promethazine 12.5 MG tablet Commonly known as:  PHENERGAN     TAKE these medications   calcium carbonate 500 MG chewable tablet Commonly known as:  TUMS - dosed in mg elemental calcium Chew 1 tablet by mouth 5 (five) times daily as needed for indigestion or heartburn.   ferrous sulfate 325 (65 FE) MG tablet Take 1 tablet (325 mg total) by mouth daily with breakfast.   ibuprofen 600 MG tablet Commonly known as:  ADVIL,MOTRIN Take 1 tablet (600 mg total) by mouth every 6 (six) hours as needed for mild pain or moderate pain.        Diet: routine diet. Continue PO Iron QD for mild asymptomatic anemia.   Activity: Advance as tolerated. Pelvic rest for 6 weeks.   Outpatient follow up:6 weeks. Baby [facility] RN to follow  patient next week at home for blood pressure checks. Follow up Appt:No future appointments. Follow up Visit:No follow-ups on file.  Postpartum contraception: Progesterone only pills  Newborn Data: Live born female  Birth Weight: 6 lb 5.1 oz (2865 g) APGAR: 9, 9  Newborn Delivery   Birth date/time:  04/23/2018 00:59:00 Delivery type:  Vaginal, Spontaneous     Baby Feeding: Bottle and Breast Disposition:home with mother   04/25/2018 Marikay Alar, CNM

## 2018-04-25 NOTE — Lactation Note (Signed)
This note was copied from a baby's chart. Lactation Consultation Note  Patient Name: Anna Burns [id number] Date: 04/25/2018 Reason for consult: Follow-up assessment;1st time breastfeeding;Early term 37-38.6wks;Infant weight loss(per mom plans to  pump and bottle feed only / hand express.)  LC reviewed supply and demand the importance of consistent pumping around the clock 8- 10 x's a day for 15 -20 mins with a DEBP .  LC discussed [place] DEBP loaner and mom receptive , paperwork given to mom.  Mom will call when complete.    Maternal Data Has patient been taught Hand Expression?: Yes Does the patient have breastfeeding experience prior to this delivery?: No  Feeding Feeding Type: Bottle Fed - Formula Nipple Type: Slow - flow  LATCH Score                   Interventions Interventions: Breast feeding basics reviewed  Lactation Tools Discussed/Used WIC Program: ([place] excplained the [place] from [facility] and mom receptive ) Pump Review: Setup, frequency, and cleaning Initiated by:: reviewed/ MAI    Consult Status Consult Status: Follow-up Date: 04/25/18    Anna Burns 04/25/2018, 12:07 PM

## 2018-04-25 NOTE — Discharge Instructions (Signed)
Postpartum Care After Vaginal Delivery  The period of time right after you deliver your newborn is called the postpartum period. What kind of medical care will I receive?  You may continue to receive fluids and medicines through an IV tube inserted into one of your veins.  If an incision was made near your vagina (episiotomy) or if you had some vaginal tearing during delivery, cold compresses may be placed on your episiotomy or your tear. This helps to reduce pain and swelling.  You may be given a squirt bottle to use when you go to the bathroom. You may use this until you are comfortable wiping as usual. To use the squirt bottle, follow these steps: ? Before you urinate, fill the squirt bottle with warm water. Do not use hot water. ? After you urinate, while you are sitting on the toilet, use the squirt bottle to rinse the area around your urethra and vaginal opening. This rinses away any urine and blood. ? You may do this instead of wiping. As you start healing, you may use the squirt bottle before wiping yourself. Make sure to wipe gently. ? Fill the squirt bottle with clean water every time you use the bathroom.  You will be given sanitary pads to wear. How can I expect to feel?  You may not feel the need to urinate for several hours after delivery.  You will have some soreness and pain in your abdomen and vagina.  If you are breastfeeding, you may have uterine contractions every time you breastfeed for up to several weeks postpartum. Uterine contractions help your uterus return to its normal size.  It is normal to have vaginal bleeding (lochia) after delivery. The amount and appearance of lochia is often similar to a menstrual period in the first week after delivery. It will gradually decrease over the next few weeks to a dry, yellow-brown discharge. For most women, lochia stops completely by 6-8 weeks after delivery. Vaginal bleeding can vary from woman to woman.  Within the first few  days after delivery, you may have breast engorgement. This is when your breasts feel heavy, full, and uncomfortable. Your breasts may also throb and feel hard, tightly stretched, warm, and tender. After this occurs, you may have milk leaking from your breasts. Your health care provider can help you relieve discomfort due to breast engorgement. Breast engorgement should go away within a few days.  You may feel more sad or worried than normal due to hormonal changes after delivery. These feelings should not last more than a few days. If these feelings do not go away after several days, speak with your health care provider. How should I care for myself?  Tell your health care provider if you have pain or discomfort.  Drink enough water to keep your urine clear or pale yellow.  Wash your hands thoroughly with soap and water for at least 20 seconds after changing your sanitary pads, after using the toilet, and before holding or feeding your baby.  If you are not breastfeeding, avoid touching your breasts a lot. Doing this can make your breasts produce more milk.  If you become weak or lightheaded, or you feel like you might faint, ask for help before: ? Getting out of bed. ? Showering.  Change your sanitary pads frequently. Watch for any changes in your flow, such as a sudden increase in volume, a change in color, the passing of large blood clots. If you pass a blood clot from your vagina,  save it to show to your health care provider. Do not flush blood clots down the toilet without having your health care provider look at them.  Make sure that all your vaccinations are up to date. This can help protect you and your baby from getting certain diseases. You may need to have immunizations done before you leave the hospital.  If desired, talk with your health care provider about methods of family planning or birth control (contraception). How can I start bonding with my baby? Spending as much time as  possible with your baby is very important. During this time, you and your baby can get to know each other and develop a bond. Having your baby stay with you in your room (rooming in) can give you time to get to know your baby. Rooming in can also help you become comfortable caring for your baby. Breastfeeding can also help you bond with your baby. How can I plan for returning home with my baby?  Make sure that you have a car seat installed in your vehicle. ? Your car seat should be checked by a certified car seat installer to make sure that it is installed safely. ? Make sure that your baby fits into the car seat safely.  Ask your health care provider any questions you have about caring for yourself or your baby. Make sure that you are able to contact your health care provider with any questions after leaving the hospital. This information is not intended to replace advice given to you by your health care provider. Make sure you discuss any questions you have with your health care provider. Document Released: 04/28/2007 Document Revised: 12/04/2015 Document Reviewed: 06/05/2015 Elsevier Interactive Patient Education  2018 [employer].   Postpartum Depression and Baby Blues The postpartum period begins right after the birth of a baby. During this time, there is often a great amount of joy and excitement. It is also a time of many changes in the life of the parents. Regardless of how many times a mother gives birth, each child brings new challenges and dynamics to the family. It is not unusual to have feelings of excitement along with confusing shifts in moods, emotions, and thoughts. All mothers are at risk of developing postpartum depression or the "baby blues." These mood changes can occur right after giving birth, or they may occur many months after giving birth. The baby blues or postpartum depression can be mild or severe. Additionally, postpartum depression can go away rather quickly, or it can  be a long-term condition. What are the causes? Raised hormone levels and the rapid drop in those levels are thought to be a main cause of postpartum depression and the baby blues. A number of hormones change during and after pregnancy. Estrogen and progesterone usually decrease right after the delivery of your baby. The levels of thyroid hormone and various cortisol steroids also rapidly drop. Other factors that play a role in these mood changes include major life events and genetics. What increases the risk? If you have any of the following risks for the baby blues or postpartum depression, know what symptoms to watch out for during the postpartum period. Risk factors that may increase the likelihood of getting the baby blues or postpartum depression include:  Having a personal or family history of depression.  Having depression while being pregnant.  Having premenstrual mood issues or mood issues related to oral contraceptives.  Having a lot of life stress.  Having marital conflict.  Lacking  a social support network.  Having a baby with special needs.  Having health problems, such as diabetes.  What are the signs or symptoms? Symptoms of baby blues include:  Brief changes in mood, such as going from extreme happiness to sadness.  Decreased concentration.  Difficulty sleeping.  Crying spells, tearfulness.  Irritability.  Anxiety.  Symptoms of postpartum depression typically begin within the first month after giving birth. These symptoms include:  Difficulty sleeping or excessive sleepiness.  Marked weight loss.  Agitation.  Feelings of worthlessness.  Lack of interest in activity or food.  Postpartum psychosis is a very serious condition and can be dangerous. Fortunately, it is rare. Displaying any of the following symptoms is cause for immediate medical attention. Symptoms of postpartum psychosis include:  Hallucinations and delusions.  Bizarre or disorganized  behavior.  Confusion or disorientation.  How is this diagnosed? A diagnosis is made by an evaluation of your symptoms. There are no medical or lab tests that lead to a diagnosis, but there are various questionnaires that a health care provider may use to identify those with the baby blues, postpartum depression, or psychosis. Often, a screening tool called the [place] Postnatal Depression Scale is used to diagnose depression in the postpartum period. How is this treated? The baby blues usually goes away on its own in 1-2 weeks. Social support is often all that is needed. You will be encouraged to get adequate sleep and rest. Occasionally, you may be given medicines to help you sleep. Postpartum depression requires treatment because it can last several months or longer if it is not treated. Treatment may include individual or group therapy, medicine, or both to address any social, physiological, and psychological factors that may play a role in the depression. Regular exercise, a healthy diet, rest, and social support may also be strongly recommended. Postpartum psychosis is more serious and needs treatment right away. Hospitalization is often needed. Follow these instructions at home:  Get as much rest as you can. Nap when the baby sleeps.  Exercise regularly. Some women find yoga and walking to be beneficial.  Eat a balanced and nourishing diet.  Do little things that you enjoy. Have a cup of tea, take a bubble bath, read your favorite magazine, or listen to your favorite music.  Avoid alcohol.  Ask for help with household chores, cooking, grocery shopping, or running errands as needed. Do not try to do everything.  Talk to people close to you about how you are feeling. Get support from your partner, family members, friends, or other new moms.  Try to stay positive in how you think. Think about the things you are grateful for.  Do not spend a lot of time alone.  Only take  over-the-counter or prescription medicine as directed by your health care provider.  Keep all your postpartum appointments.  Let your health care provider know if you have any concerns. Contact a health care provider if: You are having a reaction to or problems with your medicine. Get help right away if:  You have suicidal feelings.  You think you may harm the baby or someone else. This information is not intended to replace advice given to you by your health care provider. Make sure you discuss any questions you have with your health care provider. Document Released: 04/04/2004 Document Revis   Postpartum Hemorrhage Postpartum hemorrhage is excessive blood loss after childbirth. Vaginal bleeding after delivery is normal and should be expected. Bleeding (lochia) will occur for several days  after childbirth. This can be expected with normal vaginal deliveries and cesarean deliveries. However, postpartum hemorrhage is a potentially serious condition. What are the causes? This condition is caused by: A loss of muscle tone in the uterus after childbirth. This can be caused by: An abnormal placenta. Infection. Bladder swelling (distension). Failure to deliver all of the placenta or the retention of clots. Wounds in the birth canal caused by delivery of the fetus. Infection of the uterus. Infection of tissue around the fetus. A tear in the uterus. Tearing of the vagina or cervix during delivery. A maternal bleeding disorder that prevents blood from clotting (rare).  What increases the risk? This condition is likely to develop in people who: Have a history of postpartum hemorrhage. Had a delivery that lasted longer than usual. Have an excess of amniotic fluid in the amniotic sac (polyhydramnios), leading the uterus to stretch too much. Have delivered quintuplets or more babies. Had high blood pressure, seizures, or coma during pregnancy. Had a condition called preeclampsia or  eclampsia during pregnancy. Had problems with the placenta. Had complications during labor or delivery. Are obese. Are 76 years old or older. Are Asian or Hispanic.  What are the signs or symptoms? Symptoms of this condition include: Passing large clots or pieces of tissue. These may be small pieces of placenta left after delivery. Soaking more than one sanitary pad per hour for several hours. Heavy, bright-red bleeding that occurs 4 days or more after delivery. Discharge that has a bad smell. An unexplained fever. Nausea or vomiting. Pain or swelling near the vagina or perineum. A drop in blood pressure. Lightheadedness or fainting. Shortness of breath. A fast heart rate that happens with very little activity. Signs of shock, such as: Blurry vision. Chills. Dizziness. Weakness.  How is this diagnosed? This condition may be diagnosed based on: Your symptoms. A physical exam of your perineum, vagina, cervix, and uterus. Tests, including: Blood pressure and pulse measurements. Blood tests. Blood clotting tests. Ultrasonography.  How is this treated? Treatment for this condition depends on the severity of your symptoms. It may include: Uterine massage. Medicines to help the uterus contract. Blood transfusions. Fluids given through the vein. A medical procedure to compress arteries supplying the uterus.  Sometimes bleeding occurs if portions of the placenta are left behind in the uterus after delivery. If this happens, a curettage or scraping of the inside of the uterus must be done (rare). This usually stops the bleeding. If curettage does not stop the bleeding, surgery may be done to remove the uterus (hysterectomy), but this rarely occurs. If bleeding is due to clotting or bleeding problems that are not related to the pregnancy, other treatments may be needed. Follow these instructions at home: Limit your activity as directed by your health care provider.Your health  care provider may order bed rest (getting up to go to the bathroom only) or may allow you to continue light activity. Keep track of the number of pads you use each day and how soaked (saturated) they are. Write down this information. Do not use tampons. Do not douche or have sexual intercourse until your health care provider approves. Drink enough fluids to keep your urine clear or pale yellow. Get enough rest. Eat foods that are rich in iron, such as spinach, red meat, and legumes. Take any over-the-counter and prescription medicines only as told by your health care provider. Keep all follow-up visits as told by your health care provider. This is important. Get help right  away if: You experience severe cramps in your stomach, back, or abdomen. You have a fever. You pass large clots or tissue. Save any tissue for your health care provider to look at. Your bleeding increases. You have heavy bleeding that soaks one pad per hour for 2 hours in a row. You faint or become dizzy, weak, or lightheaded. Your sanitary pad count per hour is increasing. You are urinating less than usual or not at all. You have shortness of breath. Your heart rate is faster than usual. You have sudden chest pain. This information is not intended to replace advice given to you by your health care provider. Make sure you discuss any questions you have with your health care provider. Document Released: 09/21/2003 Document Revised: 02/28/2016 Document Reviewed: 02/02/2016 Elsevier Interactive Patient Education  2018 [place]. ed: 12/07/2015 Document Reviewed: 04/12/2013 Elsevier Interactive Patient Education  2017 [employer]   Preeclampsia and Eclampsia Preeclampsia is a serious condition that develops only during pregnancy. It is also called toxemia of pregnancy. This condition causes high blood pressure along with other symptoms, such as swelling and headaches. These symptoms may develop as the condition gets  worse. Preeclampsia may occur at 20 weeks of pregnancy or later. Diagnosing and treating preeclampsia early is very important. If not treated early, it can cause serious problems for you and your baby. One problem it can lead to is eclampsia, which is a condition that causes muscle jerking or shaking (convulsions or seizures) in the mother. Delivering your baby is the best treatment for preeclampsia or eclampsia. Preeclampsia and eclampsia symptoms usually go away after your baby is born. What are the causes? The cause of preeclampsia is not known. What increases the risk? The following risk factors make you more likely to develop preeclampsia:  Being pregnant for the first time.  Having had preeclampsia during a past pregnancy.  Having a family history of preeclampsia.  Having high blood pressure.  Being pregnant with twins or triplets.  Being 30 or older.  Being African-American.  Having kidney disease or diabetes.  Having medical conditions such as lupus or blood diseases.  Being very overweight (obese).  What are the signs or symptoms? The earliest signs of preeclampsia are:  High blood pressure.  Increased protein in your urine. Your health care provider will check for this at every visit before you give birth (prenatal visit).  Other symptoms that may develop as the condition gets worse include:  Severe headaches.  Sudden weight gain.  Swelling of the hands, face, legs, and feet.  Nausea and vomiting.  Vision problems, such as blurred or double vision.  Numbness in the face, arms, legs, and feet.  Urinating less than usual.  Dizziness.  Slurred speech.  Abdominal pain, especially upper abdominal pain.  Convulsions or seizures.  Symptoms generally go away after giving birth. How is this diagnosed? There are no screening tests for preeclampsia. Your health care provider will ask you about symptoms and check for signs of preeclampsia during your prenatal  visits. You may also have tests that include:  Urine tests.  Blood tests.  Checking your blood pressure.  Monitoring your babys heart rate.  Ultrasound.  How is this treated? You and your health care provider will determine the treatment approach that is best for you. Treatment may include:  Having more frequent prenatal exams to check for signs of preeclampsia, if you have an increased risk for preeclampsia.  Bed rest.  Reducing how much salt (sodium) you eat.  Medicine to lower your blood pressure.  Staying in the hospital, if your condition is severe. There, treatment will focus on controlling your blood pressure and the amount of fluids in your body (fluid retention).  You may need to take medicine (magnesium sulfate) to prevent seizures. This medicine may be given as an injection or through an IV tube.  Delivering your baby early, if your condition gets worse. You may have your labor started with medicine (induced), or you may have a cesarean delivery.  Follow these instructions at home: Eating and drinking   Drink enough fluid to keep your urine clear or pale yellow.  Eat a healthy diet that is low in sodium. Do not add salt to your food. Check nutrition labels to see how much sodium a food or beverage contains.  Avoid caffeine. Lifestyle  Do not use any products that contain nicotine or tobacco, such as cigarettes and e-cigarettes. If you need help quitting, ask your health care provider.  Do not use alcohol or drugs.  Avoid stress as much as possible. Rest and get plenty of sleep. General instructions  Take over-the-counter and prescription medicines only as told by your health care provider.  When lying down, lie on your side. This keeps pressure off of your baby.  When sitting or lying down, raise (elevate) your feet. Try putting some pillows underneath your lower legs.  Exercise regularly. Ask your health care provider what kinds of exercise are best  for you.  Keep all follow-up and prenatal visits as told by your health care provider. This is important. How is this prevented? To prevent preeclampsia or eclampsia from developing during another pregnancy:  Get proper medical care during pregnancy. Your health care provider may be able to prevent preeclampsia or diagnose and treat it early.  Your health care provider may have you take a low-dose aspirin or a calcium supplement during your next pregnancy.  You may have tests of your blood pressure and kidney function after giving birth.  Maintain a healthy weight. Ask your health care provider for help managing weight gain during pregnancy.  Work with your health care provider to manage any long-term (chronic) health conditions you have, such as diabetes or kidney problems.  Contact a health care provider if:  You gain more weight than expected.  You have headaches.  You have nausea or vomiting.  You have abdominal pain.  You feel dizzy or light-headed. Get help right away if:  You develop sudden or severe swelling anywhere in your body. This usually happens in the legs.  You gain 5 lbs (2.3 kg) or more during one week.  You have severe: ? Abdominal pain. ? Headaches. ? Dizziness. ? Vision problems. ? Confusion. ? Nausea or vomiting.  You have a seizure.  You have trouble moving any part of your body.  You develop numbness in any part of your body.  You have trouble speaking.  You have any abnormal bleeding.  You pass out. This information is not intended to replace advice given to you by your health care provider. Make sure you discuss any questions you have with your health care provider. Document Released: 06/28/2000 Document Revised: 02/27/2016 Document Reviewed: 02/05/2016 Elsevier Interactive Patient Education  [employer].

## 2018-04-26 LAB — TYPE AND SCREEN
ABO/RH(D): B POS
ANTIBODY SCREEN: NEGATIVE
UNIT DIVISION: 0
UNIT DIVISION: 0
Unit division: 0
Unit division: 0

## 2018-04-26 LAB — BPAM RBC
BLOOD PRODUCT EXPIRATION DATE: 201910232359
BLOOD PRODUCT EXPIRATION DATE: 201911032359
Blood Product Expiration Date: 201910232359
Blood Product Expiration Date: 201911032359
ISSUE DATE / TIME: 201910101600
ISSUE DATE / TIME: 201910101600
UNIT TYPE AND RH: 5100
UNIT TYPE AND RH: 5100
Unit Type and Rh: 5100
Unit Type and Rh: 5100

## 2018-07-15 NOTE — L&D Delivery Note (Signed)
Delivery Note Patient pushed for less than 10 minutes after she was noted to be C/C/+2. At 9:53 AM a viable and healthy female was delivered via Vaginal, Spontaneous (Presentation: LOA ).  APGAR 9/9  Weight pending.   Baby laid on maternal chest and delayed cord clamping done. Cord cut by father.  Cord blood obtained. Placenta spontaneously delivered, intact 3 vessels.  Uterine atony alleviated by massage and IV pitocin No lacerations noted Patient tolerated delivery well, No complications  Anesthesia: Epidural Episiotomy: None Lacerations: None Suture Repair: n/a Est. Blood Loss (mL):  200  Mom to postpartum.  Baby to Couplet care / Skin to Skin.  Jakobi Thetford, [place] 05/20/2019, 10:01 PM

## 2019-03-23 ENCOUNTER — Inpatient Hospital Stay (HOSPITAL_COMMUNITY)
Admission: AD | Admit: 2019-03-23 | Discharge: 2019-03-23 | Disposition: A | Discharge status: Home / Self Care | Payer: Medicaid Other | Attending: Obstetrics and Gynecology | Admitting: Obstetrics and Gynecology

## 2019-03-23 ENCOUNTER — Other Ambulatory Visit: Payer: Self-pay

## 2019-03-23 ENCOUNTER — Inpatient Hospital Stay (HOSPITAL_BASED_OUTPATIENT_CLINIC_OR_DEPARTMENT_OTHER): Payer: Medicaid Other

## 2019-03-23 ENCOUNTER — Inpatient Hospital Stay (HOSPITAL_COMMUNITY): Payer: Medicaid Other

## 2019-03-23 ENCOUNTER — Encounter (HOSPITAL_COMMUNITY): Payer: Self-pay | Admitting: *Deleted

## 2019-03-23 DIAGNOSIS — O9989 Other specified diseases and conditions complicating pregnancy, childbirth and the puerperium: Secondary | ICD-10-CM

## 2019-03-23 DIAGNOSIS — O99513 Diseases of the respiratory system complicating pregnancy, third trimester: Secondary | ICD-10-CM | POA: Insufficient documentation

## 2019-03-23 DIAGNOSIS — Z3A28 28 weeks gestation of pregnancy: Secondary | ICD-10-CM | POA: Diagnosis not present

## 2019-03-23 DIAGNOSIS — O4693 Antepartum hemorrhage, unspecified, third trimester: Secondary | ICD-10-CM | POA: Insufficient documentation

## 2019-03-23 DIAGNOSIS — Z823 Family history of stroke: Secondary | ICD-10-CM | POA: Diagnosis not present

## 2019-03-23 DIAGNOSIS — O26899 Other specified pregnancy related conditions, unspecified trimester: Secondary | ICD-10-CM

## 2019-03-23 DIAGNOSIS — O26893 Other specified pregnancy related conditions, third trimester: Secondary | ICD-10-CM | POA: Diagnosis not present

## 2019-03-23 DIAGNOSIS — Z833 Family history of diabetes mellitus: Secondary | ICD-10-CM | POA: Diagnosis not present

## 2019-03-23 DIAGNOSIS — M545 Low back pain: Secondary | ICD-10-CM | POA: Diagnosis not present

## 2019-03-23 DIAGNOSIS — Z91041 Radiographic dye allergy status: Secondary | ICD-10-CM | POA: Diagnosis not present

## 2019-03-23 DIAGNOSIS — J45909 Unspecified asthma, uncomplicated: Secondary | ICD-10-CM | POA: Insufficient documentation

## 2019-03-23 DIAGNOSIS — O133 Gestational [pregnancy-induced] hypertension without significant proteinuria, third trimester: Secondary | ICD-10-CM | POA: Diagnosis not present

## 2019-03-23 DIAGNOSIS — R109 Unspecified abdominal pain: Secondary | ICD-10-CM

## 2019-03-23 DIAGNOSIS — Z8249 Family history of ischemic heart disease and other diseases of the circulatory system: Secondary | ICD-10-CM | POA: Insufficient documentation

## 2019-03-23 HISTORY — DX: Gestational (pregnancy-induced) hypertension without significant proteinuria, unspecified trimester: O13.9

## 2019-03-23 HISTORY — DX: Anemia, unspecified: D64.9

## 2019-03-23 HISTORY — DX: Benign neoplasm of connective and other soft tissue, unspecified: D21.9

## 2019-03-23 HISTORY — DX: Unspecified asthma, uncomplicated: J45.909

## 2019-03-23 LAB — URINALYSIS, ROUTINE W REFLEX MICROSCOPIC
Bilirubin Urine: NEGATIVE
Glucose, UA: NEGATIVE mg/dL
Hgb urine dipstick: NEGATIVE
Ketones, ur: NEGATIVE mg/dL
Nitrite: NEGATIVE
Protein, ur: NEGATIVE mg/dL
Specific Gravity, Urine: 1.008 (ref 1.005–1.030)
pH: 7 (ref 5.0–8.0)

## 2019-03-23 LAB — WET PREP, GENITAL
Clue Cells Wet Prep HPF POC: NONE SEEN
Sperm: NONE SEEN
Trich, Wet Prep: NONE SEEN
Yeast Wet Prep HPF POC: NONE SEEN

## 2019-03-23 IMAGING — US US MFM OB LIMITED
1 series · 14 of 28 positions shown · non-contrast
Comparison: none

[Series 1: us mfm ob limited · 14 of 37 slices shown]
[im 2/37]
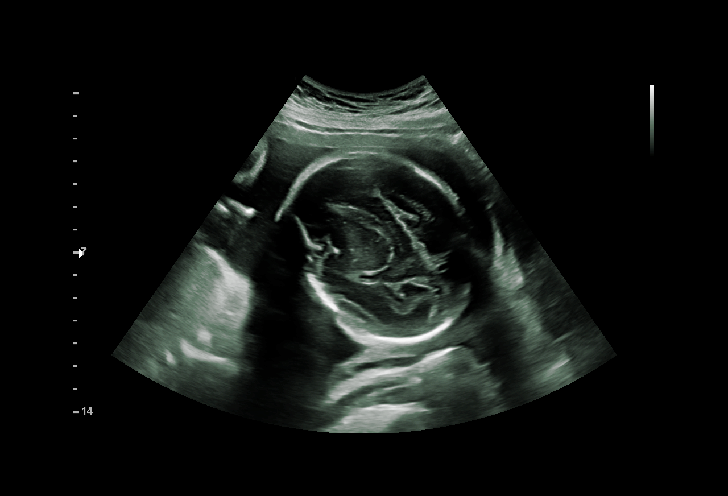
[im 5/37]
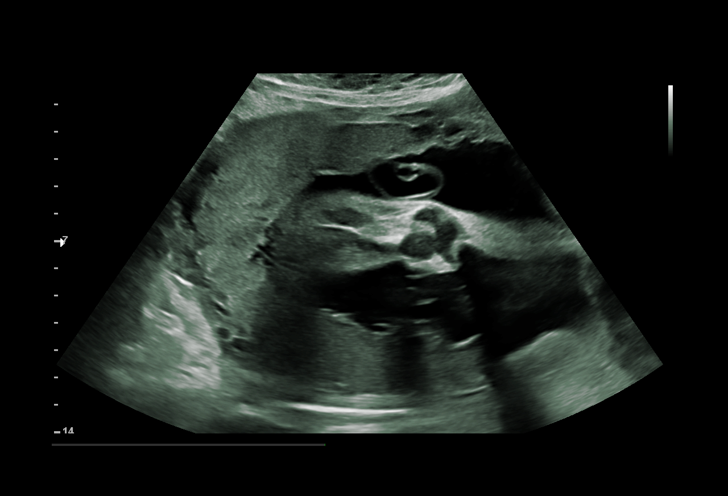
[im 7/37]
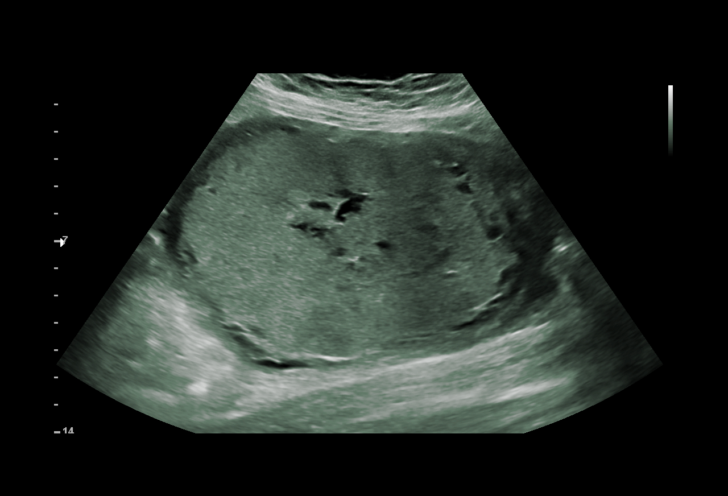
[im 10/37]
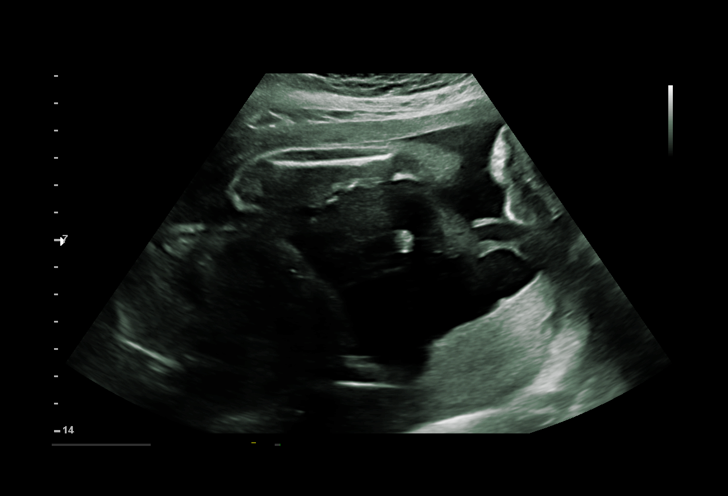
[im 13/37]
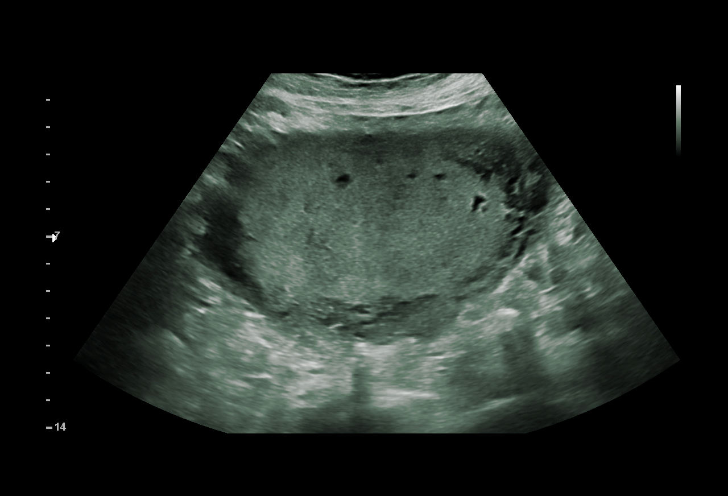
[im 15/37]
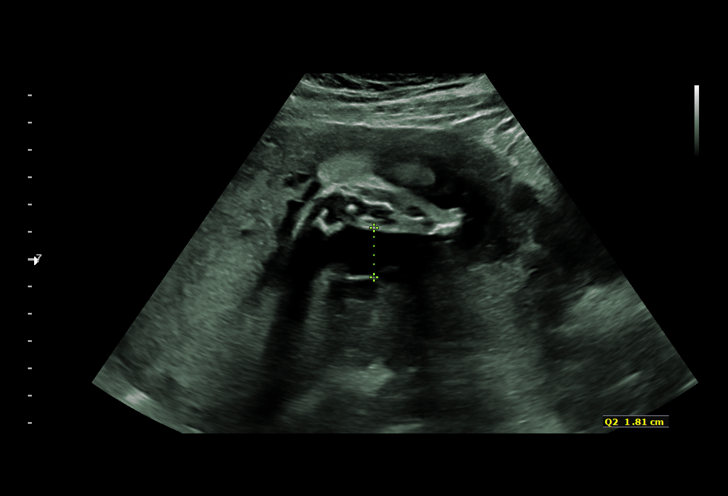
[im 18/37]
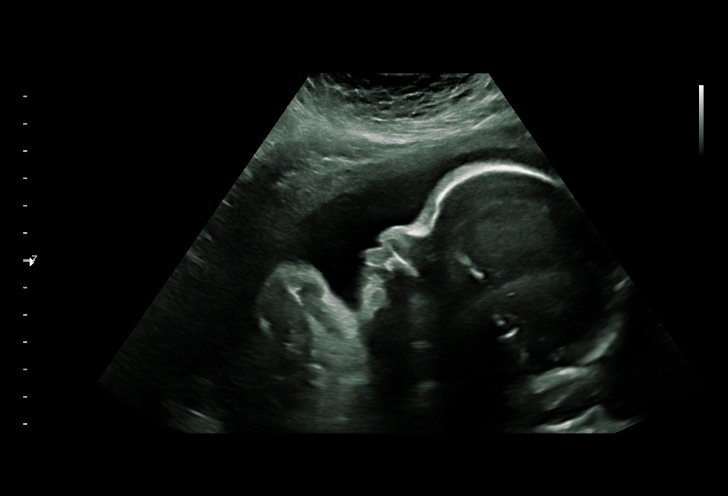
[im 21/37]
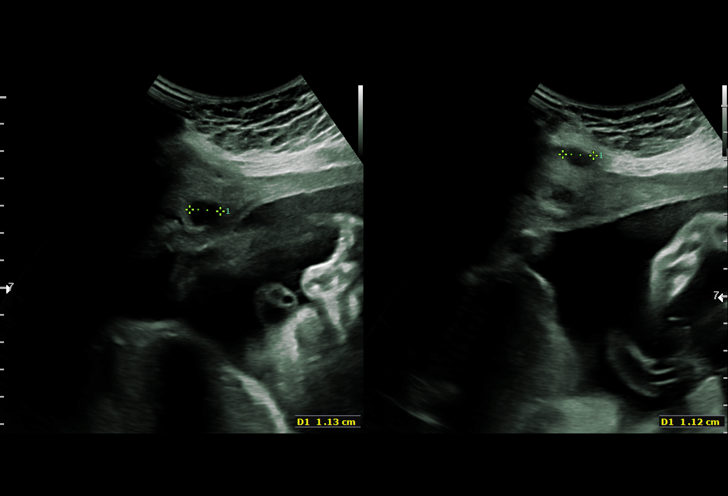
[im 23/37]
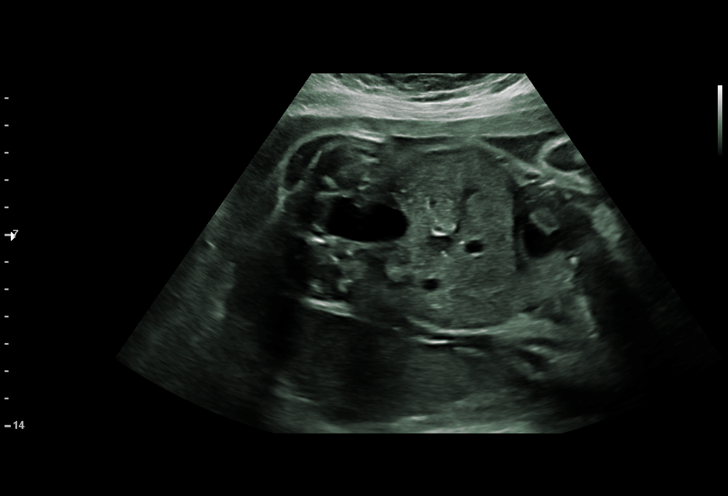
[im 26/37]
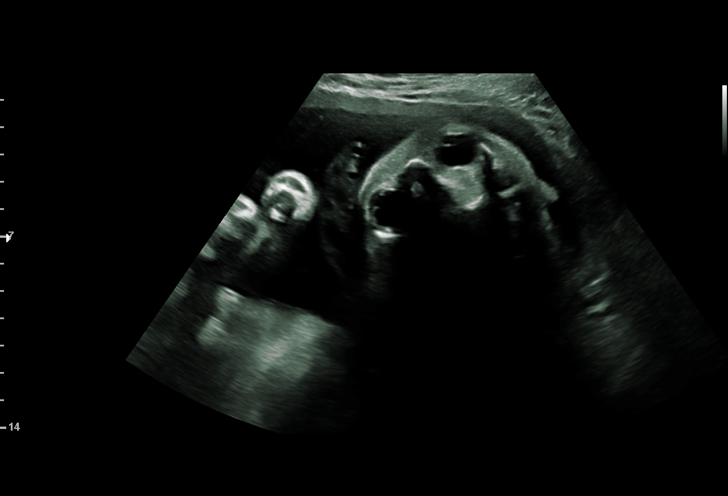
[im 29/37]
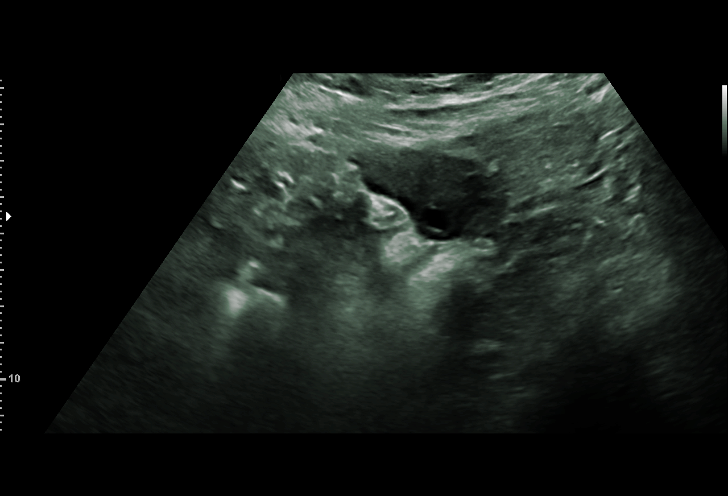
[im 31/37]
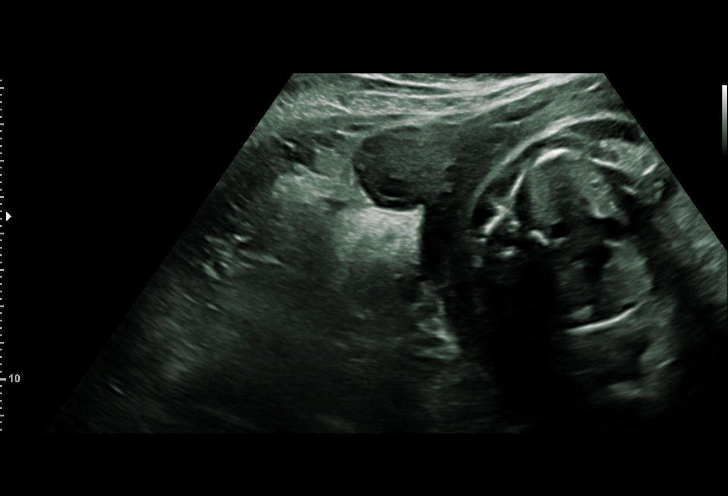
[im 34/37]
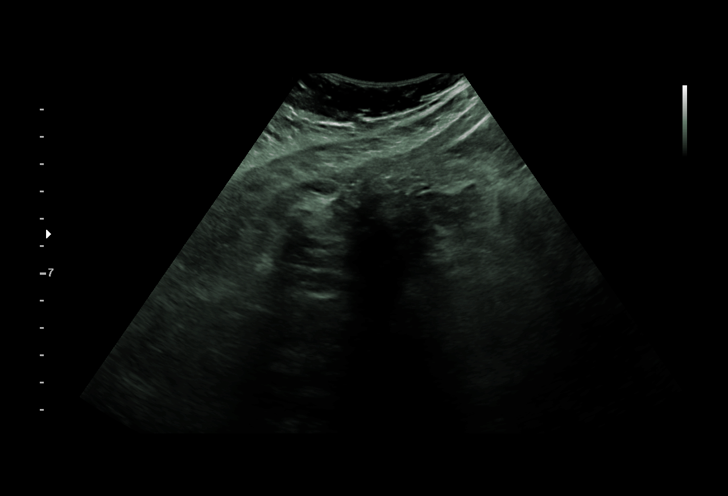
[im 37/37]
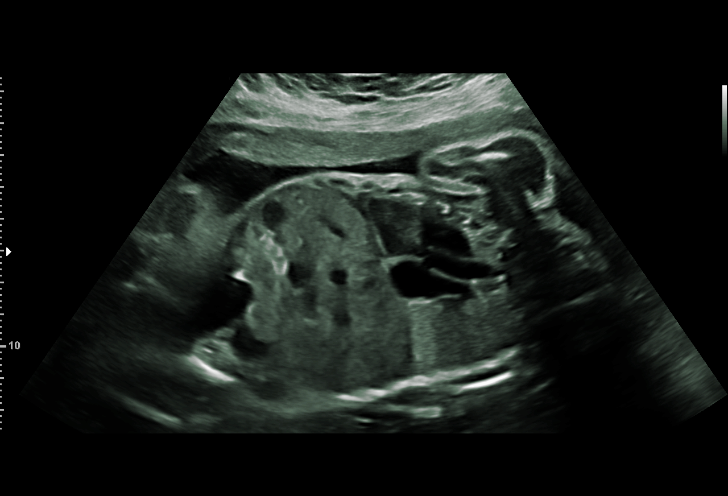

[14 of 28 positions shown; findings below may reference images not displayed]

1  US MFM OB LIMITED                    76815.01     AWA
 ----------------------------------------------------------------------

 ----------------------------------------------------------------------
Indications

  Abdominal pain in pregnancy                    [GZ]
  28 weeks gestation of pregnancy
 ----------------------------------------------------------------------
Fetal Evaluation

 Num Of Fetuses:          1
 Fetal Heart Rate(bpm):   120
 Cardiac Activity:        Observed
 Presentation:            Cephalic
 Placenta:                Ant. post.fundal, left lat

 Amniotic Fluid
 AFI FV:      Within normal limits

 AFI Sum(cm)     %Tile
 12              28
OB History

 Gravidity:    5         Term:   2         SAB:   1
 TOP:          1        Living:  2
Gestational Age

 Clinical EDD:  28w 5d                                        EDD:   [DATE]
 Best:          28w 5d     Det. By:  Clinical EDD             EDD:   [DATE]
Anatomy

 Cranium:               Appears normal         Stomach:                Appears normal, left
                                                                       sided
 Choroid Plexus:        Appears normal         Cord Vessels:           Appears normal (3
                                                                       vessel cord)
 Face:                  Appears normal         Bladder:                Appears normal
                        (orbits and profile)
 Diaphragm:             Appears normal
Cervix Uterus Adnexa

 Left Ovary
 Surgically absent.

 Right Ovary
 Within normal limits.
Myomas

  Site                     L(cm)      W(cm)      D(cm)       Location
  Anterior                 1.6        1          1.1         Subserosal
  Anterior                 1.4        0.9        1.1         Subserosal
 ----------------------------------------------------------------------

  Blood Flow                 RI        PI       Comments

 ----------------------------------------------------------------------
Impression

 Limited exam
 Cephalic presentation
 Normal amniotic fluid and fetal movement
Recommendations

 Follow up as clinically indicated.

## 2019-03-23 NOTE — Discharge Instructions (Signed)
Abdominal Pain During Pregnancy ° °Belly (abdominal) pain is common during pregnancy. There are many possible causes. Most of the time, it is not a serious problem. Other times, it can be a sign that something is wrong with the pregnancy. Always tell your doctor if you have belly pain. °Follow these instructions at home: °· Do not have sex or put anything in your vagina until your pain goes away completely. °· Get plenty of rest until your pain gets better. °· Drink enough fluid to keep your pee (urine) pale yellow. °· Take over-the-counter and prescription medicines only as told by your doctor. °· Keep all follow-up visits as told by your doctor. This is important. °Contact a doctor if: °· Your pain continues or gets worse after resting. °· You have lower belly pain that: °? Comes and goes at regular times. °? Spreads to your back. °? Feels like menstrual cramps. °· You have pain or burning when you pee (urinate). °Get help right away if: °· You have a fever or chills. °· You have vaginal bleeding. °· You are leaking fluid from your vagina. °· You are passing tissue from your vagina. °· You throw up (vomit) for more than 24 hours. °· You have watery poop (diarrhea) for more than 24 hours. °· Your baby is moving less than usual. °· You feel very weak or faint. °· You have shortness of breath. °· You have very bad pain in your upper belly. °Summary °· Belly (abdominal) pain is common during pregnancy. There are many possible causes. °· If you have belly pain during pregnancy, tell your doctor right away. °· Keep all follow-up visits as told by your doctor. This is important. °This information is not intended to replace advice given to you by your health care provider. Make sure you discuss any questions you have with your health care provider. °Document Released: 06/19/2009 Document Revised: 10/19/2018 Document Reviewed: 10/03/2016 °Elsevier Patient Education © 2020 Elsevier Inc. ° °

## 2019-03-23 NOTE — MAU Note (Signed)
Spotting and cramping(in her back and booty).  Started this morning.  No prior bleeding, no placental issues on US.denies constipation, no recent sex.

## 2019-03-23 NOTE — MAU Provider Note (Signed)
History     CSN: [id number]  Arrival date and time: 03/23/19 1159   First Provider Initiated Contact with Patient 03/23/19 1300      Chief Complaint  Patient presents with  . Back Pain  . Vaginal Bleeding   HPI   Anna Burns is a 28 y.o. female with a history of fibroids, [id number] @ [redacted]w[redacted]d here in MAU with complaints of braxton hicks contractions, lower back pain and ? Vaginal bleeding. She saw one streak of blood when she wiped one time this morning. She never saw another streak of blood after that. The one streak was red in color. She has had some lower abdominal pain which she thinks is braxton hicks contractions. She feels the pain in the lower part of her abdomen and sometimes in her lower back. No bleeding currently. + fetal movement.   OB History    Gravida  5   Para  2   Term  2   Preterm      AB  2   Living  2     SAB  1   TAB  1   Ectopic      Multiple  0   Live Births  2        Obstetric Comments  Induced due to HTN, PP hemorrhage w/2019 delivery        Past Medical History:  Diagnosis Date  . Anemia   . Asthma   . Ovarian tumor (benign), left   . Pregnancy induced hypertension     Past Surgical History:  Procedure Laterality Date  . ELBOW SURGERY    . LEFT OOPHORECTOMY  2013    Family History  Problem Relation Age of Onset  . Diabetes Father   . Aneurysm Mother   . Diabetes Mother   . Stroke Mother   . Diabetes Paternal Grandmother     Social History   Tobacco Use  . Smoking status: Never Smoker  . Smokeless tobacco: Never Used  Substance Use Topics  . Alcohol use: No  . Drug use: No    Allergies:  Allergies  Allergen Reactions  . Contrast Media [Iodinated Diagnostic Agents] Itching and Other (See Comments)    Itching on tongue    No medications prior to admission.   Results for orders placed or performed during the hospital encounter of 03/23/19 (from the past 48 hour(s))  Urinalysis, Routine w reflex  microscopic     Status: Abnormal   Collection Time: 03/23/19 12:38 PM  Result Value Ref Range   Color, Urine YELLOW YELLOW   APPearance HAZY (A) CLEAR   Specific Gravity, Urine 1.008 1.005 - 1.030   pH 7.0 5.0 - 8.0   Glucose, UA NEGATIVE NEGATIVE mg/dL   Hgb urine dipstick NEGATIVE NEGATIVE   Bilirubin Urine NEGATIVE NEGATIVE   Ketones, ur NEGATIVE NEGATIVE mg/dL   Protein, ur NEGATIVE NEGATIVE mg/dL   Nitrite NEGATIVE NEGATIVE   Leukocytes,Ua LARGE (A) NEGATIVE   RBC / HPF 0-5 0 - 5 RBC/hpf   WBC, UA 0-5 0 - 5 WBC/hpf   Bacteria, UA RARE (A) NONE SEEN   Squamous Epithelial / LPF 0-5 0 - 5   Mucus PRESENT     Comment: Performed at [facility], [address] [place], [place] [address]  Wet prep, genital     Status: Abnormal   Collection Time: 03/23/19  1:25 PM   Specimen: Cervical/Vaginal swab  Result Value Ref Range   Yeast Wet Prep  HPF POC NONE SEEN NONE SEEN   Trich, Wet Prep NONE SEEN NONE SEEN   Clue Cells Wet Prep HPF POC NONE SEEN NONE SEEN   WBC, Wet Prep HPF POC MANY (A) NONE SEEN   Sperm NONE SEEN     Comment: Performed at [facility], [place] [address] [place], [place] [address]   Review of Systems  Gastrointestinal: Negative for nausea and vomiting.  Genitourinary: Positive for pelvic pain. Negative for dysuria.   Physical Exam   Blood pressure 127/79, pulse 91, temperature 98.7 F (37.1 C), temperature source Oral, resp. rate 18, weight 90.6 kg, SpO2 99 %, unknown if currently breastfeeding.  Physical Exam  Constitutional: She is oriented to person, place, and time. She appears well-developed and well-nourished. No distress.  HENT:  Head: Normocephalic.  Eyes: Pupils are equal, round, and reactive to light.  GI: Soft. She exhibits no distension and no mass. There is no abdominal tenderness. There is no rebound and no guarding.  Genitourinary:    Genitourinary Comments: Vagina - Small amount of white vaginal discharge, no odor  Cervix - No  contact bleeding, no active bleeding  Bimanual exam: Dilation: Closed(outer OS fingertip, inner OS closed.) Effacement (%): Thick Exam by:: J , NP Chaperone present.    Neurological: She is alert and oriented to person, place, and time.  Skin: Skin is warm. She is not diaphoretic.  Psychiatric: Her behavior is normal.   Fetal Tracing: Baseline: 130 bpm Variability: Moderate  Accelerations: 15x15 Decelerations: None Toco: none   MAU Course  Procedures  None  MDM  B positive blood type  Urine culture collected and pending.  Patient declined a dose of procardia.  Oral fluid hydration encouraged.  Preliminary [place] normal with myoma's noted  Patient feeling good and is ready to go home.   Assessment and Plan   A:  1. Abdominal pain in pregnancy   2. [redacted] weeks gestation of pregnancy     P:  Discharge home in stable condition Return to MAU with any amount of vaginal bleeding F/U with OB as needed Pelvic rest Increase oral fluid intake.  Urine culture pending.    Noni Saupe I, NP 03/23/2019 6:03 PM

## 2019-03-24 LAB — GC/CHLAMYDIA PROBE AMP (~~LOC~~) NOT AT ARMC
Chlamydia: NEGATIVE
Neisseria Gonorrhea: NEGATIVE

## 2019-03-25 LAB — CULTURE, OB URINE: Special Requests: NORMAL

## 2019-04-06 NOTE — Progress Notes (Addendum)
Cardiology Office Note    Date:  04/07/2019   ID:  Anna Burns, DOB 02/06/1991, MRN [id number]  PCP:  Patient, No Pcp Per  Cardiologist:  New   Chief Complaint: palpitations   History of Present Illness:   Anna Burns is a 28 y.o. [redacted] weeks pregnant female referred by Fernanda Drum, CNM for palpitations.   CBC 03/17/2019: WBC 7.3, RBC 3.91, hemoglobin 8.9, hematocrit 28.3%  She has alpha trait thalassemia.  OB History    Gravida  5   Para  2   Term  2   Preterm      AB  2   Living  2     SAB  1   TAB  1   Ectopic      Multiple  0   Live Births  2      During her last pregnancy (4th) she has gestational hypertension.  She was induced at 37 weeks.  She had a vaginal delivery.  At that time she had a palpitation but mild.  She had a first-time postpartum hemorrhage requiring transfusion.  She is 31-week pregnant today.  She is having palpitation for past couple of weeks which is more intense than last pregnancy.  She feels palpitation mostly with ambulation and laying down.  Heart rate comes back to normal after a few minutes of rest.  She has dyspnea which seems due to pregnancy.  Denies chest pain, orthopnea, PND, syncope or melena.  She does not know much about her family history but reports mom side has brain aneurysm and father was diabetic.  No family history of premature death.  Past Medical History:  Diagnosis Date  . Anemia   . Asthma   . Fibroid   . Genital herpes simplex 02/27/2018  . Gestational HTN 04/22/2018  . Gestational hypertension 04/22/2018  . H/O unilateral salpingectomy 04/04/2017  . Ovarian tumor (benign), left   . Pregnancy induced hypertension   . Pregnant 03/27/2017  . SAB (spontaneous abortion) 04/04/2017    Past Surgical History:  Procedure Laterality Date  . ELBOW SURGERY    . LEFT OOPHORECTOMY  2013    Current Medications: Prior to Admission medications   Medication Sig Start Date End Date Taking? Authorizing  Provider  albuterol (VENTOLIN HFA) 108 (90 Base) MCG/ACT inhaler Inhale into the lungs every 4 (four) hours as needed for wheezing or shortness of breath.    [provider]  calcium carbonate (TUMS - DOSED IN MG ELEMENTAL CALCIUM) 500 MG chewable tablet Chew 1 tablet by mouth 5 (five) times daily as needed for indigestion or heartburn.    [provider]  ferrous sulfate 325 (65 FE) MG tablet Take 1 tablet (325 mg total) by mouth daily with breakfast. 04/25/18   Marikay Alar, CNM  ibuprofen (ADVIL,MOTRIN) 600 MG tablet Take 1 tablet (600 mg total) by mouth every 6 (six) hours as needed for mild pain or moderate pain. 04/25/18   Marikay Alar, CNM  ondansetron (ZOFRAN) 4 MG tablet Take 4 mg by mouth every 8 (eight) hours as needed for nausea or vomiting.    [provider]    Allergies:   Contrast media [iodinated diagnostic agents]   Social History   Socioeconomic History  . Marital status: Married    Spouse name: Not on file  . Number of children: Not on file  . Years of education: Not on file  . Highest education level: Not on file  Occupational History  .  Not on file  Social Needs  . Financial resource strain: Not on file  . Food insecurity    Worry: Not on file    Inability: Not on file  . Transportation needs    Medical: Not on file    Non-medical: Not on file  Tobacco Use  . Smoking status: Never Smoker  . Smokeless tobacco: Never Used  Substance and Sexual Activity  . Alcohol use: No  . Drug use: No  . Sexual activity: Yes    Birth control/protection: None  Lifestyle  . Physical activity    Days per week: Not on file    Minutes per session: Not on file  . Stress: Not on file  Relationships  . Social Herbalist on phone: Not on file    Gets together: Not on file    Attends religious service: Not on file    Active member of club or organization: Not on file    Attends meetings of clubs or organizations: Not on file     Relationship status: Not on file  Other Topics Concern  . Not on file  Social History Narrative  . Not on file     Family History:  The patient's family history includes Aneurysm in her mother; Diabetes in her father, mother, and paternal grandmother; Stroke in her mother.   ROS:   Please see the history of present illness.    ROS All other systems reviewed and are negative.  PHYSICAL EXAM:   VS:  BP 114/68   Pulse 98   Ht 5\' 3"  (1.6 m)   Wt 200 lb 12.8 oz (91.1 kg)   SpO2 98%   BMI 35.57 kg/m    GEN: Well nourished, well developed, in no acute distress  HEENT: normal  Neck: no JVD, carotid bruits, or masses Cardiac: RRR; no murmurs, rubs, or gallops,no edema  Respiratory:  clear to auscultation bilaterally, normal work of breathing GI: soft, nontender, nondistended, + BS MS: no deformity or atrophy  Skin: warm and dry, no rash Neuro:  Alert and Oriented x 3, Strength and sensation are intact Psych: euthymic mood, full affect  Wt Readings from Last 3 Encounters:  04/07/19 200 lb 12.8 oz (91.1 kg)  03/23/19 199 lb 12.8 oz (90.6 kg)  04/22/18 208 lb 1.6 oz (94.4 kg)      Studies/Labs Reviewed:   EKG:  EKG is ordered today.  The ekg ordered today demonstrates normal sinus rhythm  Recent Labs: 04/22/2018: ALT 10; BUN <5; Creatinine, Ser 0.40; Potassium 3.8; Sodium 137 04/23/2018: Hemoglobin 8.9; Platelets 235   Lipid Panel No results found for: CHOL, TRIG, HDL, CHOLHDL, VLDL, LDLCALC, LDLDIRECT  Additional studies/ records that were reviewed today include:  As summarized above   ASSESSMENT & PLAN:    1. Palpitation She walked in hallway for about 5 minutes with heart rate up to 128 bpm which back to normal limit at rest.  Reviewed with Dr. Caryl Comes.  Seems she is having normal pregnancy.  Advised to increase fluid intake.  Advised to get Apple Watch/repeat to determine better heart rate with ambulation.  Discussed echocardiogram to rule out any structural heart  disease as he was diagnosed with COVID few months ago. Tested negative for COVID multiple time afterwards. She wants to defer monitor or echocardiogram currently.  She will let us know if interested.    Medication Adjustments/Labs and Tests Ordered: Current medicines are reviewed at length with the patient today.  Concerns regarding  medicines are outlined above.  Medication changes, Labs and Tests ordered today are listed in the Patient Instructions below. Patient Instructions  Medication Instructions:   Your physician recommends that you continue on your current medications as directed. Please refer to the Current Medication list given to you today.  If you need a refill on your cardiac medications before your next appointment, please call your pharmacy.   Lab work:  None ordered today  Testing/Procedures:  None ordered today  Follow-Up: At [facility], you and your health needs are our priority.  As part of our continuing mission to provide you with exceptional heart care, we have created designated Provider Care Teams.  These Care Teams include your primary Cardiologist (physician) and Advanced Practice Providers (APPs -  Physician Assistants and Nurse Practitioners) who all work together to provide you with the care you need, when you need it. Follow up as needed.      Jarrett Soho, [place]  04/07/2019 12:15 PM    [facility] [place], [place], [place]  [address] Phone: [phone]; Fax: [phone]

## 2019-04-07 ENCOUNTER — Ambulatory Visit (INDEPENDENT_AMBULATORY_CARE_PROVIDER_SITE_OTHER): Payer: BC Managed Care – PPO | Admitting: Physician Assistant

## 2019-04-07 ENCOUNTER — Encounter: Payer: Self-pay | Admitting: Physician Assistant

## 2019-04-07 ENCOUNTER — Other Ambulatory Visit: Payer: Self-pay

## 2019-04-07 VITALS — BP 114/68 | HR 98 | Ht 63.0 in | Wt 200.8 lb

## 2019-04-07 DIAGNOSIS — R002 Palpitations: Secondary | ICD-10-CM | POA: Diagnosis not present

## 2019-04-07 DIAGNOSIS — Z3A31 31 weeks gestation of pregnancy: Secondary | ICD-10-CM | POA: Diagnosis not present

## 2019-04-07 NOTE — Patient Instructions (Signed)
Medication Instructions:   Your physician recommends that you continue on your current medications as directed. Please refer to the Current Medication list given to you today.  If you need a refill on your cardiac medications before your next appointment, please call your pharmacy.   Lab work:  None ordered today  Testing/Procedures:  None ordered today  Follow-Up: At [facility], you and your health needs are our priority.  As part of our continuing mission to provide you with exceptional heart care, we have created designated Provider Care Teams.  These Care Teams include your primary Cardiologist (physician) and Advanced Practice Providers (APPs -  Physician Assistants and Nurse Practitioners) who all work together to provide you with the care you need, when you need it. Follow up as needed.

## 2019-04-27 DIAGNOSIS — Z3A33 33 weeks gestation of pregnancy: Secondary | ICD-10-CM | POA: Diagnosis not present

## 2019-04-27 DIAGNOSIS — O26849 Uterine size-date discrepancy, unspecified trimester: Secondary | ICD-10-CM | POA: Diagnosis not present

## 2019-05-07 DIAGNOSIS — Z369 Encounter for antenatal screening, unspecified: Secondary | ICD-10-CM | POA: Diagnosis not present

## 2019-05-07 DIAGNOSIS — R519 Headache, unspecified: Secondary | ICD-10-CM | POA: Diagnosis not present

## 2019-05-10 DIAGNOSIS — R102 Pelvic and perineal pain: Secondary | ICD-10-CM | POA: Diagnosis not present

## 2019-05-10 DIAGNOSIS — Z3483 Encounter for supervision of other normal pregnancy, third trimester: Secondary | ICD-10-CM | POA: Diagnosis not present

## 2019-05-11 ENCOUNTER — Encounter (HOSPITAL_COMMUNITY): Payer: Self-pay | Admitting: *Deleted

## 2019-05-11 ENCOUNTER — Inpatient Hospital Stay (HOSPITAL_COMMUNITY)
Admission: AD | Admit: 2019-05-11 | Discharge: 2019-05-11 | Disposition: A | Discharge status: Home / Self Care | Payer: BC Managed Care – PPO | Attending: Obstetrics and Gynecology | Admitting: Obstetrics and Gynecology

## 2019-05-11 ENCOUNTER — Other Ambulatory Visit: Payer: Self-pay | Admitting: Student

## 2019-05-11 ENCOUNTER — Other Ambulatory Visit: Payer: Self-pay

## 2019-05-11 DIAGNOSIS — Z791 Long term (current) use of non-steroidal anti-inflammatories (NSAID): Secondary | ICD-10-CM | POA: Diagnosis not present

## 2019-05-11 DIAGNOSIS — O26893 Other specified pregnancy related conditions, third trimester: Secondary | ICD-10-CM | POA: Insufficient documentation

## 2019-05-11 DIAGNOSIS — O10013 Pre-existing essential hypertension complicating pregnancy, third trimester: Secondary | ICD-10-CM | POA: Insufficient documentation

## 2019-05-11 DIAGNOSIS — D509 Iron deficiency anemia, unspecified: Secondary | ICD-10-CM | POA: Insufficient documentation

## 2019-05-11 DIAGNOSIS — Z79899 Other long term (current) drug therapy: Secondary | ICD-10-CM | POA: Insufficient documentation

## 2019-05-11 DIAGNOSIS — Z3A35 35 weeks gestation of pregnancy: Secondary | ICD-10-CM | POA: Diagnosis not present

## 2019-05-11 DIAGNOSIS — O99013 Anemia complicating pregnancy, third trimester: Secondary | ICD-10-CM | POA: Insufficient documentation

## 2019-05-11 DIAGNOSIS — O99513 Diseases of the respiratory system complicating pregnancy, third trimester: Secondary | ICD-10-CM | POA: Insufficient documentation

## 2019-05-11 DIAGNOSIS — O99012 Anemia complicating pregnancy, second trimester: Secondary | ICD-10-CM

## 2019-05-11 DIAGNOSIS — J45909 Unspecified asthma, uncomplicated: Secondary | ICD-10-CM | POA: Insufficient documentation

## 2019-05-11 DIAGNOSIS — R519 Headache, unspecified: Secondary | ICD-10-CM | POA: Diagnosis not present

## 2019-05-11 LAB — COMPREHENSIVE METABOLIC PANEL
ALT: 10 U/L (ref 0–44)
AST: 15 U/L (ref 15–41)
Albumin: 2.8 g/dL — ABNORMAL LOW (ref 3.5–5.0)
Alkaline Phosphatase: 79 U/L (ref 38–126)
Anion gap: 9 (ref 5–15)
BUN: <5 mg/dL — ABNORMAL LOW (ref 6–20)
CO2: 19 mmol/L — ABNORMAL LOW (ref 22–32)
Calcium: 8.9 mg/dL (ref 8.9–10.3)
Chloride: 107 mmol/L (ref 98–111)
Creatinine, Ser: 0.42 mg/dL — ABNORMAL LOW (ref 0.44–1.00)
GFR calc Af Amer: >60 mL/min (ref >60)
GFR calc non Af Amer: >60 mL/min (ref >60)
Glucose, Bld: 117 mg/dL — ABNORMAL HIGH (ref 70–99)
Potassium: 3.6 mmol/L (ref 3.5–5.1)
Sodium: 135 mmol/L (ref 135–145)
Total Bilirubin: 0.3 mg/dL (ref 0.3–1.2)
Total Protein: 6.1 g/dL — ABNORMAL LOW (ref 6.5–8.1)

## 2019-05-11 LAB — CBC
HCT: 27 % — ABNORMAL LOW (ref 36.0–46.0)
Hemoglobin: 8.6 g/dL — ABNORMAL LOW (ref 12.0–15.0)
MCH: 22.6 pg — ABNORMAL LOW (ref 26.0–34.0)
MCHC: 31.9 g/dL (ref 30.0–36.0)
MCV: 71.1 fL — ABNORMAL LOW (ref 80.0–100.0)
Platelets: 211 10*3/uL (ref 150–400)
RBC: 3.8 MIL/uL — ABNORMAL LOW (ref 3.87–5.11)
RDW: 16.6 % — ABNORMAL HIGH (ref 11.5–15.5)
WBC: 6.7 10*3/uL (ref 4.0–10.5)
nRBC: 0 % (ref 0.0–0.2)

## 2019-05-11 LAB — URINALYSIS, ROUTINE W REFLEX MICROSCOPIC
Bilirubin Urine: NEGATIVE
Glucose, UA: NEGATIVE mg/dL
Hgb urine dipstick: NEGATIVE
Ketones, ur: NEGATIVE mg/dL
Nitrite: NEGATIVE
Protein, ur: NEGATIVE mg/dL
Specific Gravity, Urine: 1.006 (ref 1.005–1.030)
pH: 7 (ref 5.0–8.0)

## 2019-05-11 LAB — PROTEIN / CREATININE RATIO, URINE
Creatinine, Urine: 57.82 mg/dL
Total Protein, Urine: <6 mg/dL

## 2019-05-11 MED ORDER — DEXAMETHASONE SODIUM PHOSPHATE 10 MG/ML IJ SOLN
10.0000 mg | Freq: Once | INTRAMUSCULAR | Status: DC
  Filled 2019-05-11: qty 1

## 2019-05-11 MED ORDER — BUTALBITAL-APAP-CAFFEINE 50-325-40 MG PO TABS
2.0000 | ORAL_TABLET | Freq: Once | ORAL | Status: AC
  Administered 2019-05-11: 2 via ORAL
  Filled 2019-05-11: qty 2

## 2019-05-11 MED ORDER — ACETAMINOPHEN 325 MG PO TABS
325.0000 mg | ORAL_TABLET | Freq: Once | ORAL | Status: AC
  Administered 2019-05-11: 15:00:00 325 mg via ORAL
  Filled 2019-05-11: qty 1

## 2019-05-11 MED ORDER — DIPHENHYDRAMINE HCL 50 MG/ML IJ SOLN
12.5000 mg | Freq: Once | INTRAMUSCULAR | Status: DC
  Filled 2019-05-11: qty 1

## 2019-05-11 MED ORDER — METOCLOPRAMIDE HCL 5 MG/ML IJ SOLN
10.0000 mg | Freq: Once | INTRAMUSCULAR | Status: DC
  Filled 2019-05-11: qty 2

## 2019-05-11 MED ORDER — FERAHEME 510 MG/17ML IV SOLN: 510.0000 mg | Freq: Once | INTRAVENOUS | 0 refills | Status: DC

## 2019-05-11 MED ORDER — ACETAMINOPHEN 500 MG PO TABS: 1000.0000 mg | ORAL_TABLET | Freq: Once | ORAL | Status: DC

## 2019-05-11 MED ORDER — LACTATED RINGERS IV SOLN: INTRAVENOUS | Status: DC

## 2019-05-11 NOTE — Discharge Instructions (Signed)
-continue to take iron twice a day; eat, drink and rest. Call your office if BP is greater than 140/90.  -someone from [facility] will be calling you to schedule an appt. If you do not hear from them, call [phone] on Thursday to make sure they schedule you.   Hypertension During Pregnancy Hypertension is also called high blood pressure. High blood pressure means that the force of your blood moving in your body is too strong. It can cause problems for you and your baby. Different types of high blood pressure can happen during pregnancy. The types are:  High blood pressure before you got pregnant. This is called chronic hypertension.  This can continue during your pregnancy. Your doctor will want to keep checking your blood pressure. You may need medicine to keep your blood pressure under control while you are pregnant. You will need follow-up visits after you have your baby.  High blood pressure that goes up during pregnancy when it was normal before. This is called gestational hypertension. It will usually get better after you have your baby, but your doctor will need to watch your blood pressure to make sure that it is getting better.  Very high blood pressure during pregnancy. This is called preeclampsia. Very high blood pressure is an emergency that needs to be checked and treated right away.  You may develop very high blood pressure after giving birth. This is called postpartum preeclampsia. This usually occurs within 48 hours after childbirth but may occur up to 6 weeks after giving birth. This is rare. How does this affect me? If you have high blood pressure during pregnancy, you have a higher chance of developing high blood pressure:  As you get older.  If you get pregnant again. In some cases, high blood pressure during pregnancy can cause:  Stroke.  Heart attack.  Damage to the kidneys, lungs, or liver.  Preeclampsia.  Jerky movements you cannot control  (convulsions or seizures).  Problems with the placenta. How does this affect my baby? Your baby may:  Be born early.  Not weigh as much as he or she should.  Not handle labor well, leading to a c-section birth. What are the risks?  Having high blood pressure during a past pregnancy.  Being overweight.  Being 68 years old or older.  Being pregnant for the first time.  Being pregnant with more than one baby.  Becoming pregnant using fertility methods, such as IVF.  Having other problems, such as diabetes, or kidney disease.  Having family members who have high blood pressure. What can I do to lower my risk?   Keep a healthy weight.  Eat a healthy diet.  Follow what your doctor tells you about treating any medical problems that you had before becoming pregnant. It is very important to go to all of your doctor visits. Your doctor will check your blood pressure and make sure that your pregnancy is progressing as it should. Treatment should start early if a problem is found. How is this treated? Treatment for high blood pressure during pregnancy can differ depending on the type of high blood pressure you have and how serious it is.  You may need to take blood pressure medicine.  If you have been taking medicine for your blood pressure, you may need to change the medicine during pregnancy if it is not safe for your baby.  If your doctor thinks that you could get very high blood pressure, he or she may  tell you to take a low-dose aspirin during your pregnancy.  If you have very high blood pressure, you may need to stay in the hospital so you and your baby can be watched closely. You may also need to take medicine to lower your blood pressure. This medicine may be given by mouth or through an IV tube.  In some cases, if your condition gets worse, you may need to have your baby early. Follow these instructions at home: Eating and drinking   Drink enough fluid to keep your  pee (urine) pale yellow.  Avoid caffeine. Lifestyle  Do not use any products that contain nicotine or tobacco, such as cigarettes, e-cigarettes, and chewing tobacco. If you need help quitting, ask your doctor.  Do not use alcohol or drugs.  Avoid stress.  Rest and get plenty of sleep.  Regular exercise can help. Ask your doctor what kinds of exercise are best for you. General instructions  Take over-the-counter and prescription medicines only as told by your doctor.  Keep all prenatal and follow-up visits as told by your doctor. This is important. Contact a doctor if:  You have symptoms that your doctor told you to watch for, such as: ? Headaches. ? Nausea. ? Vomiting. ? Belly (abdominal) pain. ? Dizziness. ? Light-headedness. Get help right away if:  You have: ? Very bad belly pain that does not get better with treatment. ? A very bad headache that does not get better. ? Vomiting that does not get better. ? Sudden, fast weight gain. ? Sudden swelling in your hands, ankles, or face. ? Bleeding from your vagina. ? Blood in your pee. ? Blurry vision. ? Double vision. ? Shortness of breath. ? Chest pain. ? Weakness on one side of your body. ? Trouble talking.  Your baby is not moving as much as usual. Summary  High blood pressure is also called hypertension.  High blood pressure means that the force of your blood moving in your body is too strong.  High blood pressure can cause problems for you and your baby.  Keep all follow-up visits as told by your doctor. This is important. This information is not intended to replace advice given to you by your health care provider. Make sure you discuss any questions you have with your health care provider. Document Released: 08/03/2010 Document Revised: 10/22/2018 Document Reviewed: 07/28/2018 Elsevier Patient Education  2020 [employer].

## 2019-05-11 NOTE — MAU Provider Note (Signed)
Patient Anna Burns is a 28 y.o.  [id number] At  [redacted]w[redacted]d here with complaints of headache since Friday (4 days ago).  She had GHTN with her last pregnancy and was induced for that at 37 weeks. She was told by her doctor to watch her blood pressure and so patient has been taking her blood pressure daily. She just started taking her blood pressure on Friday.  She says that her BP has been elevated at home; but she does not trust the CMAs   She had a really bad hemorrhage with her son, plus COVID in June and she doesn't have time to "play around".   BP at home this morning which was 137/96; she has been taking it home and it has consistently been 130/90s.   She denies LOF, decreased fetal movements, VB, dysuria. She gets woozy and dizzy when she gets her headache, which is what concerns her. She is taking her iron twice a day.   History     CSN: [id number]  Arrival date and time: 05/11/19 1105   None     Chief Complaint  Patient presents with  . Headache  . Hypertension   Headache  The current episode started in the past 7 days. The pain quality is not similar to prior headaches. The quality of the pain is described as aching ("On the top" of her head). The pain is at a severity of 6/10. Pertinent negatives include no abdominal pain, blurred vision, coughing, neck pain, visual change, vomiting or weakness. She has tried acetaminophen (Tylenol works and then it "bounces right back". ) for the symptoms. The treatment provided mild relief. Her past medical history is significant for hypertension.  Hypertension Associated symptoms include headaches. Pertinent negatives include no blurred vision or neck pain.  She is taking her BP at home using a wrist.   OB History    Gravida  5   Para  2   Term  2   Preterm      AB  2   Living  2     SAB  1   TAB  1   Ectopic      Multiple  0   Live Births  2        Obstetric Comments  Induced due to HTN, PP hemorrhage w/2019  delivery        Past Medical History:  Diagnosis Date  . Anemia   . Asthma   . Fibroid   . Genital herpes simplex 02/27/2018  . Gestational HTN 04/22/2018  . Gestational hypertension 04/22/2018  . H/O unilateral salpingectomy 04/04/2017  . Ovarian tumor (benign), left   . Pregnancy induced hypertension   . Pregnant 03/27/2017  . SAB (spontaneous abortion) 04/04/2017    Past Surgical History:  Procedure Laterality Date  . ELBOW SURGERY    . LEFT OOPHORECTOMY  2013    Family History  Problem Relation Age of Onset  . Diabetes Father   . Aneurysm Mother   . Diabetes Mother   . Stroke Mother   . Diabetes Paternal Grandmother     Social History   Tobacco Use  . Smoking status: Never Smoker  . Smokeless tobacco: Never Used  Substance Use Topics  . Alcohol use: No  . Drug use: No    Allergies:  Allergies  Allergen Reactions  . Contrast Media [Iodinated Diagnostic Agents] Itching and Other (See Comments)    Itching on tongue    Medications Prior to Admission  Medication Sig  Dispense Refill Last Dose  . calcium carbonate (TUMS - DOSED IN MG ELEMENTAL CALCIUM) 500 MG chewable tablet Chew 1 tablet by mouth 5 (five) times daily as needed for indigestion or heartburn.   05/11/2019 at 1030  . albuterol (VENTOLIN HFA) 108 (90 Base) MCG/ACT inhaler Inhale into the lungs every 4 (four) hours as needed for wheezing or shortness of breath.   More than a month at Unknown time  . ferrous sulfate 325 (65 FE) MG tablet Take 1 tablet (325 mg total) by mouth daily with breakfast. 30 tablet 2   . ibuprofen (ADVIL,MOTRIN) 600 MG tablet Take 1 tablet (600 mg total) by mouth every 6 (six) hours as needed for mild pain or moderate pain. 30 tablet 0   . ondansetron (ZOFRAN) 4 MG tablet Take 4 mg by mouth every 8 (eight) hours as needed for nausea or vomiting.       Review of Systems  Constitutional: Negative.   HENT: Negative.   Eyes: Negative for blurred vision.  Respiratory: Negative for  cough.   Gastrointestinal: Negative for abdominal pain and vomiting.  Musculoskeletal: Negative for neck pain.  Neurological: Positive for headaches. Negative for weakness.  Psychiatric/Behavioral: Negative.    Physical Exam   Blood pressure 105/77, pulse 100, temperature 98.1 F (36.7 C), resp. rate 18, height 5\' 3"  (1.6 m), weight 93.1 kg, SpO2 100 %, unknown if currently breastfeeding.  Physical Exam  Constitutional: She appears well-developed.  HENT:  Head: Normocephalic.  Neck: Normal range of motion.  Respiratory: Effort normal.  GI: Soft.  Neurological: She is alert.  Skin: Skin is warm and dry.  Psychiatric: She has a normal mood and affect.    MAU Course  Procedures  MDM  NST:  -ordered IV headache cocktail but patient refused; she would like PO meds. Given Fioricet x 2, yet patient's HA is now a 8/10/. She continues to refuse IV headache, even though Dr. Alwyn Pea has also communicated with patient by phone to take HA cocktail.   -Patient agrees to another 325 mg of Tylenol.  -Patient strongly desires to discharge but agrees to try Tylenol, snack and juice and wait for PCR to come back.  1450: PCR is to low to measure.   Patient Vitals for the past 24 hrs:  BP Temp Pulse Resp SpO2 Height Weight  05/11/19 1345 105/77 - 100 - - - -  05/11/19 1300 123/84 - (!) 101 - - - -  05/11/19 1245 128/83 - (!) 106 - - - -  05/11/19 1215 135/87 - (!) 110 - - - -  05/11/19 1200 132/89 - (!) 115 - - - -  05/11/19 1145 129/88 - (!) 122 - - - -  05/11/19 1121 129/81 98.1 F (36.7 C) (!) 115 18 100 % 5\' 3"  (1.6 m) 93.1 kg    Assessment and Plan    1. Iron deficiency anemia, unspecified iron deficiency anemia type    2. Patient feels better after juice and crackers; strongly desires discharge. Dr. Alwyn Pea was made aware of patient's presence in MAU; agrees that it is appropriate to send her home if all lab work is normal. HA is still present, but patient has normal blood work and  normal BPs, low index of suspicion for pre-e or GHTN. HA may be due to Hgb of 8.6.   3. Message sent to Dorothea Glassman at Short Stay for outpatient Feraheme infusion; advised Ms. Gillian Scarce to call Dr. Alwyn Pea if she has any problem scheduling patient.  Patient knows that Short Stay will be calling her to schedule.   4. Continue to take Iron twice a day; monitor BP at home and call her office if her BP is elevated.    Mervyn Skeeters  05/11/2019, 2:29 PM

## 2019-05-11 NOTE — MAU Note (Signed)
Really don't feel good.  Went to bed with a HA, woke up with a HA. 137/89 last night, down a little this morning.  Took Tylenol, "it let up, it just came back".  Had GHTN with last preg, was induced for that, had PPH.  BP has been up and down with this preg.  Left eye is hurting, denies visual change, epigastric pain or increase in swelling.Marland Kitchen

## 2019-05-12 ENCOUNTER — Encounter (HOSPITAL_COMMUNITY): Payer: Self-pay

## 2019-05-12 ENCOUNTER — Other Ambulatory Visit: Payer: Self-pay

## 2019-05-12 ENCOUNTER — Inpatient Hospital Stay (HOSPITAL_COMMUNITY)
Admission: AD | Admit: 2019-05-12 | Discharge: 2019-05-12 | Disposition: A | Discharge status: Home / Self Care | Payer: BC Managed Care – PPO | Attending: Obstetrics & Gynecology | Admitting: Obstetrics & Gynecology

## 2019-05-12 DIAGNOSIS — Z91041 Radiographic dye allergy status: Secondary | ICD-10-CM | POA: Diagnosis not present

## 2019-05-12 DIAGNOSIS — Z8759 Personal history of other complications of pregnancy, childbirth and the puerperium: Secondary | ICD-10-CM | POA: Diagnosis not present

## 2019-05-12 DIAGNOSIS — Z79899 Other long term (current) drug therapy: Secondary | ICD-10-CM | POA: Diagnosis not present

## 2019-05-12 DIAGNOSIS — O99513 Diseases of the respiratory system complicating pregnancy, third trimester: Secondary | ICD-10-CM | POA: Insufficient documentation

## 2019-05-12 DIAGNOSIS — Z3A36 36 weeks gestation of pregnancy: Secondary | ICD-10-CM | POA: Diagnosis not present

## 2019-05-12 DIAGNOSIS — J45909 Unspecified asthma, uncomplicated: Secondary | ICD-10-CM | POA: Insufficient documentation

## 2019-05-12 DIAGNOSIS — O26893 Other specified pregnancy related conditions, third trimester: Secondary | ICD-10-CM

## 2019-05-12 DIAGNOSIS — R519 Headache, unspecified: Secondary | ICD-10-CM | POA: Diagnosis not present

## 2019-05-12 DIAGNOSIS — Z90721 Acquired absence of ovaries, unilateral: Secondary | ICD-10-CM | POA: Diagnosis not present

## 2019-05-12 DIAGNOSIS — Z833 Family history of diabetes mellitus: Secondary | ICD-10-CM | POA: Insufficient documentation

## 2019-05-12 DIAGNOSIS — O99891 Other specified diseases and conditions complicating pregnancy: Secondary | ICD-10-CM | POA: Insufficient documentation

## 2019-05-12 LAB — CBC WITH DIFFERENTIAL/PLATELET
Abs Immature Granulocytes: 0.02 10*3/uL (ref 0.00–0.07)
Basophils Absolute: 0 10*3/uL (ref 0.0–0.1)
Basophils Relative: 0 %
Eosinophils Absolute: 0.1 10*3/uL (ref 0.0–0.5)
Eosinophils Relative: 1 %
HCT: 27.9 % — ABNORMAL LOW (ref 36.0–46.0)
Hemoglobin: 8.7 g/dL — ABNORMAL LOW (ref 12.0–15.0)
Immature Granulocytes: 0 %
Lymphocytes Relative: 22 %
Lymphs Abs: 1.7 10*3/uL (ref 0.7–4.0)
MCH: 22.4 pg — ABNORMAL LOW (ref 26.0–34.0)
MCHC: 31.2 g/dL (ref 30.0–36.0)
MCV: 71.9 fL — ABNORMAL LOW (ref 80.0–100.0)
Monocytes Absolute: 0.5 10*3/uL (ref 0.1–1.0)
Monocytes Relative: 7 %
Neutro Abs: 5.4 10*3/uL (ref 1.7–7.7)
Neutrophils Relative %: 70 %
Platelets: 225 10*3/uL (ref 150–400)
RBC: 3.88 MIL/uL (ref 3.87–5.11)
RDW: 16.6 % — ABNORMAL HIGH (ref 11.5–15.5)
WBC: 7.7 10*3/uL (ref 4.0–10.5)
nRBC: 0 % (ref 0.0–0.2)

## 2019-05-12 LAB — COMPREHENSIVE METABOLIC PANEL
ALT: 10 U/L (ref 0–44)
AST: 16 U/L (ref 15–41)
Albumin: 2.9 g/dL — ABNORMAL LOW (ref 3.5–5.0)
Alkaline Phosphatase: 92 U/L (ref 38–126)
Anion gap: 9 (ref 5–15)
BUN: <5 mg/dL — ABNORMAL LOW (ref 6–20)
CO2: 20 mmol/L — ABNORMAL LOW (ref 22–32)
Calcium: 9.2 mg/dL (ref 8.9–10.3)
Chloride: 106 mmol/L (ref 98–111)
Creatinine, Ser: 0.43 mg/dL — ABNORMAL LOW (ref 0.44–1.00)
GFR calc Af Amer: >60 mL/min (ref >60)
GFR calc non Af Amer: >60 mL/min (ref >60)
Glucose, Bld: 112 mg/dL — ABNORMAL HIGH (ref 70–99)
Potassium: 3.2 mmol/L — ABNORMAL LOW (ref 3.5–5.1)
Sodium: 135 mmol/L (ref 135–145)
Total Bilirubin: 0.1 mg/dL — ABNORMAL LOW (ref 0.3–1.2)
Total Protein: 6.2 g/dL — ABNORMAL LOW (ref 6.5–8.1)

## 2019-05-12 LAB — URINALYSIS, ROUTINE W REFLEX MICROSCOPIC
Bilirubin Urine: NEGATIVE
Glucose, UA: NEGATIVE mg/dL
Hgb urine dipstick: NEGATIVE
Ketones, ur: NEGATIVE mg/dL
Nitrite: NEGATIVE
Protein, ur: NEGATIVE mg/dL
Specific Gravity, Urine: 1.008 (ref 1.005–1.030)
pH: 7 (ref 5.0–8.0)

## 2019-05-12 LAB — PROTEIN / CREATININE RATIO, URINE
Creatinine, Urine: 62.49 mg/dL
Total Protein, Urine: <6 mg/dL

## 2019-05-12 MED ORDER — DEXAMETHASONE SODIUM PHOSPHATE 10 MG/ML IJ SOLN
10.0000 mg | Freq: Once | INTRAMUSCULAR | Status: DC
  Filled 2019-05-12: qty 1

## 2019-05-12 MED ORDER — SODIUM CHLORIDE 0.9 % IV SOLN
Freq: Once | INTRAVENOUS | Status: AC
  Administered 2019-05-12: 19:00:00 via INTRAVENOUS

## 2019-05-12 MED ORDER — CYCLOBENZAPRINE HCL 10 MG PO TABS: 10.0000 mg | ORAL_TABLET | Freq: Once | ORAL | Status: DC

## 2019-05-12 MED ORDER — DIPHENHYDRAMINE HCL 50 MG/ML IJ SOLN
25.0000 mg | Freq: Once | INTRAMUSCULAR | Status: DC
  Filled 2019-05-12: qty 1

## 2019-05-12 MED ORDER — METOCLOPRAMIDE HCL 5 MG/ML IJ SOLN
10.0000 mg | Freq: Once | INTRAMUSCULAR | Status: DC
  Filled 2019-05-12: qty 2

## 2019-05-12 NOTE — Discharge Instructions (Signed)
Tension Headache, Adult A tension headache is pain, pressure, or aching in your head. Tension headaches can last from 30 minutes to several days. Follow these instructions at home: Managing pain  Take over-the-counter and prescription medicines only as told by your doctor.  When you have a headache, lie down in a dark, quiet room.  If told, put ice on your head and neck: ? Put ice in a plastic bag. ? Place a towel between your skin and the bag. ? Leave the ice on for 20 minutes, 2-3 times a day.  If told, put heat on the back of your neck. Do this as often as your doctor tells you to. Use the kind of heat that your doctor recommends, such as a moist heat pack or a heating pad. ? Place a towel between your skin and the heat. ? Leave the heat on for 20-30 minutes. ? Remove the heat if your skin turns bright red. Eating and drinking  Eat meals on a regular schedule.  Watch how much alcohol you drink: ? If you are a woman and are not pregnant, do not drink more than 1 drink a day. ? If you are a man, do not drink more than 2 drinks a day.  Drink enough fluid to keep your pee (urine) pale yellow.  Do not use a lot of caffeine, or stop using caffeine. Lifestyle  Get enough sleep. Get 7-9 hours of sleep each night. Or get the amount of sleep that your doctor tells you to.  At bedtime, remove all electronic devices from your room. Examples of electronic devices are computers, phones, and tablets.  Find ways to lessen your stress. Some things that can lessen stress are: ? Exercise. ? Deep breathing. ? Yoga. ? Music. ? Positive thoughts.  Sit up straight. Do not tighten (tense) your muscles.  Do not use any products that have nicotine or tobacco in them, such as cigarettes and e-cigarettes. If you need help quitting, ask your doctor. General instructions   Keep all follow-up visits as told by your doctor. This is important.  Avoid things that can bring on headaches. Keep a  journal to find out if certain things bring on headaches. For example, write down: ? What you eat and drink. ? How much sleep you get. ? Any change to your diet or medicines. Contact a doctor if:  Your headache does not get better.  Your headache comes back.  You have a headache and sounds, light, or smells bother you.  You feel sick to your stomach (nauseous) or you throw up (vomit).  Your stomach hurts. Get help right away if:  You suddenly get a very bad headache along with any of these: ? A stiff neck. ? Feeling sick to your stomach. ? Throwing up. ? Feeling weak. ? Trouble seeing. ? Feeling short of breath. ? A rash. ? Feeling unusually sleepy. ? Trouble speaking. ? Pain in your eye or ear. ? Trouble walking or balancing. ? Feeling like you will pass out (faint). ? Passing out. Summary  A tension headache is pain, pressure, or aching in your head.  Tension headaches can last from 30 minutes to several days.  Lifestyle changes and medicines may help relieve pain. This information is not intended to replace advice given to you by your health care provider. Make sure you discuss any questions you have with your health care provider. Document Released: 09/25/2009 Document Revised: 06/13/2017 Document Reviewed: 10/11/2016 Elsevier Patient Education  2020 Elsevier   Inc. Migraine Headache A migraine headache is a very strong throbbing pain on one side or both sides of your head. This type of headache can also cause other symptoms. It can last from 4 hours to 3 days. Talk with your doctor about what things may bring on (trigger) this condition. What are the causes? The exact cause of this condition is not known. This condition may be triggered or caused by:  Drinking alcohol.  Smoking.  Taking medicines, such as: ? Medicine used to treat chest pain (nitroglycerin). ? Birth control pills. ? Estrogen. ? Some blood pressure medicines.  Eating or drinking certain  products.  Doing physical activity. Other things that may trigger a migraine headache include:  Having a menstrual period.  Pregnancy.  Hunger.  Stress.  Not getting enough sleep or getting too much sleep.  Weather changes.  Tiredness (fatigue). What increases the risk?  Being 65-27 years old.  Being female.  Having a family history of migraine headaches.  Being Caucasian.  Having depression or anxiety.  Being very overweight. What are the signs or symptoms?  A throbbing pain. This pain may: ? Happen in any area of the head, such as on one side or both sides. ? Make it hard to do daily activities. ? Get worse with physical activity. ? Get worse around bright lights or loud noises.  Other symptoms may include: ? Feeling sick to your stomach (nauseous). ? Vomiting. ? Dizziness. ? Being sensitive to bright lights, loud noises, or smells.  Before you get a migraine headache, you may get warning signs (an aura). An aura may include: ? Seeing flashing lights or having blind spots. ? Seeing bright spots, halos, or zigzag lines. ? Having tunnel vision or blurred vision. ? Having numbness or a tingling feeling. ? Having trouble talking. ? Having weak muscles.  Some people have symptoms after a migraine headache (postdromal phase), such as: ? Tiredness. ? Trouble thinking (concentrating). How is this treated?  Taking medicines that: ? Relieve pain. ? Relieve the feeling of being sick to your stomach. ? Prevent migraine headaches.  Treatment may also include: ? Having acupuncture. ? Avoiding foods that bring on migraine headaches. ? Learning ways to control your body functions (biofeedback). ? Therapy to help you know and deal with negative thoughts (cognitive behavioral therapy). Follow these instructions at home: Medicines  Take over-the-counter and prescription medicines only as told by your doctor.  Ask your doctor if the medicine prescribed to  you: ? Requires you to avoid driving or using heavy machinery. ? Can cause trouble pooping (constipation). You may need to take these steps to prevent or treat trouble pooping:  Drink enough fluid to keep your pee (urine) pale yellow.  Take over-the-counter or prescription medicines.  Eat foods that are high in fiber. These include beans, whole grains, and fresh fruits and vegetables.  Limit foods that are high in fat and sugar. These include fried or sweet foods. Lifestyle  Do not drink alcohol.  Do not use any products that contain nicotine or tobacco, such as cigarettes, e-cigarettes, and chewing tobacco. If you need help quitting, ask your doctor.  Get at least 8 hours of sleep every night.  Limit and deal with stress. General instructions      Keep a journal to find out what may bring on your migraine headaches. For example, write down: ? What you eat and drink. ? How much sleep you get. ? Any change in what you eat or drink. ?  Any change in your medicines.  If you have a migraine headache: ? Avoid things that make your symptoms worse, such as bright lights. ? It may help to lie down in a dark, quiet room. ? Do not drive or use heavy machinery. ? Ask your doctor what activities are safe for you.  Keep all follow-up visits as told by your doctor. This is important. Contact a doctor if:  You get a migraine headache that is different or worse than others you have had.  You have more than 15 headache days in one month. Get help right away if:  Your migraine headache gets very bad.  Your migraine headache lasts longer than 72 hours.  You have a fever.  You have a stiff neck.  You have trouble seeing.  Your muscles feel weak or like you cannot control them.  You start to lose your balance a lot.  You start to have trouble walking.  You pass out (faint).  You have a seizure. Summary  A migraine headache is a very strong throbbing pain on one side or  both sides of your head. These headaches can also cause other symptoms.  This condition may be treated with medicines and changes to your lifestyle.  Keep a journal to find out what may bring on your migraine headaches.  Contact a doctor if you get a migraine headache that is different or worse than others you have had.  Contact your doctor if you have more than 15 headache days in a month. This information is not intended to replace advice given to you by your health care provider. Make sure you discuss any questions you have with your health care provider. Document Released: 04/09/2008 Document Revised: 10/23/2018 Document Reviewed: 08/13/2018 Elsevier Patient Education  2020 [employer].

## 2019-05-12 NOTE — MAU Note (Signed)
HA back.  Is checking BP at home, was up,  Sent to MD, Dr Alwyn Pea, wanted her to come in and get checked out.  States she is not ready to have this baby or be induced. Back is hurting some now.  Denies visual changes, epigastric pain or swelling.

## 2019-05-12 NOTE — MAU Provider Note (Signed)
History     CSN: [id number]  Arrival date and time: 05/12/19 1740   First Provider Initiated Contact with Patient 05/12/19 1820      No chief complaint on file.  HPI  Anna Burns is a 28 y.o. [id number] at [redacted]w[redacted]d who presents to MAU today with complaint of headache. The patient was seen in MAU yesterday with the same complaint. She also states that BP is elevated at home using her own cuff up to 150/93. She had all normal BP here yesterday and on all prenatal records for this pregnancy. She does have a history of GHTN with a previous pregnancy. She denies scotoma, RUQ pain, LE edema today. Normal fetal movement noted. Few contractions. No vaginal bleeding or LOF.    OB History    Gravida  5   Para  2   Term  2   Preterm      AB  2   Living  2     SAB  1   TAB  1   Ectopic      Multiple  0   Live Births  2        Obstetric Comments  Induced due to HTN, PP hemorrhage w/2019 delivery        Past Medical History:  Diagnosis Date  . Anemia   . Asthma   . Fibroid   . Genital herpes simplex 02/27/2018  . Gestational HTN 04/22/2018  . Gestational hypertension 04/22/2018  . H/O unilateral salpingectomy 04/04/2017  . Ovarian tumor (benign), left   . Pregnancy induced hypertension   . Pregnant 03/27/2017  . SAB (spontaneous abortion) 04/04/2017    Past Surgical History:  Procedure Laterality Date  . ELBOW SURGERY    . LEFT OOPHORECTOMY  2013    Family History  Problem Relation Age of Onset  . Diabetes Father   . Aneurysm Mother   . Diabetes Mother   . Stroke Mother   . Diabetes Paternal Grandmother     Social History   Tobacco Use  . Smoking status: Never Smoker  . Smokeless tobacco: Never Used  Substance Use Topics  . Alcohol use: No  . Drug use: No    Allergies:  Allergies  Allergen Reactions  . Contrast Media [Iodinated Diagnostic Agents] Itching and Other (See Comments)    Itching on tongue    Medications Prior to Admission   Medication Sig Dispense Refill Last Dose  . ferrous sulfate 325 (65 FE) MG tablet Take 1 tablet (325 mg total) by mouth daily with breakfast. 30 tablet 2 05/12/2019 at Unknown time  . albuterol (VENTOLIN HFA) 108 (90 Base) MCG/ACT inhaler Inhale into the lungs every 4 (four) hours as needed for wheezing or shortness of breath.     . calcium carbonate (TUMS - DOSED IN MG ELEMENTAL CALCIUM) 500 MG chewable tablet Chew 1 tablet by mouth 5 (five) times daily as needed for indigestion or heartburn.     . ferumoxytol (FERAHEME) 510 MG/17ML SOLN injection Inject 17 mLs (510 mg total) into the vein once for 1 dose. 17 mL 0     Review of Systems  Constitutional: Negative for fever.  Eyes: Negative for visual disturbance.  Cardiovascular: Negative for leg swelling.  Gastrointestinal: Positive for abdominal pain. Negative for constipation, diarrhea, nausea and vomiting.  Genitourinary: Negative for vaginal bleeding and vaginal discharge.  Neurological: Positive for headaches.   Physical Exam   Blood pressure 132/80, pulse 93, temperature 98.2 F (36.8 C), resp. rate  18, SpO2 99 %, unknown if currently breastfeeding.  Physical Exam  Nursing note and vitals reviewed. Constitutional: She is oriented to person, place, and time. She appears well-developed and well-nourished. No distress.  HENT:  Head: Normocephalic and atraumatic.  Cardiovascular: Intact distal pulses. Tachycardia present.  Respiratory: Effort normal.  GI: Soft. She exhibits no distension and no mass. There is no abdominal tenderness. There is no rebound and no guarding.  Musculoskeletal:        General: No edema.  Neurological: She is alert and oriented to person, place, and time. She has normal reflexes.  No clonus  Skin: Skin is warm and dry. No erythema.  Psychiatric: She has a normal mood and affect.  Dilation: 1.5 Effacement (%): Thick Exam by:: Kerry Hough, PA  Results for orders placed or performed during the  hospital encounter of 05/12/19 (from the past 24 hour(s))  Protein / creatinine ratio, urine     Status: None   Collection Time: 05/12/19  5:45 PM  Result Value Ref Range   Creatinine, Urine 62.49 mg/dL   Total Protein, Urine <6 mg/dL   Protein Creatinine Ratio        0.00 - 0.15 mg/mg[Cre]  Urinalysis, Routine w reflex microscopic     Status: Abnormal   Collection Time: 05/12/19  5:45 PM  Result Value Ref Range   Color, Urine YELLOW YELLOW   APPearance CLOUDY (A) CLEAR   Specific Gravity, Urine 1.008 1.005 - 1.030   pH 7.0 5.0 - 8.0   Glucose, UA NEGATIVE NEGATIVE mg/dL   Hgb urine dipstick NEGATIVE NEGATIVE   Bilirubin Urine NEGATIVE NEGATIVE   Ketones, ur NEGATIVE NEGATIVE mg/dL   Protein, ur NEGATIVE NEGATIVE mg/dL   Nitrite NEGATIVE NEGATIVE   Leukocytes,Ua TRACE (A) NEGATIVE   RBC / HPF 0-5 0 - 5 RBC/hpf   WBC, UA 0-5 0 - 5 WBC/hpf   Bacteria, UA RARE (A) NONE SEEN   Squamous Epithelial / LPF 6-10 0 - 5   Mucus PRESENT   CBC with Differential/Platelet     Status: Abnormal   Collection Time: 05/12/19  6:09 PM  Result Value Ref Range   WBC 7.7 4.0 - 10.5 K/uL   RBC 3.88 3.87 - 5.11 MIL/uL   Hemoglobin 8.7 (L) 12.0 - 15.0 g/dL   HCT 27.9 (L) 36.0 - 46.0 %   MCV 71.9 (L) 80.0 - 100.0 fL   MCH 22.4 (L) 26.0 - 34.0 pg   MCHC 31.2 30.0 - 36.0 g/dL   RDW 16.6 (H) 11.5 - 15.5 %   Platelets 225 150 - 400 K/uL   nRBC 0.0 0.0 - 0.2 %   Neutrophils Relative % 70 %   Neutro Abs 5.4 1.7 - 7.7 K/uL   Lymphocytes Relative 22 %   Lymphs Abs 1.7 0.7 - 4.0 K/uL   Monocytes Relative 7 %   Monocytes Absolute 0.5 0.1 - 1.0 K/uL   Eosinophils Relative 1 %   Eosinophils Absolute 0.1 0.0 - 0.5 K/uL   Basophils Relative 0 %   Basophils Absolute 0.0 0.0 - 0.1 K/uL   Immature Granulocytes 0 %   Abs Immature Granulocytes 0.02 0.00 - 0.07 K/uL  Comprehensive metabolic panel     Status: Abnormal   Collection Time: 05/12/19  6:09 PM  Result Value Ref Range   Sodium 135 135 - 145 mmol/L    Potassium 3.2 (L) 3.5 - 5.1 mmol/L   Chloride 106 98 - 111 mmol/L   CO2 20 (L)  22 - 32 mmol/L   Glucose, Bld 112 (H) 70 - 99 mg/dL   BUN <5 (L) 6 - 20 mg/dL   Creatinine, Ser 0.43 (L) 0.44 - 1.00 mg/dL   Calcium 9.2 8.9 - 10.3 mg/dL   Total Protein 6.2 (L) 6.5 - 8.1 g/dL   Albumin 2.9 (L) 3.5 - 5.0 g/dL   AST 16 15 - 41 U/L   ALT 10 0 - 44 U/L   Alkaline Phosphatase 92 38 - 126 U/L   Total Bilirubin 0.1 (L) 0.3 - 1.2 mg/dL   GFR calc non Af Amer >60 >60 mL/min   GFR calc Af Amer >60 >60 mL/min   Anion gap 9 5 - 15    Fetal Monitoring: Baseline: 140 bpm Variability: moderate Accelerations: 15 x 15 Decelerations: none  Contractions: q 4-6 minutes, then intermittent, mild   Patient Vitals for the past 24 hrs:  BP Temp Pulse Resp SpO2  05/12/19 1944 132/80 - 93 - -  05/12/19 1845 122/80 - (!) 102 - 99 %  05/12/19 1830 132/83 - (!) 104 - 99 %  05/12/19 1819 129/84 - (!) 105 - 99 %  05/12/19 1755 136/82 98.2 F (36.8 C) (!) 103 18 100 %    MAU Course  Procedures None  MDM Serial BPs CBC, CMP, UA and urine protein/creatinine ratio ordered IV NS bolus, 25 mg Benadryl, 10 mg Decadron, 10 mg Reglan ordered Patient refused headache cocktail  IV bolus started, offered Flexeril instead, patient is amenable initially and then refused.  Patient requesting a cervical exam. Cervix unchanged from yesterday.   Patient is requesting that we wait until IV bolus is complete before considering other medications for headache. Labs without indication of pre-eclampsia. BP continues to be normal in MAU today. Patient has BP cuff at home. Advised to continue to check as directed. Warning symptoms for pre-eclampsia discussed. Patient refuses Flexeril and headache cocktail and is requesting discharge.   Assessment and Plan  A:  SIUP at [redacted]w[redacted]d Headache   P:  Discharge home Pre-eclampsia precautions discussed Patient advised to follow-up with Dr. Alwyn Pea at [facility] as scheduled or sooner if symptoms  worsen  Patient may return to MAU as needed or if her condition were to change or worsen  Kerry Hough, PA-C 05/12/2019, 7:45 PM

## 2019-05-17 ENCOUNTER — Inpatient Hospital Stay (HOSPITAL_COMMUNITY)
Admission: AD | Admit: 2019-05-17 | Discharge: 2019-05-22 | DRG: 807 | Disposition: A | Discharge status: Home / Self Care | Payer: BC Managed Care – PPO | Attending: Obstetrics & Gynecology | Admitting: Obstetrics & Gynecology

## 2019-05-17 ENCOUNTER — Other Ambulatory Visit: Payer: Self-pay

## 2019-05-17 ENCOUNTER — Encounter (HOSPITAL_COMMUNITY): Payer: Self-pay | Admitting: *Deleted

## 2019-05-17 DIAGNOSIS — O9081 Anemia of the puerperium: Secondary | ICD-10-CM | POA: Diagnosis not present

## 2019-05-17 DIAGNOSIS — O9952 Diseases of the respiratory system complicating childbirth: Secondary | ICD-10-CM | POA: Diagnosis present

## 2019-05-17 DIAGNOSIS — O134 Gestational [pregnancy-induced] hypertension without significant proteinuria, complicating childbirth: Principal | ICD-10-CM | POA: Diagnosis present

## 2019-05-17 DIAGNOSIS — Z8759 Personal history of other complications of pregnancy, childbirth and the puerperium: Secondary | ICD-10-CM | POA: Diagnosis not present

## 2019-05-17 DIAGNOSIS — O139 Gestational [pregnancy-induced] hypertension without significant proteinuria, unspecified trimester: Secondary | ICD-10-CM | POA: Diagnosis present

## 2019-05-17 DIAGNOSIS — Z3A37 37 weeks gestation of pregnancy: Secondary | ICD-10-CM | POA: Diagnosis not present

## 2019-05-17 DIAGNOSIS — D649 Anemia, unspecified: Secondary | ICD-10-CM | POA: Diagnosis not present

## 2019-05-17 DIAGNOSIS — R519 Headache, unspecified: Secondary | ICD-10-CM | POA: Diagnosis present

## 2019-05-17 DIAGNOSIS — O9902 Anemia complicating childbirth: Secondary | ICD-10-CM | POA: Diagnosis not present

## 2019-05-17 DIAGNOSIS — O99214 Obesity complicating childbirth: Secondary | ICD-10-CM | POA: Diagnosis not present

## 2019-05-17 DIAGNOSIS — J45909 Unspecified asthma, uncomplicated: Secondary | ICD-10-CM | POA: Diagnosis present

## 2019-05-17 DIAGNOSIS — E669 Obesity, unspecified: Secondary | ICD-10-CM | POA: Diagnosis not present

## 2019-05-17 DIAGNOSIS — O133 Gestational [pregnancy-induced] hypertension without significant proteinuria, third trimester: Secondary | ICD-10-CM | POA: Diagnosis not present

## 2019-05-17 DIAGNOSIS — Z3A36 36 weeks gestation of pregnancy: Secondary | ICD-10-CM | POA: Diagnosis not present

## 2019-05-17 DIAGNOSIS — O26893 Other specified pregnancy related conditions, third trimester: Secondary | ICD-10-CM | POA: Diagnosis present

## 2019-05-17 DIAGNOSIS — Z20828 Contact with and (suspected) exposure to other viral communicable diseases: Secondary | ICD-10-CM | POA: Diagnosis present

## 2019-05-17 DIAGNOSIS — O169 Unspecified maternal hypertension, unspecified trimester: Secondary | ICD-10-CM | POA: Diagnosis present

## 2019-05-17 LAB — PROTEIN / CREATININE RATIO, URINE
Creatinine, Urine: 144.11 mg/dL
Protein Creatinine Ratio: 0.06 mg/mg{Cre} (ref 0.00–0.15)
Total Protein, Urine: 9 mg/dL

## 2019-05-17 MED ORDER — BUTALBITAL-APAP-CAFFEINE 50-325-40 MG PO TABS
2.0000 | ORAL_TABLET | ORAL | Status: AC
  Administered 2019-05-17: 2 via ORAL
  Filled 2019-05-17: qty 2

## 2019-05-17 NOTE — MAU Note (Signed)
PT SAYS SHE HAS H/A  TODAY .  AT OFFICE - HER BP WAS 150/100 AT APPOINTMENT - TOLD TO GO HOME AND REST -  IF H/A  COMES  BACK - COME TO HOSPITAL.  NO VISUAL PROBLEMS . HAS PAIN IN UPPER BACK - STARTED YESTERDAY

## 2019-05-18 ENCOUNTER — Observation Stay (HOSPITAL_COMMUNITY): Payer: BC Managed Care – PPO

## 2019-05-18 ENCOUNTER — Other Ambulatory Visit: Payer: Self-pay | Admitting: Advanced Practice Midwife

## 2019-05-18 ENCOUNTER — Encounter (HOSPITAL_COMMUNITY): Payer: Self-pay | Admitting: Student

## 2019-05-18 DIAGNOSIS — Z3A36 36 weeks gestation of pregnancy: Secondary | ICD-10-CM | POA: Diagnosis not present

## 2019-05-18 DIAGNOSIS — O133 Gestational [pregnancy-induced] hypertension without significant proteinuria, third trimester: Secondary | ICD-10-CM

## 2019-05-18 DIAGNOSIS — R519 Headache, unspecified: Secondary | ICD-10-CM | POA: Diagnosis present

## 2019-05-18 DIAGNOSIS — O169 Unspecified maternal hypertension, unspecified trimester: Secondary | ICD-10-CM | POA: Diagnosis present

## 2019-05-18 DIAGNOSIS — O26893 Other specified pregnancy related conditions, third trimester: Secondary | ICD-10-CM | POA: Diagnosis present

## 2019-05-18 LAB — SARS CORONAVIRUS 2 BY RT PCR (HOSPITAL ORDER, PERFORMED IN ~~LOC~~ HOSPITAL LAB): SARS Coronavirus 2: NEGATIVE

## 2019-05-18 IMAGING — US US MFM OB DETAIL+14 WK
1 series · 13 of 28 positions shown · non-contrast
Comparison: none

[Series 1: us mfm ob detail+14 wk · 50 acquisitions, 13 frames shown]
[im 2/50]
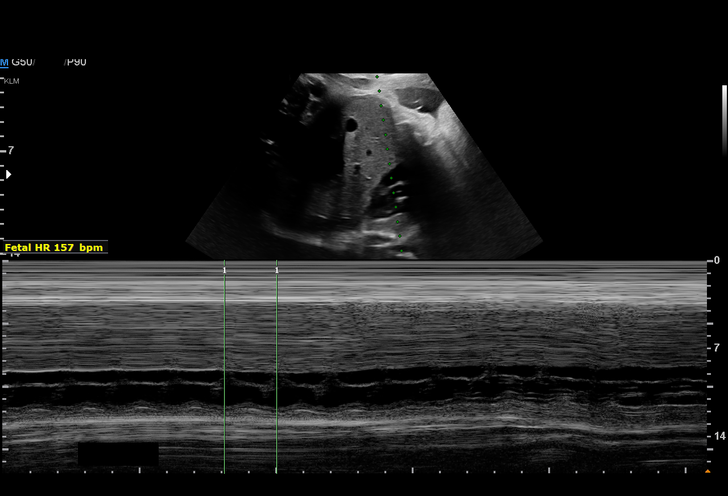
[im 6/50]
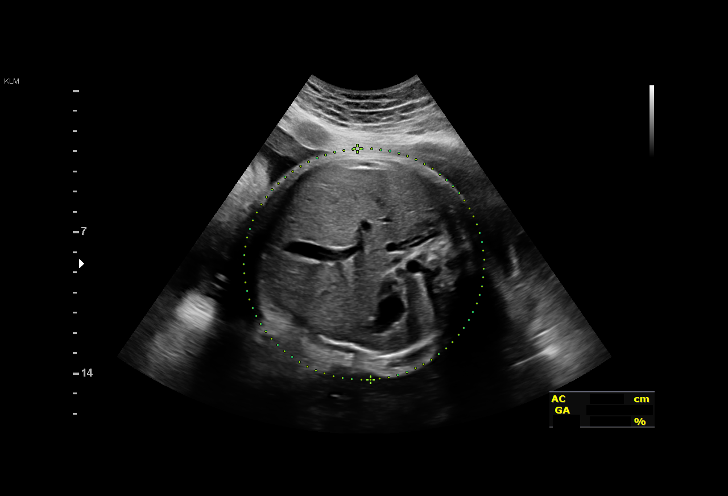
[im 10/50]
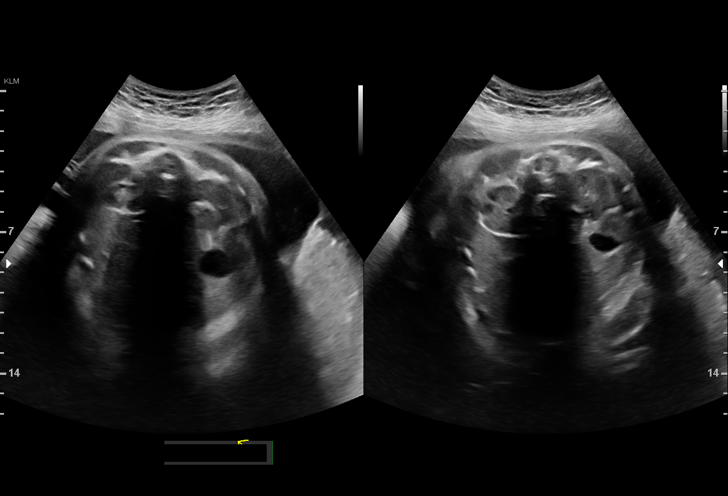
[im 13/50]
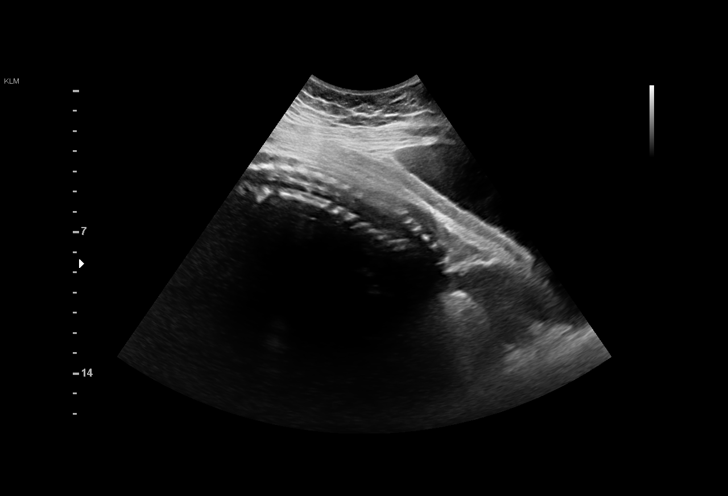
[im 17/50]
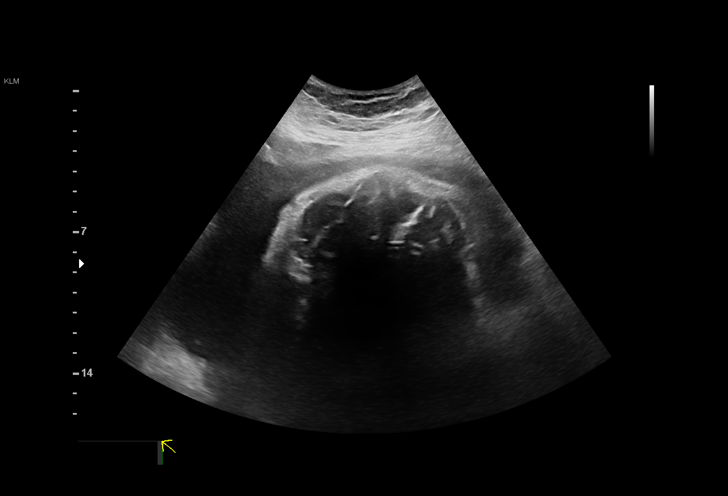
[im 20/50]
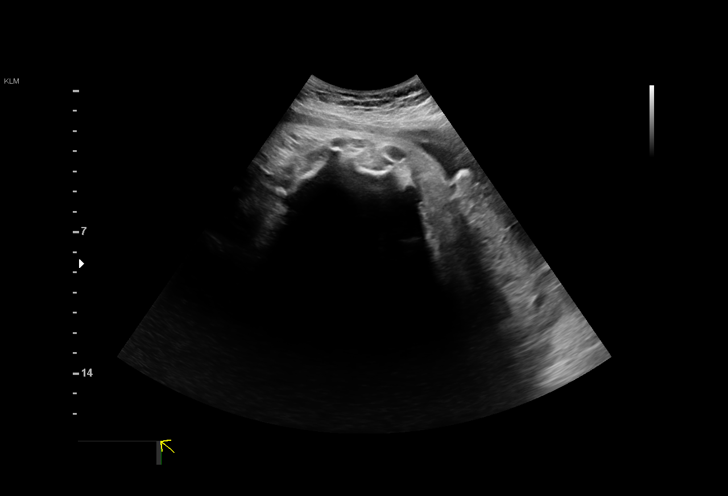
[im 26/50]
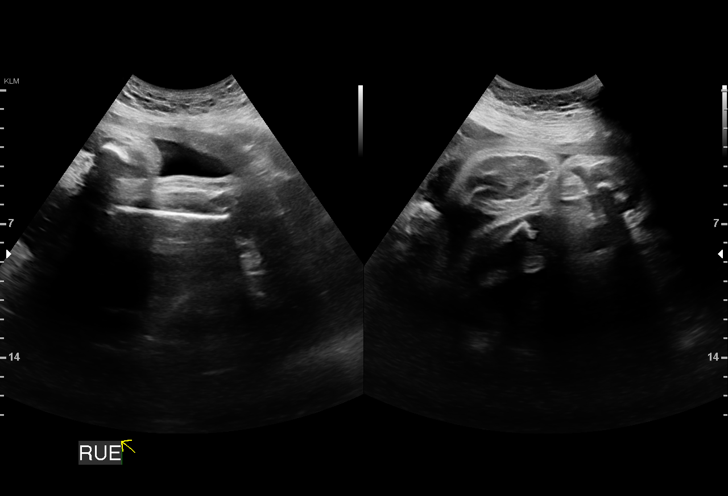
[im 30/50]
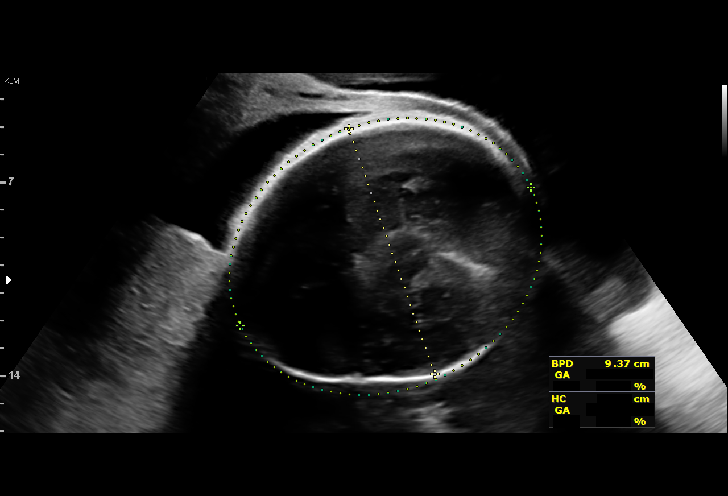
[im 33/50]
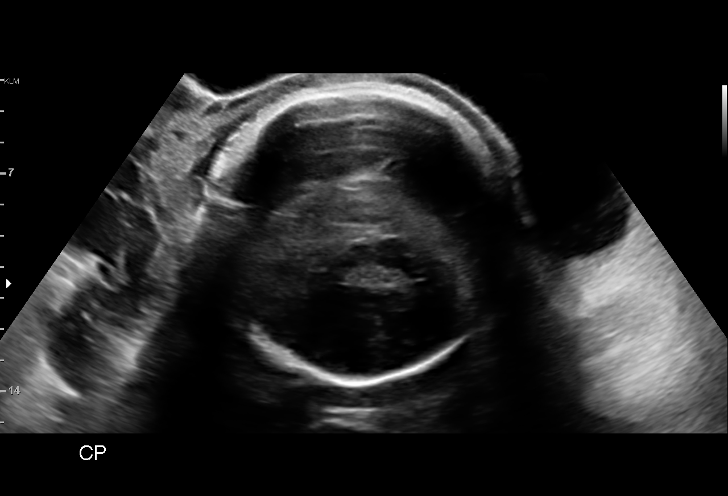
[im 37/50]
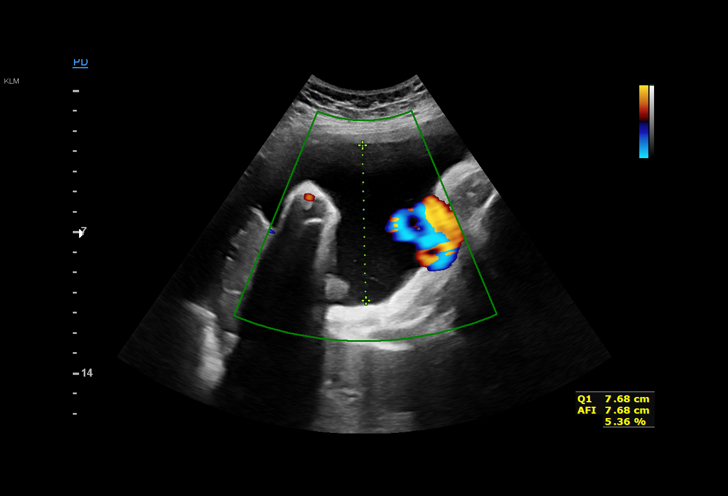
[im 40/50]
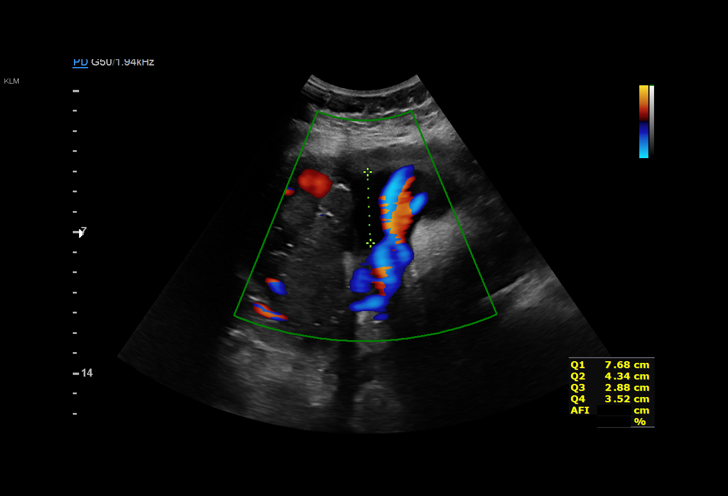
[im 44/50]
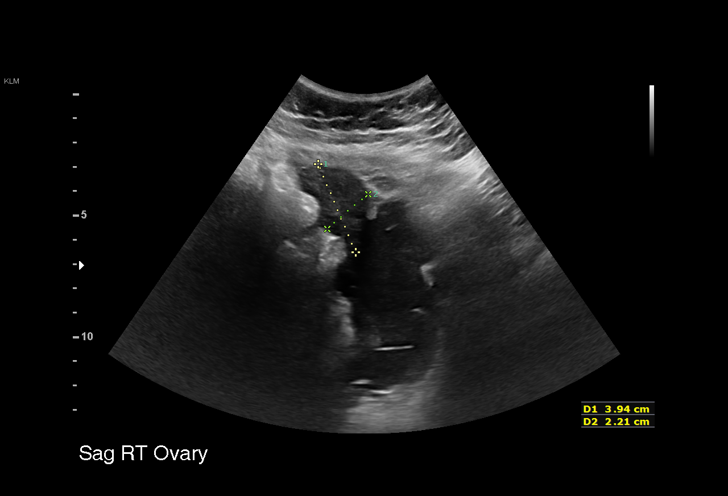
[im 48/50]
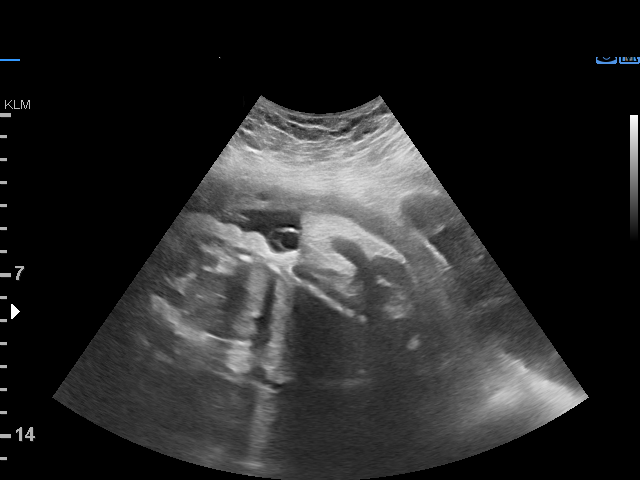

[13 of 28 positions shown; findings below may reference images not displayed]

Obstetrics &
                                                             Gynecology
                                                             [UB] POOIA
                                                             POOIA.
 Attending:        POOIA        Secondary Phy.:    [facility]
                                                             Care
 Referred By:      POOIA          [facility]

 ----------------------------------------------------------------------

 ----------------------------------------------------------------------
Indications

  Hypertension - Gestational                     [UB]
  36 weeks gestation of pregnancy
 ----------------------------------------------------------------------
Fetal Evaluation

 Num Of Fetuses:          1
 Fetal Heart Rate(bpm):   157
 Cardiac Activity:        Observed
 Presentation:            Cephalic
 Placenta:                Posterior
 P. Cord Insertion:       Not well visualized

 Amniotic Fluid
 AFI FV:      Within normal limits

 AFI Sum(cm)     %Tile       Largest Pocket(cm)
 18.42           70

 RUQ(cm)       RLQ(cm)       LUQ(cm)        LLQ(cm)

Biophysical Evaluation
 Amniotic F.V:   Within normal limits       F. Tone:         Observed
 F. Movement:    Observed                   Score:           [DATE]
 F. Breathing:   Observed
Biometry

 BPD:      92.7  mm     G. Age:  37w 5d         85  %    CI:        80.12   %    70 - 86
                                                         FL/HC:       21.8  %    20.8 -
 HC:      327.2  mm     G. Age:  37w 1d         32  %    HC/AC:       0.91       0.92 -
 AC:      360.3  mm     G. Age:  40w 0d       > 99  %    FL/BPD:      76.8  %    71 - 87
 FL:       71.2  mm     G. Age:  36w 3d         42  %    FL/AC:       19.8  %    20 - 24

 Est. FW:    [UB]   gm   7 lb 13 oz      94  %
OB History

 Gravidity:    5         Term:   2         SAB:   1
 TOP:          1        Living:  2
Gestational Age

 Clinical EDD:  36w 5d                                        EDD:   [DATE]
 U/S Today:     37w 6d                                        EDD:   [DATE]
 Best:          36w 5d     Det. By:  Clinical EDD             EDD:   [DATE]
Anatomy

 Cranium:               Appears normal         Aortic Arch:            Not well visualized
 Cavum:                 Not well visualized    Ductal Arch:            Not well visualized
 Ventricles:            Not well visualized    Diaphragm:              Appears normal
 Choroid Plexus:        Appears normal         Stomach:                Appears normal, left
                                                                       sided
 Cerebellum:            Appears normal         Abdomen:                Appears normal
 Posterior Fossa:       Appears normal         Abdominal Wall:         Not well visualized
 Nuchal Fold:           Not applicable (>20    Cord Vessels:           Appears normal (3
                        wks GA)                                        vessel cord)
 Face:                  Not well visualized    Kidneys:                Appear normal
 Lips:                  Not well visualized    Bladder:                Appears normal
 Thoracic:              Appears normal         Spine:                  Appears normal
 Heart:                 Not well visualized    Upper Extremities:      Visualized
 RVOT:                  Appears normal         Lower Extremities:      Visualized
 LVOT:                  Appears normal

 Other:  Technically difficult due to advanced gestational age.
Cervix Uterus Adnexa

 Cervix
 Not visualized (advanced GA >[UB])

 Uterus
 No abnormality visualized.

 Left Ovary
 Not visualized. No adnexal mass visualized.

 Right Ovary
 Within normal limits.
 Cul De Sac
 No free fluid seen.

 Adnexa
 No abnormality visualized.
Impression

 Patient is admitted with diagnosis of gestational hypertension.
 On ultrasound, the estimated fetal weight is at the 94th
 percentile. Cephalic presentation. Amniotic fluid is normal
 and good fetal activity is seen. Fetal anatomical survey is
 very limited by advanced gestational age. Antenatal testing is
 reassuring. BPP [DATE].
                 POOIA

## 2019-05-18 MED ORDER — PRENATAL MULTIVITAMIN CH
1.0000 | ORAL_TABLET | Freq: Every day | ORAL | Status: DC
  Administered 2019-05-19: 10:00:00 1 via ORAL
  Filled 2019-05-18 (×2): qty 1

## 2019-05-18 MED ORDER — FERROUS SULFATE 325 (65 FE) MG PO TABS
325.0000 mg | ORAL_TABLET | Freq: Two times a day (BID) | ORAL | Status: DC
  Administered 2019-05-18 – 2019-05-19 (×2): 325 mg via ORAL
  Filled 2019-05-18 (×2): qty 1

## 2019-05-18 MED ORDER — ZOLPIDEM TARTRATE 5 MG PO TABS: 5.0000 mg | ORAL_TABLET | Freq: Every evening | ORAL | Status: DC | PRN

## 2019-05-18 MED ORDER — DOCUSATE SODIUM 100 MG PO CAPS
100.0000 mg | ORAL_CAPSULE | Freq: Two times a day (BID) | ORAL | Status: DC
  Filled 2019-05-18 (×4): qty 1

## 2019-05-18 MED ORDER — DEXTROSE IN LACTATED RINGERS 5 % IV SOLN
Freq: Once | INTRAVENOUS | Status: AC
  Administered 2019-05-18: 01:00:00 via INTRAVENOUS

## 2019-05-18 MED ORDER — CALCIUM CARBONATE ANTACID 500 MG PO CHEW
2.0000 | CHEWABLE_TABLET | ORAL | Status: DC | PRN
  Administered 2019-05-18: 01:00:00 400 mg via ORAL
  Filled 2019-05-18: qty 2

## 2019-05-18 MED ORDER — MORPHINE SULFATE (PF) 4 MG/ML IV SOLN: 1.0000 mg | Freq: Four times a day (QID) | INTRAVENOUS | Status: DC | PRN

## 2019-05-18 MED ORDER — LACTATED RINGERS IV SOLN
INTRAVENOUS | Status: DC
  Administered 2019-05-18 – 2019-05-19 (×4): via INTRAVENOUS

## 2019-05-18 MED ORDER — HYDROXYZINE HCL 50 MG PO TABS
50.0000 mg | ORAL_TABLET | Freq: Three times a day (TID) | ORAL | Status: DC | PRN
  Administered 2019-05-18: 50 mg via ORAL
  Filled 2019-05-18 (×2): qty 1

## 2019-05-18 MED ORDER — FAMOTIDINE IN NACL 20-0.9 MG/50ML-% IV SOLN
20.0000 mg | Freq: Two times a day (BID) | INTRAVENOUS | Status: DC
  Administered 2019-05-18 (×3): 20 mg via INTRAVENOUS
  Filled 2019-05-18 (×3): qty 50

## 2019-05-18 MED ORDER — ACETAMINOPHEN 325 MG PO TABS
650.0000 mg | ORAL_TABLET | ORAL | Status: DC | PRN
  Administered 2019-05-18 – 2019-05-19 (×4): 650 mg via ORAL
  Filled 2019-05-18 (×4): qty 2

## 2019-05-18 NOTE — Plan of Care (Signed)
  Problem: Clinical Measurements: Goal: Ability to maintain clinical measurements within normal limits will improve Outcome: Progressing  BP currently WNL; monitoring

## 2019-05-18 NOTE — Progress Notes (Signed)
Pt asking, "Why am I still here?" Pt says she has not been informed regarding her plan of care and is asking to see CNM this evening for clarification. Burman Foster, CNM, on call this evening is currently in a delivery and will call back afterwards. Awaiting call back.

## 2019-05-18 NOTE — MAU Note (Addendum)
  Anna Burns, 28 y.o., [id number] @ [redacted]w[redacted]d, presents to MAU w/ c/o HA since 05/15/19. HA not relieved w/ rest and Tylenol. Reports active FM, denies epigastric pain, visual changes, vaginal bleeding and LOF. Pt was seen in office today, BP 150/100 and c/o HA. Pt was offered OBS status in hospital and declined. Pt was told to report to MAU if HA persisted. Pt states that she "just feels bad and dizzy". CBC and LFT's normal on 10/28.   O: BP 109/71   Pulse 93   Temp 98.2 F (36.8 C)   Resp 20   Ht 5' 3.5" (1.613 m)   Wt 93 kg   BMI 35.76 kg/m  Labs PCR=0.06  Past medical history:  Past Medical History:  Diagnosis Date  . Anemia   . Asthma   . Fibroid   . Genital herpes simplex 02/27/2018  . Gestational HTN 04/22/2018  . Gestational hypertension 04/22/2018  . H/O unilateral salpingectomy 04/04/2017  . Ovarian tumor (benign), left   . Pregnancy induced hypertension   . Pregnant 03/27/2017  . SAB (spontaneous abortion) 04/04/2017    Past surgical history:  Past Surgical History:  Procedure Laterality Date  . ELBOW SURGERY    . LEFT OOPHORECTOMY  2013   Family History:  Family History  Problem Relation Age of Onset  . Diabetes Father   . Aneurysm Mother   . Diabetes Mother   . Stroke Mother   . Diabetes Paternal Grandmother     Social History:  reports that she has never smoked. She has never used smokeless tobacco. She reports that she does not drink alcohol or use drugs.  Physical exam: Const: AAO x3 Cardiac: RRR Resp: even, unlabored GI/GU: abd soft, gravid, non-distended Neuro: reflexes +2, no clonus Extremities: no edema, no calf pain, cords, or tenderness   A: 28 y.o. [id number] [redacted]w[redacted]d Headache   P: Admit to OB Specialty for 23 hr OBS IV hydration Administer meds as ordered for HA Call for SBP>160, DBP >110, or if HA not resolved w/ meds   Plan reviewed with Dr. Barry Dienes MSN, CNM 05/18/2019 12:34 AM

## 2019-05-18 NOTE — Progress Notes (Signed)
Pt withcomplaints of headache,  She occ gets relief with vistaril.  No leakage of fluid or VB.  Good FM  BP 116/73   Pulse 96   Temp 98.3 F (36.8 C) (Oral)   Resp 18   Ht 5' 3.5" (1.613 m)   Wt 93 kg   LMP 09/03/2018   SpO2 99%   BMI 35.76 kg/m   FHTS cat1  Toco none  Pt in NAD CV RRR Lungs CTAB abd  Gravid soft and NT GU no vb EXt no calf tenderness Results for orders placed or performed during the hospital encounter of 05/17/19 (from the past 72 hour(s))  Protein / creatinine ratio, urine     Status: None   Collection Time: 05/17/19 10:11 PM  Result Value Ref Range   Creatinine, Urine 144.11 mg/dL   Total Protein, Urine 9 mg/dL    Comment: NO NORMAL RANGE ESTABLISHED FOR THIS TEST   Protein Creatinine Ratio 0.06 0.00 - 0.15 mg/mg[Cre]    Comment: Performed at [facility], [place] [address] [place], [place] [address]  SARS Coronavirus 2 by RT PCR (hospital order, performed in [facility] hospital lab) Nasopharyngeal Nasopharyngeal Swab     Status: None   Collection Time: 05/18/19 12:55 AM   Specimen: Nasopharyngeal Swab  Result Value Ref Range   SARS Coronavirus 2 NEGATIVE NEGATIVE    Comment: (NOTE) If result is NEGATIVE SARS-CoV-2 target nucleic acids are NOT DETECTED. The SARS-CoV-2 RNA is generally detectable in upper and lower  respiratory specimens during the acute phase of infection. The lowest  concentration of SARS-CoV-2 viral copies this assay can detect is 250  copies / mL. A negative result does not preclude SARS-CoV-2 infection  and should not be used as the sole basis for treatment or other  patient management decisions.  A negative result may occur with  improper specimen collection / handling, submission of specimen other  than nasopharyngeal swab, presence of viral mutation(s) within the  areas targeted by this assay, and inadequate number of viral copies  (<250 copies / mL). A negative result must be combined with clinical  observations,  patient history, and epidemiological information. If result is POSITIVE SARS-CoV-2 target nucleic acids are DETECTED. The SARS-CoV-2 RNA is generally detectable in upper and lower  respiratory specimens dur ing the acute phase of infection.  Positive  results are indicative of active infection with SARS-CoV-2.  Clinical  correlation with patient history and other diagnostic information is  necessary to determine patient infection status.  Positive results do  not rule out bacterial infection or co-infection with other viruses. If result is PRESUMPTIVE POSTIVE SARS-CoV-2 nucleic acids MAY BE PRESENT.   A presumptive positive result was obtained on the submitted specimen  and confirmed on repeat testing.  While 2019 novel coronavirus  (SARS-CoV-2) nucleic acids may be present in the submitted sample  additional confirmatory testing may be necessary for epidemiological  and / or clinical management purposes  to differentiate between  SARS-CoV-2 and other Sarbecovirus currently known to infect humans.  If clinically indicated additional testing with an alternate test  methodology [phone]) is advised. The SARS-CoV-2 RNA is generally  detectable in upper and lower respiratory sp ecimens during the acute  phase of infection. The expected result is Negative. Fact Sheet for Patients:  StrictlyIdeas.no Fact Sheet for Healthcare Providers: BankingDealers.co.za This test is not yet approved or cleared by the [place] FDA and has been authorized for detection and/or diagnosis of SARS-CoV-2 by FDA  under an Emergency Use Authorization (EUA).  This EUA will remain in effect (meaning this test can be used) for the duration of the COVID-19 declaration under Section 564(b)(1) of the Act, 21 U.S.C. section 360bbb-3(b)(1), unless the authorization is terminated or revoked sooner. Performed at [facility], [place] [address] [place],  [place] [address]     Assessment and Plan [redacted]w[redacted]d  Gestational HTN Blood pressures are WNL will do EFW and MFM consult. BPs have been elevated in the office and I think we should continue with the induction Pt will need type and screen she is anemic on iron Pt has also had some chest heaviness.   Pulse ox and exam WNL

## 2019-05-18 NOTE — H&P (Signed)
Anterpartum History and Physical  Anna Burns [id number] at 36 weeks 5 days presented to MAU w/ c/o HA since 05/15/19. HA not relieved w/ rest and Tylenol. Reports active FM, denies contractions, No epigastric pain, visual changes, vaginal bleeding and LOF. Pt was seen in office today, BP 150/100 and c/o HA. Pt was offered OBS status in hospital and declined. Pt was told to report to MAU if HA persisted. Pt states that she "just feels bad and dizzy". CBC and LFT's normal on 10/28.  Patient has been taking home blood pressures and notes that they are elevated.  She has a h/o gestational hypertension with last pregnancy and was delivered at 37 weeks.   Pregnancy complicated by Headaches Gestational hypertension Anemia  PMHX  Past Medical History:  Diagnosis Date  . Anemia   . Asthma   . Fibroid   . Genital herpes simplex 02/27/2018  . Gestational HTN 04/22/2018  . Gestational hypertension 04/22/2018  . H/O unilateral salpingectomy 04/04/2017  . Ovarian tumor (benign), left   . Pregnancy induced hypertension   . Pregnant 03/27/2017  . SAB (spontaneous abortion) 04/04/2017    PSURG HX  Past Surgical History:  Procedure Laterality Date  . ELBOW SURGERY    . LEFT OOPHORECTOMY  2013    FAM HX  Family History  Problem Relation Age of Onset  . Diabetes Father   . Aneurysm Mother   . Diabetes Mother   . Stroke Mother   . Diabetes Paternal Grandmother     [place] HX  Social History   Socioeconomic History  . Marital status: Married    Spouse name: Not on file  . Number of children: Not on file  . Years of education: Not on file  . Highest education level: Not on file  Occupational History  . Not on file  Social Needs  . Financial resource strain: Not on file  . Food insecurity    Worry: Not on file    Inability: Not on file  . Transportation needs    Medical: Not on file    Non-medical: Not on file  Tobacco Use  . Smoking status: Never Smoker  . Smokeless tobacco: Never  Used  Substance and Sexual Activity  . Alcohol use: No  . Drug use: No  . Sexual activity: Yes    Birth control/protection: None  Lifestyle  . Physical activity    Days per week: Not on file    Minutes per session: Not on file  . Stress: Not on file  Relationships  . Social Herbalist on phone: Not on file    Gets together: Not on file    Attends religious service: Not on file    Active member of club or organization: Not on file    Attends meetings of clubs or organizations: Not on file    Relationship status: Not on file  . Intimate partner violence    Fear of current or ex partner: Not on file    Emotionally abused: Not on file    Physically abused: Not on file    Forced sexual activity: Not on file  Other Topics Concern  . Not on file  Social History Narrative  . Not on file    ROS  Review of Systems - As per HPI   LABS CBC    Component Value Date/Time   WBC 7.7 05/12/2019 1809   RBC 3.88 05/12/2019 1809   HGB 8.7 (L) 05/12/2019 1809  HCT 27.9 (L) 05/12/2019 1809   PLT 225 05/12/2019 1809   MCV 71.9 (L) 05/12/2019 1809   MCH 22.4 (L) 05/12/2019 1809   MCHC 31.2 05/12/2019 1809   RDW 16.6 (H) 05/12/2019 1809   LYMPHSABS 1.7 05/12/2019 1809   MONOABS 0.5 05/12/2019 1809   EOSABS 0.1 05/12/2019 1809   BASOSABS 0.0 05/12/2019 1809     Gen WDWN  IN NAD Cardiac: RRR Resp: even, unlabored GI: abd soft, gravid, non-distended Neuro: reflexes +2, no clonus Extremities: no edema, no calf pain, cords, or tenderness  Cervical exam deferred  Assessment: SIUP at 36 weeks 5 days with headache history of mild range hypertension in office and at home.  BPs normal presently.  Normal preeclampsia labs. PCR 0.06  Plan: Admit to antepartum for 23 hour observation IV hydration Administer meds as ordered for HA Call for SBP>160, DBP >110, or if HA not resolved w/ meds Patient to be induced in 2 days at 37 weeks   [place], [place]

## 2019-05-18 NOTE — Progress Notes (Addendum)
Pt requests for ultrasound to be done at the bedside. Ultrasound tech made aware and is en route to department.

## 2019-05-18 NOTE — Progress Notes (Signed)
Pt stating that she is feeling some mild chest pressure, rating the pain a 2 out of 10. VSS. Pt denies SOB. Lungs auscultated and were clear. Dr. Charlesetta Garibaldi made aware. MD said she will be in the department in about an hour to see her.

## 2019-05-19 ENCOUNTER — Other Ambulatory Visit: Payer: Self-pay | Admitting: Advanced Practice Midwife

## 2019-05-19 LAB — COMPREHENSIVE METABOLIC PANEL
ALT: 10 U/L (ref 0–44)
AST: 14 U/L — ABNORMAL LOW (ref 15–41)
Albumin: 2.4 g/dL — ABNORMAL LOW (ref 3.5–5.0)
Alkaline Phosphatase: 83 U/L (ref 38–126)
Anion gap: 8 (ref 5–15)
BUN: <5 mg/dL — ABNORMAL LOW (ref 6–20)
CO2: 18 mmol/L — ABNORMAL LOW (ref 22–32)
Calcium: 8 mg/dL — ABNORMAL LOW (ref 8.9–10.3)
Chloride: 112 mmol/L — ABNORMAL HIGH (ref 98–111)
Creatinine, Ser: 0.37 mg/dL — ABNORMAL LOW (ref 0.44–1.00)
GFR calc Af Amer: >60 mL/min (ref >60)
GFR calc non Af Amer: >60 mL/min (ref >60)
Glucose, Bld: 84 mg/dL (ref 70–99)
Potassium: 3.5 mmol/L (ref 3.5–5.1)
Sodium: 138 mmol/L (ref 135–145)
Total Bilirubin: 0.3 mg/dL (ref 0.3–1.2)
Total Protein: 5.5 g/dL — ABNORMAL LOW (ref 6.5–8.1)

## 2019-05-19 LAB — CBC WITH DIFFERENTIAL/PLATELET
Abs Immature Granulocytes: 0.04 10*3/uL (ref 0.00–0.07)
Basophils Absolute: 0 10*3/uL (ref 0.0–0.1)
Basophils Relative: 0 %
Eosinophils Absolute: 0.1 10*3/uL (ref 0.0–0.5)
Eosinophils Relative: 2 %
HCT: 26.3 % — ABNORMAL LOW (ref 36.0–46.0)
Hemoglobin: 8 g/dL — ABNORMAL LOW (ref 12.0–15.0)
Immature Granulocytes: 1 %
Lymphocytes Relative: 35 %
Lymphs Abs: 2.1 10*3/uL (ref 0.7–4.0)
MCH: 22.1 pg — ABNORMAL LOW (ref 26.0–34.0)
MCHC: 30.4 g/dL (ref 30.0–36.0)
MCV: 72.7 fL — ABNORMAL LOW (ref 80.0–100.0)
Monocytes Absolute: 0.4 10*3/uL (ref 0.1–1.0)
Monocytes Relative: 6 %
Neutro Abs: 3.4 10*3/uL (ref 1.7–7.7)
Neutrophils Relative %: 56 %
Platelets: 215 10*3/uL (ref 150–400)
RBC: 3.62 MIL/uL — ABNORMAL LOW (ref 3.87–5.11)
RDW: 16.9 % — ABNORMAL HIGH (ref 11.5–15.5)
WBC: 6 10*3/uL (ref 4.0–10.5)
nRBC: 0 % (ref 0.0–0.2)

## 2019-05-19 LAB — PROTEIN / CREATININE RATIO, URINE
Creatinine, Urine: 125.82 mg/dL
Protein Creatinine Ratio: 0.07 mg/mg{Cre} (ref 0.00–0.15)
Total Protein, Urine: 9 mg/dL

## 2019-05-19 MED ORDER — FAMOTIDINE 20 MG PO TABS
20.0000 mg | ORAL_TABLET | Freq: Two times a day (BID) | ORAL | Status: DC
  Administered 2019-05-19 (×2): 20 mg via ORAL
  Filled 2019-05-19 (×2): qty 1

## 2019-05-19 NOTE — Progress Notes (Addendum)
S: Denies HA, chest heaviness, and SOB, reports feeling tired and slept all day. Pt has several questions RE: plan of care. Discussed plan for IOL per Dr. Alwyn Pea on 11/5 and offered discharge now w/ return on 11/5 or pt may remain in-pt until IOL day and be tx to L&D. Pt elects to remain in-pt. All other questions answered.   O: Blood pressure 116/72, pulse 88, temperature 97.8 F (36.6 C), temperature source Oral, resp. rate 18, height 5' 3.5" (1.613 m), weight 93 kg, last menstrual period 09/03/2018, SpO2 100 %, unknown if currently breastfeeding.  No new labs  NST-reactive  U/S and MFM consult today. U/S impression Patient is admitted with diagnosis of gestational hypertension.  On ultrasound, the estimated fetal weight is at the 94th  percentile. Cephalic presentation. Amniotic fluid is normal  and good fetal activity is seen. Fetal anatomical survey is  very limited by advanced gestational age. Antenatal testing is  reassuring. BPP 8/8.  A/P:  89 y.[id number] [redacted]w[redacted]d     -normal exam  GHTN     -normotensive since admission     -Call for SBP>160, DBP >110, or if HA worsens. Fetal well-being     -NST Cat 1 GBS negative  Will remain in-pt until IOL @ 37wk 0days  Burman Foster, MSN, CNM 05/19/2019 6:46 AM

## 2019-05-20 ENCOUNTER — Encounter (HOSPITAL_COMMUNITY): Payer: Self-pay

## 2019-05-20 ENCOUNTER — Observation Stay (HOSPITAL_COMMUNITY): Payer: BC Managed Care – PPO | Admitting: Anesthesiology

## 2019-05-20 ENCOUNTER — Inpatient Hospital Stay (HOSPITAL_COMMUNITY): Payer: BC Managed Care – PPO

## 2019-05-20 ENCOUNTER — Inpatient Hospital Stay (HOSPITAL_COMMUNITY)
Admission: AD | Admit: 2019-05-20 | Payer: BC Managed Care – PPO | Source: Home / Self Care | Admitting: Obstetrics and Gynecology

## 2019-05-20 DIAGNOSIS — R519 Headache, unspecified: Secondary | ICD-10-CM | POA: Diagnosis present

## 2019-05-20 DIAGNOSIS — E669 Obesity, unspecified: Secondary | ICD-10-CM | POA: Diagnosis present

## 2019-05-20 DIAGNOSIS — Z3A37 37 weeks gestation of pregnancy: Secondary | ICD-10-CM | POA: Diagnosis not present

## 2019-05-20 DIAGNOSIS — Z20828 Contact with and (suspected) exposure to other viral communicable diseases: Secondary | ICD-10-CM | POA: Diagnosis present

## 2019-05-20 DIAGNOSIS — O99214 Obesity complicating childbirth: Secondary | ICD-10-CM | POA: Diagnosis present

## 2019-05-20 DIAGNOSIS — J45909 Unspecified asthma, uncomplicated: Secondary | ICD-10-CM | POA: Diagnosis present

## 2019-05-20 DIAGNOSIS — O9952 Diseases of the respiratory system complicating childbirth: Secondary | ICD-10-CM | POA: Diagnosis present

## 2019-05-20 DIAGNOSIS — O134 Gestational [pregnancy-induced] hypertension without significant proteinuria, complicating childbirth: Secondary | ICD-10-CM | POA: Diagnosis present

## 2019-05-20 DIAGNOSIS — O9081 Anemia of the puerperium: Secondary | ICD-10-CM | POA: Diagnosis not present

## 2019-05-20 LAB — CBC
HCT: 26.3 % — ABNORMAL LOW (ref 36.0–46.0)
HCT: 28.4 % — ABNORMAL LOW (ref 36.0–46.0)
Hemoglobin: 7.9 g/dL — ABNORMAL LOW (ref 12.0–15.0)
Hemoglobin: 8.6 g/dL — ABNORMAL LOW (ref 12.0–15.0)
MCH: 21.8 pg — ABNORMAL LOW (ref 26.0–34.0)
MCH: 22 pg — ABNORMAL LOW (ref 26.0–34.0)
MCHC: 30 g/dL (ref 30.0–36.0)
MCHC: 30.3 g/dL (ref 30.0–36.0)
MCV: 72.5 fL — ABNORMAL LOW (ref 80.0–100.0)
MCV: 72.6 fL — ABNORMAL LOW (ref 80.0–100.0)
Platelets: 219 10*3/uL (ref 150–400)
Platelets: 230 10*3/uL (ref 150–400)
RBC: 3.63 MIL/uL — ABNORMAL LOW (ref 3.87–5.11)
RBC: 3.91 MIL/uL (ref 3.87–5.11)
RDW: 17 % — ABNORMAL HIGH (ref 11.5–15.5)
RDW: 16.9 % — ABNORMAL HIGH (ref 11.5–15.5)
WBC: 7.6 10*3/uL (ref 4.0–10.5)
WBC: 8.2 10*3/uL (ref 4.0–10.5)
nRBC: 0 % (ref 0.0–0.2)
nRBC: 0 % (ref 0.0–0.2)

## 2019-05-20 LAB — RPR: RPR Ser Ql: NONREACTIVE

## 2019-05-20 LAB — TYPE AND SCREEN
ABO/RH(D): B POS
Antibody Screen: NEGATIVE

## 2019-05-20 LAB — ABO/RH: ABO/RH(D): B POS

## 2019-05-20 MED ORDER — CALCIUM CARBONATE ANTACID 500 MG PO CHEW
1.0000 | CHEWABLE_TABLET | Freq: Every day | ORAL | Status: DC | PRN
  Administered 2019-05-21: 200 mg via ORAL
  Filled 2019-05-20 (×2): qty 1

## 2019-05-20 MED ORDER — PHENYLEPHRINE 40 MCG/ML (10ML) SYRINGE FOR IV PUSH (FOR BLOOD PRESSURE SUPPORT)
80.0000 ug | PREFILLED_SYRINGE | INTRAVENOUS | Status: DC | PRN
  Filled 2019-05-20: qty 10

## 2019-05-20 MED ORDER — ACETAMINOPHEN 325 MG PO TABS: 650.0000 mg | ORAL_TABLET | ORAL | Status: DC | PRN

## 2019-05-20 MED ORDER — TRANEXAMIC ACID-NACL 1000-0.7 MG/100ML-% IV SOLN
INTRAVENOUS | Status: AC
  Filled 2019-05-20: qty 100

## 2019-05-20 MED ORDER — SODIUM CHLORIDE (PF) 0.9 % IJ SOLN
INTRAMUSCULAR | Status: DC | PRN
  Administered 2019-05-20: 12 mL/h via EPIDURAL

## 2019-05-20 MED ORDER — OXYTOCIN 40 UNITS IN NORMAL SALINE INFUSION - SIMPLE MED
2.5000 [IU]/h | INTRAVENOUS | Status: DC
  Administered 2019-05-20: 2.5 [IU]/h via INTRAVENOUS

## 2019-05-20 MED ORDER — SIMETHICONE 80 MG PO CHEW: 80.0000 mg | CHEWABLE_TABLET | ORAL | Status: DC | PRN

## 2019-05-20 MED ORDER — MISOPROSTOL 25 MCG QUARTER TABLET
25.0000 ug | ORAL_TABLET | ORAL | Status: DC | PRN
  Administered 2019-05-20: 05:00:00 25 ug via VAGINAL
  Filled 2019-05-20 (×2): qty 1

## 2019-05-20 MED ORDER — LIDOCAINE HCL (PF) 1 % IJ SOLN
INTRAMUSCULAR | Status: DC | PRN
  Administered 2019-05-20: 10 mL via EPIDURAL
  Administered 2019-05-20: 2 mL via EPIDURAL

## 2019-05-20 MED ORDER — BENZOCAINE-MENTHOL 20-0.5 % EX AERO
1.0000 "application " | INHALATION_SPRAY | CUTANEOUS | Status: DC | PRN
  Administered 2019-05-21: 1 via TOPICAL
  Filled 2019-05-20: qty 56

## 2019-05-20 MED ORDER — ONDANSETRON HCL 4 MG PO TABS: 4.0000 mg | ORAL_TABLET | ORAL | Status: DC | PRN

## 2019-05-20 MED ORDER — FENTANYL-BUPIVACAINE-NACL 0.5-0.125-0.9 MG/250ML-% EP SOLN
12.0000 mL/h | EPIDURAL | Status: DC | PRN
  Filled 2019-05-20: qty 250

## 2019-05-20 MED ORDER — DIPHENHYDRAMINE HCL 50 MG/ML IJ SOLN: 12.5000 mg | INTRAMUSCULAR | Status: DC | PRN

## 2019-05-20 MED ORDER — WITCH HAZEL-GLYCERIN EX PADS: 1.0000 "application " | MEDICATED_PAD | CUTANEOUS | Status: DC | PRN

## 2019-05-20 MED ORDER — EPHEDRINE 5 MG/ML INJ: 10.0000 mg | INTRAVENOUS | Status: DC | PRN

## 2019-05-20 MED ORDER — MISOPROSTOL 200 MCG PO TABS
1000.0000 ug | ORAL_TABLET | Freq: Once | ORAL | Status: DC | PRN
  Filled 2019-05-20: qty 5

## 2019-05-20 MED ORDER — TERBUTALINE SULFATE 1 MG/ML IJ SOLN: 0.2500 mg | Freq: Once | INTRAMUSCULAR | Status: DC | PRN

## 2019-05-20 MED ORDER — DIPHENHYDRAMINE HCL 25 MG PO CAPS: 25.0000 mg | ORAL_CAPSULE | Freq: Four times a day (QID) | ORAL | Status: DC | PRN

## 2019-05-20 MED ORDER — LIDOCAINE HCL (PF) 1 % IJ SOLN: 30.0000 mL | INTRAMUSCULAR | Status: DC | PRN

## 2019-05-20 MED ORDER — PRENATAL MULTIVITAMIN CH
1.0000 | ORAL_TABLET | Freq: Every day | ORAL | Status: DC
  Administered 2019-05-21 – 2019-05-22 (×2): 1 via ORAL
  Filled 2019-05-20 (×2): qty 1

## 2019-05-20 MED ORDER — SOD CITRATE-CITRIC ACID 500-334 MG/5ML PO SOLN: 30.0000 mL | ORAL | Status: DC | PRN

## 2019-05-20 MED ORDER — SENNOSIDES-DOCUSATE SODIUM 8.6-50 MG PO TABS
2.0000 | ORAL_TABLET | ORAL | Status: DC
  Administered 2019-05-21 (×2): 2 via ORAL
  Filled 2019-05-20 (×2): qty 2

## 2019-05-20 MED ORDER — OXYTOCIN BOLUS FROM INFUSION
500.0000 mL | Freq: Once | INTRAVENOUS | Status: AC
  Administered 2019-05-20: 500 mL via INTRAVENOUS

## 2019-05-20 MED ORDER — LACTATED RINGERS IV SOLN: 500.0000 mL | Freq: Once | INTRAVENOUS | Status: DC

## 2019-05-20 MED ORDER — ONDANSETRON HCL 4 MG/2ML IJ SOLN
4.0000 mg | Freq: Four times a day (QID) | INTRAMUSCULAR | Status: DC | PRN
  Administered 2019-05-20: 18:00:00 4 mg via INTRAVENOUS
  Filled 2019-05-20: qty 2

## 2019-05-20 MED ORDER — ONDANSETRON HCL 4 MG/2ML IJ SOLN: 4.0000 mg | INTRAMUSCULAR | Status: DC | PRN

## 2019-05-20 MED ORDER — PHENYLEPHRINE 40 MCG/ML (10ML) SYRINGE FOR IV PUSH (FOR BLOOD PRESSURE SUPPORT): 80.0000 ug | PREFILLED_SYRINGE | INTRAVENOUS | Status: DC | PRN

## 2019-05-20 MED ORDER — COCONUT OIL OIL: 1.0000 "application " | TOPICAL_OIL | Status: DC | PRN

## 2019-05-20 MED ORDER — OXYTOCIN 40 UNITS IN NORMAL SALINE INFUSION - SIMPLE MED
1.0000 m[IU]/min | INTRAVENOUS | Status: DC
  Administered 2019-05-20: 12:00:00 2 m[IU]/min via INTRAVENOUS

## 2019-05-20 MED ORDER — IBUPROFEN 600 MG PO TABS
600.0000 mg | ORAL_TABLET | Freq: Four times a day (QID) | ORAL | Status: DC
  Administered 2019-05-21 – 2019-05-22 (×7): 600 mg via ORAL
  Filled 2019-05-20 (×7): qty 1

## 2019-05-20 MED ORDER — LACTATED RINGERS IV SOLN
500.0000 mL | INTRAVENOUS | Status: DC | PRN
  Administered 2019-05-20: 04:00:00 500 mL via INTRAVENOUS

## 2019-05-20 MED ORDER — ACETAMINOPHEN 325 MG PO TABS
650.0000 mg | ORAL_TABLET | ORAL | Status: DC | PRN
  Administered 2019-05-20 (×3): 650 mg via ORAL
  Filled 2019-05-20 (×3): qty 2

## 2019-05-20 MED ORDER — DIBUCAINE (PERIANAL) 1 % EX OINT: 1.0000 "application " | TOPICAL_OINTMENT | CUTANEOUS | Status: DC | PRN

## 2019-05-20 MED ORDER — TETANUS-DIPHTH-ACELL PERTUSSIS 5-2.5-18.5 LF-MCG/0.5 IM SUSP: 0.5000 mL | Freq: Once | INTRAMUSCULAR | Status: DC

## 2019-05-20 MED ORDER — LACTATED RINGERS IV SOLN
INTRAVENOUS | Status: DC
  Administered 2019-05-20: 04:00:00 via INTRAVENOUS

## 2019-05-20 MED ORDER — FENTANYL CITRATE (PF) 100 MCG/2ML IJ SOLN: 50.0000 ug | INTRAMUSCULAR | Status: DC | PRN

## 2019-05-20 MED ORDER — ALBUTEROL SULFATE (2.5 MG/3ML) 0.083% IN NEBU: 3.0000 mL | INHALATION_SOLUTION | RESPIRATORY_TRACT | Status: DC | PRN

## 2019-05-20 MED ORDER — OXYTOCIN 40 UNITS IN NORMAL SALINE INFUSION - SIMPLE MED
1.0000 m[IU]/min | INTRAVENOUS | Status: DC
  Filled 2019-05-20: qty 1000

## 2019-05-20 NOTE — Progress Notes (Signed)
Saniyya Neri is a 28 y.o. [id number] at [redacted]w[redacted]d by LMP admitted for induction of labor due to gestational hypertension at term.  Subjective: Patient has headache, no blurry vision, no scotomata, feels better now that  She has epidural  Objective: BP 125/85   Pulse (!) 104   Temp 97.8 F (36.6 C) (Oral)   Resp 16   Ht 5' 3.5" (1.613 m)   Wt 93 kg   LMP 09/03/2018   SpO2 99%   BMI 35.76 kg/m  I/O last 3 completed shifts: In: 1266.9 [P.O.:30; I.V.:1186.9; IV Piggyback:50] Out: -  No intake/output data recorded.  FHT:  FHR: 145 bpm, variability: moderate,  accelerations:  Abscent,  decelerations:  Absent UC:   regular, every 2-3 minutes SVE:   Dilation: 4 Effacement (%): 60 Station: -1 Exam by:: Jomarie Gellis  Labs: Lab Results  Component Value Date   WBC 8.2 05/20/2019   HGB 8.6 (L) 05/20/2019   HCT 28.4 (L) 05/20/2019   MCV 72.6 (L) 05/20/2019   PLT 230 05/20/2019    Assessment / Plan: Induction of labor due to gestational hypertension,  progressing well on pitocin  Labor: Progressing on Pitocin, will continue to increase then AROM Preeclampsia:  persistent HA  BP's ok  Fetal Wellbeing:  Category I Pain Control:  Epidural I/D:  n/a Anticipated MOD:  NSVD  Braysen Cloward [name] 05/20/2019, 1:14 PM

## 2019-05-20 NOTE — Anesthesia Procedure Notes (Signed)
Epidural Patient location during procedure: OB Start time: 05/20/2019 12:08 PM End time: 05/20/2019 12:20 PM  Staffing Anesthesiologist: Pervis Hocking, DO Performed: anesthesiologist   Preanesthetic Checklist Completed: patient identified, pre-op evaluation, timeout performed, IV checked, risks and benefits discussed and monitors and equipment checked  Epidural Patient position: sitting Prep: site prepped and draped and DuraPrep Patient monitoring: continuous pulse ox, blood pressure, heart rate and cardiac monitor Approach: midline Location: L3-L4 Injection technique: LOR air  Needle:  Needle type: Tuohy  Needle gauge: 17 G Needle length: 9 cm Needle insertion depth: 6 cm Catheter type: closed end flexible Catheter size: 19 Gauge Catheter at skin depth: 11 cm Test dose: negative  Assessment Sensory level: T8 Events: blood not aspirated, injection not painful, no injection resistance, negative IV test and no paresthesia  Additional Notes Patient identified. Risks/Benefits/Options discussed with patient including but not limited to bleeding, infection, nerve damage, paralysis, failed block, incomplete pain control, headache, blood pressure changes, nausea, vomiting, reactions to medication both or allergic, itching and postpartum back pain. Confirmed with bedside nurse the patient's most recent platelet count. Confirmed with patient that they are not currently taking any anticoagulation, have any bleeding history or any family history of bleeding disorders. Patient expressed understanding and wished to proceed. All questions were answered. Sterile technique was used throughout the entire procedure. Please see nursing notes for vital signs. Test dose was given through epidural catheter and negative prior to continuing to dose epidural or start infusion. Warning signs of high block given to the patient including shortness of breath, tingling/numbness in hands, complete motor  block, or any concerning symptoms with instructions to call for help. Patient was given instructions on fall risk and not to get out of bed. All questions and concerns addressed with instructions to call with any issues or inadequate analgesia.  Reason for block:procedure for pain

## 2019-05-20 NOTE — Progress Notes (Signed)
Anna Burns is a 28 y.o. [id number] at [redacted]w[redacted]d by LMP admitted for induction of labor due to gestational hypertension.  Subjective:   Objective: BP 122/73   Pulse 97   Temp 97.7 F (36.5 C) (Axillary)   Resp 18   Ht 5' 3.5" (1.613 m)   Wt 93 kg   LMP 09/03/2018   SpO2 99%   BMI 35.76 kg/m  No intake/output data recorded. No intake/output data recorded.  FHT:  FHR: 135 bpm, variability: moderate,  accelerations:  Present,  decelerations:  Absent UC:   regular, every 3 minutes SVE:   Dilation: 5 Effacement (%): 60 Station: -1 Exam by:: lee AROM performed clear fluid IUPC placed  Labs: Lab Results  Component Value Date   WBC 8.2 05/20/2019   HGB 8.6 (L) 05/20/2019   HCT 28.4 (L) 05/20/2019   MCV 72.6 (L) 05/20/2019   PLT 230 05/20/2019    Assessment / Plan: Induction of labor due to gestational hypertension,  With active labor dystocia  Labor: delayed active labor IUPC placed Preeclampsia:  no signs or symptoms of toxicity and intake and ouput balanced Fetal Wellbeing:  Category I Pain Control:  Epidural I/D:  n/a Anticipated MOD:  NSVD  Anna Burns [name] 05/20/2019, 7:24 PM

## 2019-05-20 NOTE — Plan of Care (Signed)
completed

## 2019-05-20 NOTE — Anesthesia Preprocedure Evaluation (Signed)
Anesthesia Evaluation  Patient identified by MRN, date of birth, ID band Patient awake    Reviewed: Allergy & Precautions, H&P , NPO status , Patient's Chart, lab work & pertinent test results  History of Anesthesia Complications Negative for: history of anesthetic complications  Airway Mallampati: II  TM Distance: >3 FB Neck ROM: full    Dental no notable dental hx. (+) Teeth Intact   Pulmonary asthma ,    Pulmonary exam normal breath sounds clear to auscultation       Cardiovascular hypertension, Normal cardiovascular exam Rhythm:regular Rate:Normal  Gestational HTN- no meds   Neuro/Psych  Headaches, negative psych ROS   GI/Hepatic negative GI ROS, Neg liver ROS,   Endo/Other  Obesity BMI 36  Renal/GU negative Renal ROS   Uterine fibroid    Musculoskeletal negative musculoskeletal ROS (+)   Abdominal   Peds  Hematology  (+) anemia , Hb 8.6, plt 230   Anesthesia Other Findings   Reproductive/Obstetrics (+) Pregnancy                             Anesthesia Physical  Anesthesia Plan  ASA: III and emergent  Anesthesia Plan: Epidural   Post-op Pain Management:    Induction:   PONV Risk Score and Plan: 2  Airway Management Planned:   Additional Equipment: None  Intra-op Plan:   Post-operative Plan:   Informed Consent: I have reviewed the patients History and Physical, chart, labs and discussed the procedure including the risks, benefits and alternatives for the proposed anesthesia with the patient or authorized representative who has indicated his/her understanding and acceptance.       Plan Discussed with:   Anesthesia Plan Comments:         Anesthesia Quick Evaluation

## 2019-05-20 NOTE — Progress Notes (Addendum)
OB ADMISSION/ HISTORY & PHYSICAL:  Admission Date: 05/17/2019  9:31 PM  Admit Diagnosis: Gestational hypertension  Ms Carrye Majeski [id number] at 36 weeks 5 days presented to MAU w/ c/oHAsince 05/15/19. HAnot relieved w/ rest and Tylenol. Reportsactive FM, denies contractions, No epigastric pain, visual changes,vaginal bleeding and LOF. Pt was seen in office today, BP 150/100 and c/o HA. Pt was offered OBS status in hospital and declined. Pt was told to report to MAU if HA persisted. Pt states that she "just feels bad and dizzy". CBC and LFT's normal on 10/28. Patient has been taking home blood pressures and notes that they are elevated.  She has a h/o gestational hypertension with last pregnancy and was delivered at 37 weeks. [place] w/ last birth 2019.  Prenatal History: [id number]   EDC : 06/10/2019, by LMP and congruent w/ 7w 3d U/S Prenatal care at [facility] [place] Provider: Dr. Alwyn Pea Prenatal course complicated by: Headaches, Gest HTN, Anemia   Patient Active Problem List   Diagnosis Date Noted  . Headache in pregnancy, antepartum, third trimester 05/18/2019  . Hypertension affecting pregnancy 05/18/2019  . Gestational hypertension 04/22/2018  . Genital herpes simplex 02/27/2018  . Asthma 04/04/2017  . SAB (spontaneous abortion) 04/04/2017  . H/O unilateral salpingectomy 04/04/2017  . Pregnant 03/27/2017    Prenatal Labs: ABO, Rh: --/--/B POS, B POS Performed at [facility], [place] [address], [place], [place] [address]  [phone]) Antibody: NEG (11/04 2348) Rubella:  Immunie  RPR:   Non-reactive HBsAg:   Negative HIV:   Non-reactive GTT: Normal 3 hr  GBS:  Negative  GC/CHL: Negative Genetics: Low-risk female  OB History  Gravida Para Term Preterm AB Living  6 2 2  0 3 2  SAB TAB Ectopic Multiple Live Births  2 1   0 2    # Outcome Date GA Lbr Len/2nd Weight Sex Delivery Anes PTL Lv  6 Current           5 Term 04/23/18 [redacted]w[redacted]d  2865 g M Vag-Spont EPI  LIV   Complications: Other Excessive Bleeding  4 Term 01/17/12 [redacted]w[redacted]d   F Vag-Spont EPI  LIV  3 SAB           2 SAB           1 TAB             Obstetric Comments  Induced due to HTN, PP hemorrhage w/2019 delivery    Medical / Surgical History: Past medical history:  Past Medical History:  Diagnosis Date  . Anemia   . Asthma   . Fibroid   . Genital herpes simplex 02/27/2018  . Gestational HTN 04/22/2018  . Gestational hypertension 04/22/2018  . H/O unilateral salpingectomy 04/04/2017  . Ovarian tumor (benign), left   . Pregnancy induced hypertension   . Pregnant 03/27/2017  . SAB (spontaneous abortion) 04/04/2017    Past surgical history:  Past Surgical History:  Procedure Laterality Date  . ELBOW SURGERY    . LEFT OOPHORECTOMY  2013   Family History:  Family History  Problem Relation Age of Onset  . Diabetes Father   . Aneurysm Mother   . Diabetes Mother   . Stroke Mother   . Diabetes Paternal Grandmother     Social History:  reports that she has never smoked. She has never used smokeless tobacco. She reports that she does not drink alcohol or use drugs.  Allergies: Contrast media [iodinated diagnostic agents]   Current  Medications at time of admission:  Prior to Admission medications   Medication Sig Start Date End Date Taking? Authorizing Provider  calcium carbonate (TUMS - DOSED IN MG ELEMENTAL CALCIUM) 500 MG chewable tablet Chew 1 tablet by mouth 5 (five) times daily as needed for indigestion or heartburn.   Yes [provider]  ferrous sulfate 325 (65 FE) MG tablet Take 1 tablet (325 mg total) by mouth daily with breakfast. 04/25/18  Yes Marikay Alar, CNM  albuterol (VENTOLIN HFA) 108 (90 Base) MCG/ACT inhaler Inhale into the lungs every 4 (four) hours as needed for wheezing or shortness of breath.    [provider]  ferumoxytol (FERAHEME) 510 MG/17ML SOLN injection Inject 17 mLs (510 mg total) into the vein once for 1 dose. 05/11/19 05/11/19   Starr Lake, CNM    Review of Systems: Constitutional: Negative.   HENT: Negative.   Eyes: Negative.   Respiratory: Negative.   Cardiovascular: Negative.   Gastrointestinal:   Genitourinary: Negative for bloody show and LOF   Musculoskeletal: Negative.   Skin: Negative.   Neurological: Negative.   Endo/Heme/Allergies: Negative.   Psychiatric/Behavioral: Negative.    Physical Exam: VS: Blood pressure 130/75, pulse 89, temperature 97.8 F (36.6 C), temperature source Oral, resp. rate 16, height 5' 3.5" (1.613 m), weight 93 kg, last menstrual period 09/03/2018, SpO2 100 %, unknown if currently breastfeeding. AAO x3, no signs of distress Cardiovascular: RRR Respiratory: Lung fields clear per ausculation GU/GI: Abdomen gravid, non-tender, non-distended, active FM, vertex and 6# 10oz per Leopold's Extremities: No edema, negative for pain, tenderness, and cords  Cervical exam:Dilation: 1 Effacement (%): Thick Exam by:: V.Ruhee Enck,CNM  Cytotec 25 mcg PV placed  FHR: baseline rate 125 / variability moderate / accelerations present / absent decelerations TOCO: irreg ctx CBC    Component Value Date/Time   WBC 7.6 05/20/2019 0516   RBC 3.63 (L) 05/20/2019 0516   HGB 7.9 (L) 05/20/2019 0516   HCT 26.3 (L) 05/20/2019 0516   PLT 219 05/20/2019 0516   MCV 72.5 (L) 05/20/2019 0516   MCH 21.8 (L) 05/20/2019 0516   MCHC 30.0 05/20/2019 0516   RDW 17.0 (H) 05/20/2019 0516   LYMPHSABS 2.1 05/19/2019 0808   MONOABS 0.4 05/19/2019 0808   EOSABS 0.1 05/19/2019 0808   BASOSABS 0.0 05/19/2019 0808   CMP     Component Value Date/Time   NA 138 05/19/2019 0808   K 3.5 05/19/2019 0808   CL 112 (H) 05/19/2019 0808   CO2 18 (L) 05/19/2019 0808   GLUCOSE 84 05/19/2019 0808   BUN <5 (L) 05/19/2019 0808   CREATININE 0.37 (L) 05/19/2019 0808   CALCIUM 8.0 (L) 05/19/2019 0808   PROT 5.5 (L) 05/19/2019 0808   ALBUMIN 2.4 (L) 05/19/2019 0808   AST 14 (L) 05/19/2019 0808   ALT 10  05/19/2019 0808   ALKPHOS 83 05/19/2019 0808   BILITOT 0.3 05/19/2019 0808   GFRNONAA >60 05/19/2019 0808   GFRAA >60 05/19/2019 0808   PCR= 0.07  Prenatal Transfer Tool  Maternal Diabetes: No Genetic Screening: Normal Maternal Ultrasounds/Referrals: Normal Fetal Ultrasounds or other Referrals:  None Maternal Substance Abuse:  No Significant Maternal Medications:  Meds include: Zoloft Significant Maternal Lab Results: Group B Strep negative    Assessment/Plan: 28 y.o. [id number] [redacted]w[redacted]d IOL      -admit to L&D, routine orders     -Cytotec for ripening then Pitocin per orders Gest HTN     -normotensive     -call  for SBP>160, DBP>110 HSV     -last outbreak 1 year ago, not on suppressive therapy Asthma     -last episode July 2020 FHR category 1 GBS negative  Epidural PRN  Active management of third stage, hx of PPH     -Cytotec 1082mcg available at delivery     -Avoid Methergine and Hemabate D/T medical hx  Dr. Hetty Ely of admission and plan of care  Arrie Eastern CNM, MSN 05/20/2019 5:36 AM

## 2019-05-20 NOTE — Anesthesia Procedure Notes (Deleted)
Epidural

## 2019-05-21 LAB — CBC
HCT: 26.5 % — ABNORMAL LOW (ref 36.0–46.0)
Hemoglobin: 8.1 g/dL — ABNORMAL LOW (ref 12.0–15.0)
MCH: 22.3 pg — ABNORMAL LOW (ref 26.0–34.0)
MCHC: 30.6 g/dL (ref 30.0–36.0)
MCV: 73 fL — ABNORMAL LOW (ref 80.0–100.0)
Platelets: 209 10*3/uL (ref 150–400)
RBC: 3.63 MIL/uL — ABNORMAL LOW (ref 3.87–5.11)
RDW: 16.9 % — ABNORMAL HIGH (ref 11.5–15.5)
WBC: 9.3 10*3/uL (ref 4.0–10.5)
nRBC: 0 % (ref 0.0–0.2)

## 2019-05-21 MED ORDER — POLYSACCHARIDE IRON COMPLEX 150 MG PO CAPS
150.0000 mg | ORAL_CAPSULE | Freq: Every day | ORAL | Status: DC
  Administered 2019-05-21 – 2019-05-22 (×2): 150 mg via ORAL
  Filled 2019-05-21 (×2): qty 1

## 2019-05-21 NOTE — Progress Notes (Signed)
MOB was referred for history of depression/anxiety. * Referral screened out by Clinical Social Worker because none of the following criteria appear to apply: ~ History of anxiety/depression during this pregnancy, or of post-partum depression following prior delivery. ~ Diagnosis of anxiety and/or depression within last 3 years OR * MOB's symptoms currently being treated with medication and/or therapy. Per MOB's [facility] records, Zoloft was prescribed as a medication.    Please contact the Clinical Social Worker if needs arise, by [facility] request, or if MOB scores greater than 9/yes to question 10 on Edinburgh Postpartum Depression Screen.   **No concerns noted in MOB's [facility] records regarding depression.Jeanette Caprice S. Chanay Nugent, MSW, LCSW [facility] [place] [phone]

## 2019-05-21 NOTE — Anesthesia Postprocedure Evaluation (Signed)
Anesthesia Post Note  Patient: [place]  Procedure(s) Performed: AN AD [place]     Patient location during evaluation: Mother Baby Anesthesia Type: Epidural Level of consciousness: awake and alert Pain management: pain level controlled Vital Signs Assessment: post-procedure vital signs reviewed and stable Respiratory status: spontaneous breathing, nonlabored ventilation and respiratory function stable Cardiovascular status: stable Postop Assessment: no headache, no backache and epidural receding Anesthetic complications: no    Last Vitals:  Vitals:   05/21/19 0513 05/21/19 0653  BP: (!) 108/95 123/86  Pulse: 82   Resp: 18   Temp: 36.6 C   SpO2: 100%     Last Pain:  Vitals:   05/21/19 0513  TempSrc: Oral  PainSc: 3    Pain Goal: Patients Stated Pain Goal: 3 (05/18/19 2225)                 Rayvon Char

## 2019-05-21 NOTE — Progress Notes (Signed)
Patient reports feeling dizzy when ambulating in hallway earlier and slightly dizzy, now, when sitting in chair. She is currently on the phone talking and laughing. BP WNL. Encouraged fluids, slowly standing, calling if dizziness worsens.

## 2019-05-21 NOTE — Progress Notes (Addendum)
PPD# 1 SVD w/ intact perineum Information for the patient's newborn:  MALAYSIAH, HUGER [id number]  female    Baby Name [facility] Circumcision plans out-patient circ w/ [facility]   S:   Reports feeling well-rested, denies HA Tolerating PO fluid and solids No nausea or vomiting Bleeding is moderate, no clots Pain controlled with PO meds Up ad lib / ambulatory / voiding w/o difficulty Bottle feeding  O:   VS: BP 123/86 (BP Location: Right Arm)   Pulse 82   Temp 97.9 F (36.6 C) (Oral)   Resp 18   Ht 5' 3.5" (1.613 m)   Wt 93 kg   LMP 09/03/2018   SpO2 100%   Breastfeeding Unknown   BMI 35.76 kg/m   LABS:  Recent Labs    05/20/19 1038 05/21/19 0552  WBC 8.2 9.3  HGB 8.6* 8.1*  PLT 230 209   Blood type: --/--/B POS, B POS Performed at [facility] [address], [place], [place] [address]  [phone]) Rubella:                        I&O: Intake/Output      11/05 0701 - 11/06 0700 11/06 0701 - 11/07 0700   I.V. (mL/kg) 0 (0)    Other 0    Total Intake(mL/kg) 0 (0)    Urine (mL/kg/hr) 600 (0.3)    Blood 200    Total Output 800    Net -800           Physical Exam: Alert and oriented X3 Lungs: Clear and unlabored Heart: regular rate and rhythm / no mumurs Abdomen: soft, non-tender, non-distended  Fundus: firm, non-tender, U-1 Perineum: non-edematous, no hemorrhoids Lochia: appropriate Extremities: No edema, no calf pain or tenderness    A/P:  PPD # 1      -normal exam GHTN     -normotensive, HA resolved Anemia     -asymptomatic     -start Niferex today Routine post partum orders Anticipate D/C on 05/22/19  Plan reviewed w/ Dr. Cletis Media @ sign-out.   Arrie Eastern, MSN, CNM 05/21/2019, 7:09 AM

## 2019-05-22 DIAGNOSIS — O9081 Anemia of the puerperium: Secondary | ICD-10-CM | POA: Diagnosis not present

## 2019-05-22 MED ORDER — IBUPROFEN 600 MG PO TABS: 600.0000 mg | ORAL_TABLET | Freq: Four times a day (QID) | ORAL | 0 refills | Status: DC

## 2019-05-22 NOTE — Discharge Summary (Signed)
SVD OB Discharge Summary     Patient Name: Anna [place] DOB: Oct 07, 1990 MRN: [id number]  Date of admission: 05/17/2019 Delivering MD: Sanjuana Kava  Date of delivery: 05/20/2019 Type of delivery: SVD  Newborn Data: Sex: Baby Female Circumcision: desires in pt circ prior to discharge by Dr Simona Huh, pt changed to private insurances Live born female  Birth Weight: 7 lb 5.2 oz (3323 g) APGAR: 8, 9  Newborn Delivery   Birth date/time: 05/20/2019 21:53:00 Delivery type: Vaginal, Spontaneous      Feeding: bottle Infant being discharge to home with mother in stable condition.   Admitting diagnosis: HBP, headaches Intrauterine pregnancy: [redacted]w[redacted]d     Secondary diagnosis:  Active Problems:   Gestational hypertension   Headache in pregnancy, antepartum, third trimester   Hypertension affecting pregnancy   SVD (11/5)   Postpartum anemia                                Complications: None                                                              Intrapartum Procedures: spontaneous vaginal delivery Postpartum Procedures: none Complications-Operative and Postpartum: PP anemia  Augmentation: AROM, Pitocin and Cytotec   History of Present Illness: Anna [place] is a 28 y.o. female, [id number], who presents at [redacted]w[redacted]d weeks gestation. The patient has been followed at  [facility] Gynecology  Her pregnancy has been complicated by:  Patient Active Problem List   Diagnosis Date Noted  . Postpartum anemia 05/22/2019  . SVD (11/5) 05/20/2019  . Headache in pregnancy, antepartum, third trimester 05/18/2019  . Hypertension affecting pregnancy 05/18/2019  . Gestational hypertension 04/22/2018  . Genital herpes simplex 02/27/2018  . Asthma 04/04/2017  . SAB (spontaneous abortion) 04/04/2017  . H/O unilateral salpingectomy 04/04/2017  . Pregnant 03/27/2017    Hospital course:  Induction of Labor With Vaginal Delivery   28 y.o. yo [phone] at [redacted]w[redacted]d was admitted to  the hospital 05/17/2019 for induction of labor.  Indication for induction: Gestational hypertension.  Patient had an uncomplicated labor course as follows: Membrane Rupture Time/Date: 7:14 PM ,05/20/2019   Intrapartum Procedures: Episiotomy: None [1]                                         Lacerations:  None [1]  Patient had delivery of a Viable infant.  Information for the patient's newborn:  YASMIN, SHARAF [id number]  Delivery Method: Vag-Spont    05/20/2019  Details of delivery can be found in separate delivery note.  Patient had a routine postpartum course. Patient is discharged home 05/22/19. Postpartum Day # 3 : S/P NSVD due to [facility] with neg preeclampsia labs and no meds, had HA on admission but since has resolved with fioricet. Pt currently denies HA< RUQ pain or vision changes, no meds, and BP 124/85 Patient up ad lib, denies syncope or dizziness. Reports consuming regular diet without issues and denies N/V. Patient reports 0 bowel movement + passing flatus.  Denies issues with urination and reports bleeding is "lighter."  Patient is bottle feeding and  reports going well.  Desires paraguard IUD at 36 wPPV for postpartum contraception.  Pain is being appropriately managed with use of po meds. PP anemia with mild fatigue, qbl was 242mls, and hgb drop from 8.6-8.1, on iron and will continue at home.    Physical exam  Vitals:   05/21/19 1247 05/21/19 1810 05/21/19 2233 05/22/19 0552  BP: 113/73 122/73 131/89 124/85  Pulse: 85 85 79 78  Resp: 18  18 16   Temp: 98.2 F (36.8 C)  98 F (36.7 C) 97.7 F (36.5 C)  TempSrc: Oral  Oral Oral  SpO2: 98% 100% 99% 98%  Weight:      Height:       General: alert, cooperative and no distress Lochia: appropriate Uterine Fundus: firm Perineum: intact DVT Evaluation: No evidence of DVT seen on physical exam. Negative Homan's sign. No cords or calf tenderness. No significant calf/ankle edema. No clonus, 2+ DTR Patellar   Labs: Lab Results   Component Value Date   WBC 9.3 05/21/2019   HGB 8.1 (L) 05/21/2019   HCT 26.5 (L) 05/21/2019   MCV 73.0 (L) 05/21/2019   PLT 209 05/21/2019   CMP Latest Ref Rng & Units 05/19/2019  Glucose 70 - 99 mg/dL 84  BUN 6 - 20 mg/dL <5(L)  Creatinine 0.44 - 1.00 mg/dL 0.37(L)  Sodium 135 - 145 mmol/L 138  Potassium 3.5 - 5.1 mmol/L 3.5  Chloride 98 - 111 mmol/L 112(H)  CO2 22 - 32 mmol/L 18(L)  Calcium 8.9 - 10.3 mg/dL 8.0(L)  Total Protein 6.5 - 8.1 g/dL 5.5(L)  Total Bilirubin 0.3 - 1.2 mg/dL 0.3  Alkaline Phos 38 - 126 U/L 83  AST 15 - 41 U/L 14(L)  ALT 0 - 44 U/L 10    Date of discharge: 05/22/2019 Discharge Diagnoses: Term Pregnancy-delivered Discharge instruction: per After Visit Summary and "Baby and Me Booklet".  After visit meds:   Activity:           unrestricted and pelvic rest Advance as tolerated. Pelvic rest for 6 weeks.  Diet:                routine Medications: PNV, Ibuprofen, Colace and Iron Postpartum contraception: IUD Paragard Condition:  Pt discharge to home with baby in stable Anemia: Iron at home with colace  GHT: no meds, monitor s/sx or preeclampsia warning signs educated on, pt to make 1 week BP check in office   Meds: Allergies as of 05/22/2019      Reactions   Contrast Media [iodinated Diagnostic Agents] Itching, Other (See Comments)   Itching on tongue      Medication List    STOP taking these medications   Feraheme 510 MG/17ML Soln injection Generic drug: ferumoxytol     TAKE these medications   albuterol 108 (90 Base) MCG/ACT inhaler Commonly known as: VENTOLIN HFA Inhale into the lungs every 4 (four) hours as needed for wheezing or shortness of breath.   calcium carbonate 500 MG chewable tablet Commonly known as: TUMS - dosed in mg elemental calcium Chew 1 tablet by mouth 5 (five) times daily as needed for indigestion or heartburn.   ferrous sulfate 325 (65 FE) MG tablet Take 1 tablet (325 mg total) by mouth daily with breakfast.    Fusion 65-65-25-30 MG Caps Take 1 capsule by mouth daily.   ibuprofen 600 MG tablet Commonly known as: ADVIL Take 1 tablet (600 mg total) by mouth every 6 (six) hours.  Discharge Follow Up:  Follow-up [facility] Follow up.   Specialty: Obstetrics and Gynecology Why: 1 week f/u for BP check and 6 week PPV for IUD placement, paraguard  Contact information: [place]. [address] [place] [phone] [place], NP-C, CNM 05/22/2019, 7:07 AM  Noralyn Pick, FNP

## 2019-06-28 DIAGNOSIS — Z7251 High risk heterosexual behavior: Secondary | ICD-10-CM | POA: Diagnosis not present

## 2019-07-14 ENCOUNTER — Observation Stay (HOSPITAL_COMMUNITY)
Admission: AD | Admit: 2019-07-14 | Discharge: 2019-07-14 | Disposition: A | Discharge status: Home / Self Care | Payer: BC Managed Care – PPO | Attending: Obstetrics & Gynecology | Admitting: Obstetrics & Gynecology

## 2019-07-14 ENCOUNTER — Other Ambulatory Visit: Payer: Self-pay

## 2019-07-14 ENCOUNTER — Observation Stay (HOSPITAL_COMMUNITY): Payer: BC Managed Care – PPO

## 2019-07-14 ENCOUNTER — Encounter (HOSPITAL_COMMUNITY): Payer: Self-pay | Admitting: Obstetrics & Gynecology

## 2019-07-14 DIAGNOSIS — Z20828 Contact with and (suspected) exposure to other viral communicable diseases: Secondary | ICD-10-CM | POA: Diagnosis not present

## 2019-07-14 DIAGNOSIS — R519 Headache, unspecified: Secondary | ICD-10-CM

## 2019-07-14 DIAGNOSIS — M79606 Pain in leg, unspecified: Secondary | ICD-10-CM | POA: Diagnosis not present

## 2019-07-14 DIAGNOSIS — N939 Abnormal uterine and vaginal bleeding, unspecified: Secondary | ICD-10-CM | POA: Diagnosis not present

## 2019-07-14 DIAGNOSIS — O1495 Unspecified pre-eclampsia, complicating the puerperium: Secondary | ICD-10-CM | POA: Diagnosis present

## 2019-07-14 DIAGNOSIS — O894 Spinal and epidural anesthesia-induced headache during the puerperium: Principal | ICD-10-CM | POA: Insufficient documentation

## 2019-07-14 DIAGNOSIS — O152 Eclampsia in the puerperium: Secondary | ICD-10-CM | POA: Insufficient documentation

## 2019-07-14 DIAGNOSIS — O9089 Other complications of the puerperium, not elsewhere classified: Secondary | ICD-10-CM

## 2019-07-14 DIAGNOSIS — R110 Nausea: Secondary | ICD-10-CM | POA: Diagnosis not present

## 2019-07-14 LAB — COMPREHENSIVE METABOLIC PANEL
ALT: 22 U/L (ref 0–44)
AST: 21 U/L (ref 15–41)
Albumin: 4 g/dL (ref 3.5–5.0)
Alkaline Phosphatase: 54 U/L (ref 38–126)
Anion gap: 9 (ref 5–15)
BUN: 7 mg/dL (ref 6–20)
CO2: 26 mmol/L (ref 22–32)
Calcium: 9.2 mg/dL (ref 8.9–10.3)
Chloride: 104 mmol/L (ref 98–111)
Creatinine, Ser: 0.49 mg/dL (ref 0.44–1.00)
GFR calc Af Amer: >60 mL/min (ref >60)
GFR calc non Af Amer: >60 mL/min (ref >60)
Glucose, Bld: 97 mg/dL (ref 70–99)
Potassium: 3.9 mmol/L (ref 3.5–5.1)
Sodium: 139 mmol/L (ref 135–145)
Total Bilirubin: 0.1 mg/dL — ABNORMAL LOW (ref 0.3–1.2)
Total Protein: 7.6 g/dL (ref 6.5–8.1)

## 2019-07-14 LAB — CBC
HCT: 35.1 % — ABNORMAL LOW (ref 36.0–46.0)
Hemoglobin: 10.8 g/dL — ABNORMAL LOW (ref 12.0–15.0)
MCH: 22.9 pg — ABNORMAL LOW (ref 26.0–34.0)
MCHC: 30.8 g/dL (ref 30.0–36.0)
MCV: 74.5 fL — ABNORMAL LOW (ref 80.0–100.0)
Platelets: 346 10*3/uL (ref 150–400)
RBC: 4.71 MIL/uL (ref 3.87–5.11)
RDW: 18.6 % — ABNORMAL HIGH (ref 11.5–15.5)
WBC: 7.5 10*3/uL (ref 4.0–10.5)
nRBC: 0 % (ref 0.0–0.2)

## 2019-07-14 LAB — PROTEIN / CREATININE RATIO, URINE
Creatinine, Urine: 93.98 mg/dL
Protein Creatinine Ratio: 0.14 mg/mg{Cre} (ref 0.00–0.15)
Total Protein, Urine: 13 mg/dL

## 2019-07-14 LAB — SARS CORONAVIRUS 2 (TAT 6-24 HRS): SARS Coronavirus 2: NEGATIVE

## 2019-07-14 IMAGING — CT CT HEAD W/O CM
4 series · 17 of 47 positions shown, 19 images · non-contrast
Comparison: No priors.

CLINICAL DATA: 28-year-old female with history of severe post
partum headache. Elevated blood pressure.

EXAM:
CT HEAD WITHOUT CONTRAST
TECHNIQUE: Contiguous axial images were obtained from the base of the skull
through the vertex without intravenous contrast.

[Series 2: head wo · axial · 0.41mm/px · z∈[-124,-4]mm · 7 of 32 slices shown, 9 images]
[im 4/32  brain]
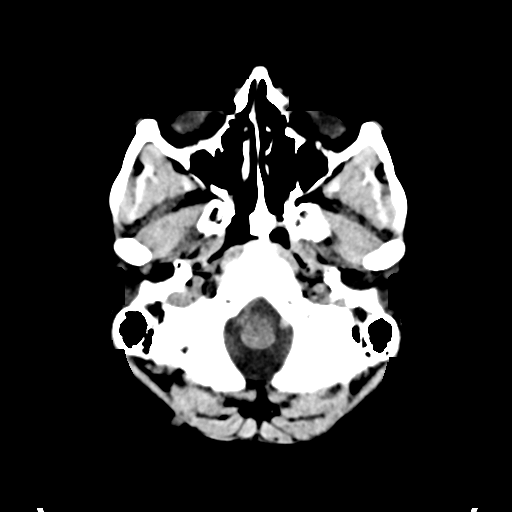
[im 4/32  bone]
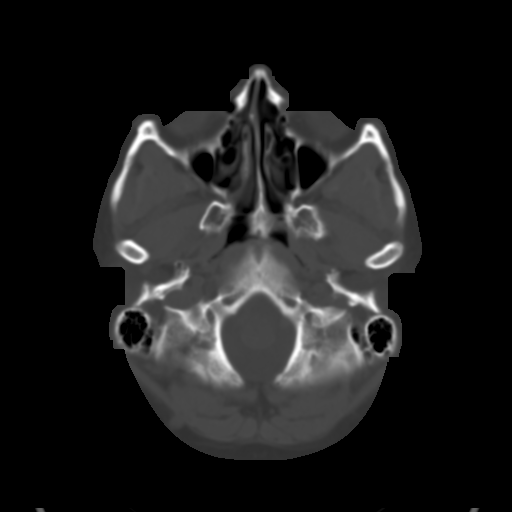
[im 8/32  brain]
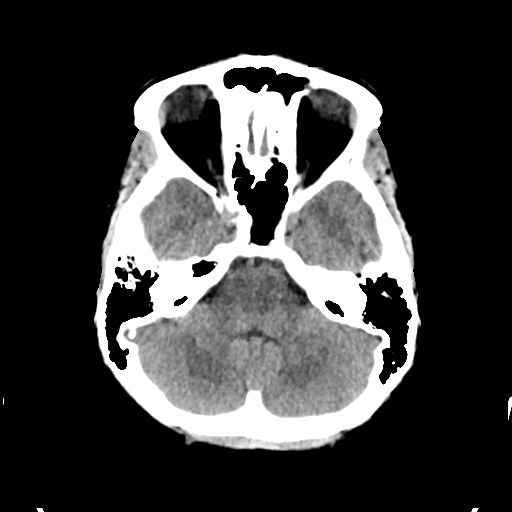
[im 12/32  brain]
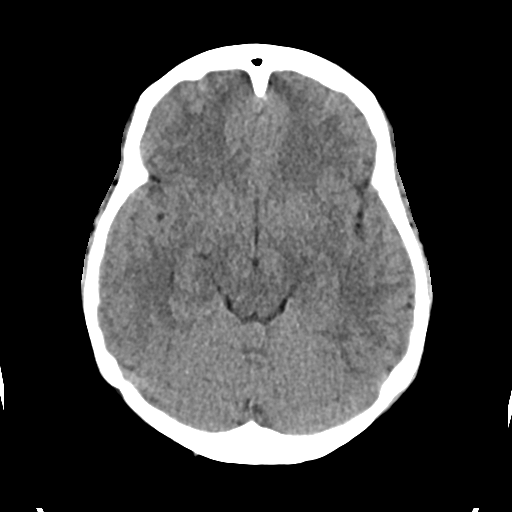
[im 16/32  brain]
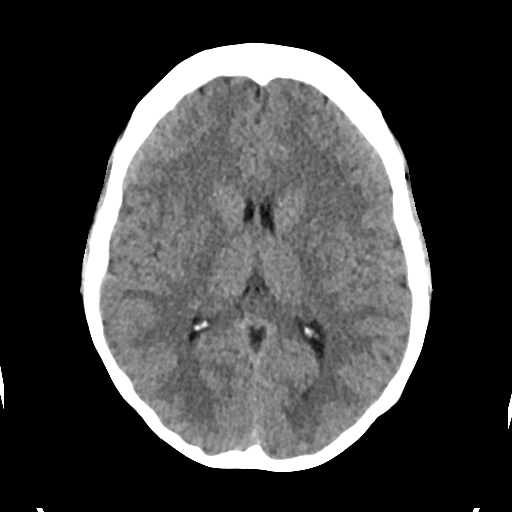
[im 20/32  brain]
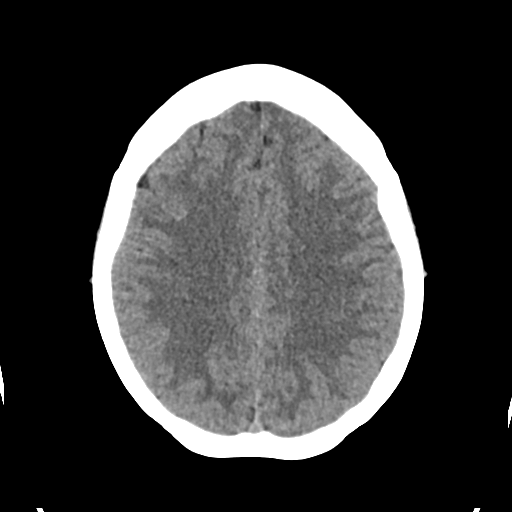
[im 20/32  bone]
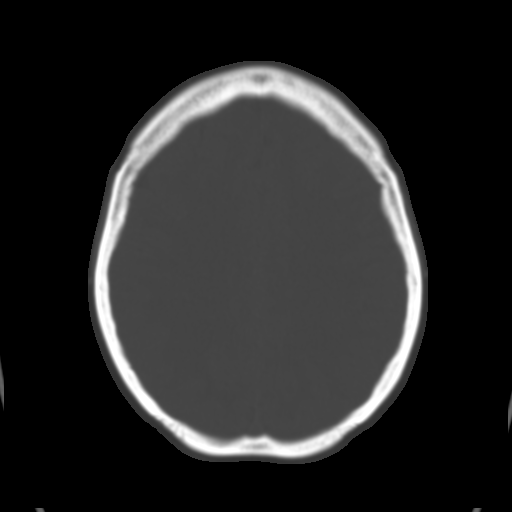
[im 24/32  brain]
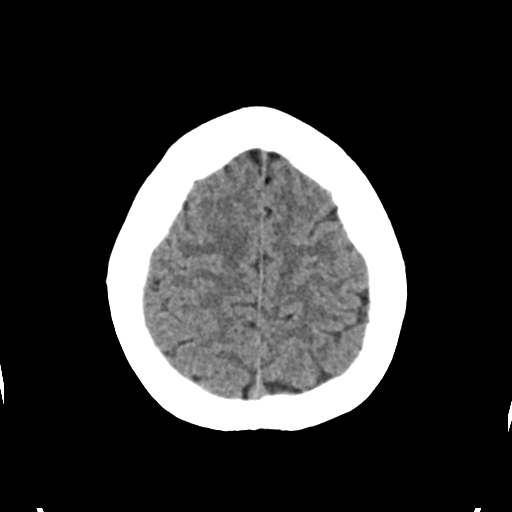
[im 28/32  brain]
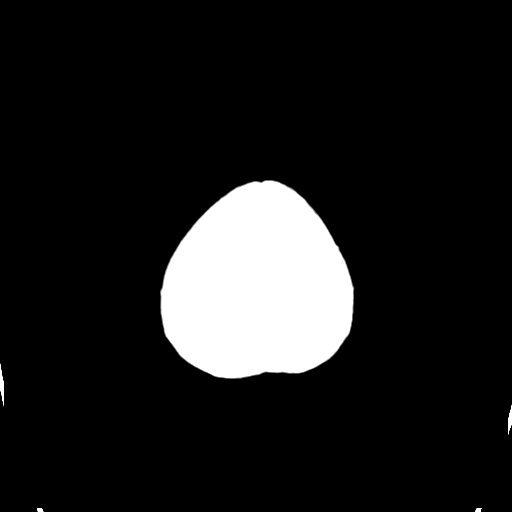

[Series 3: head bone · axial · 0.41mm/px · z∈[-125,-69]mm · 4 of 80 slices shown]
[im 8/80  bone]
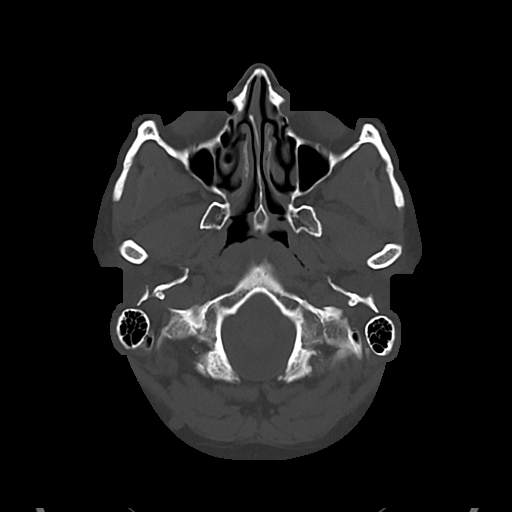
[im 16/80  bone]
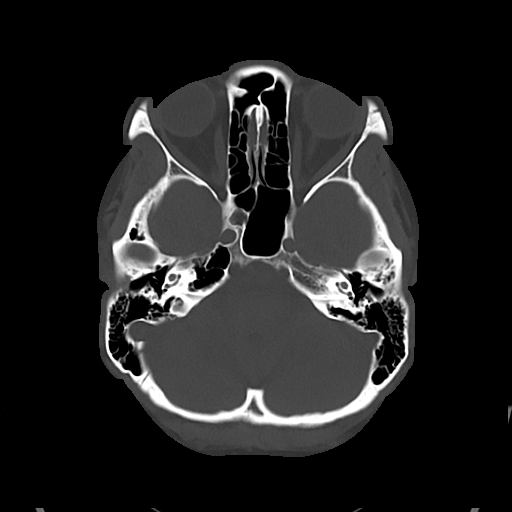
[im 24/80  bone]
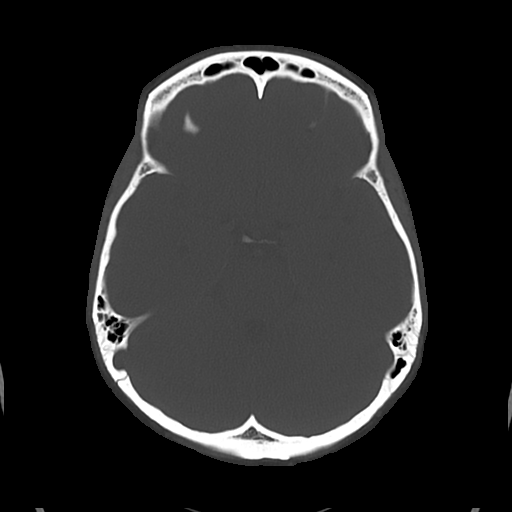
[im 36/80  bone]
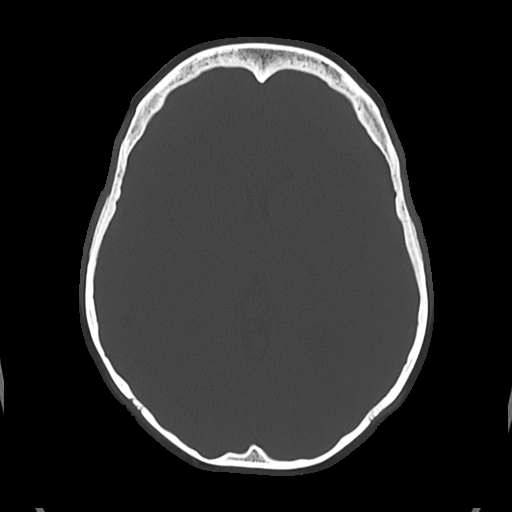

[Series 4: cor soft · coronal · 0.32mm/px · 3 of 67 slices shown]
[im 23/67  brain]
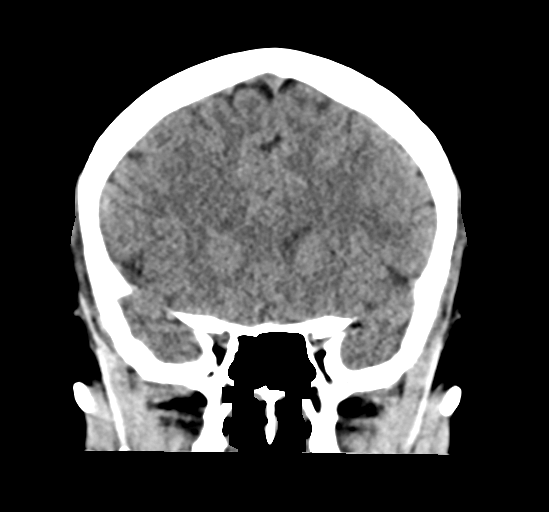
[im 30/67  brain]
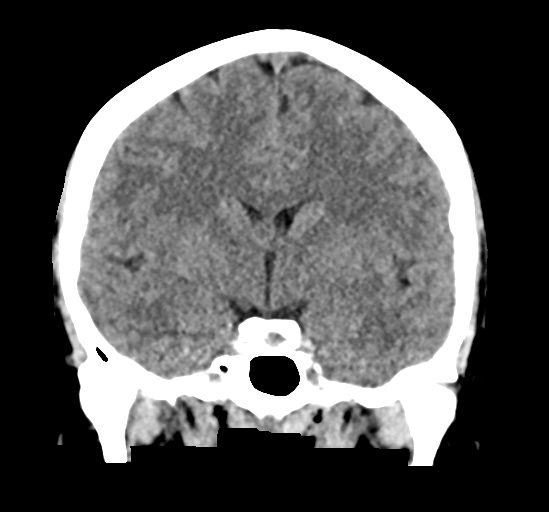
[im 37/67  brain]
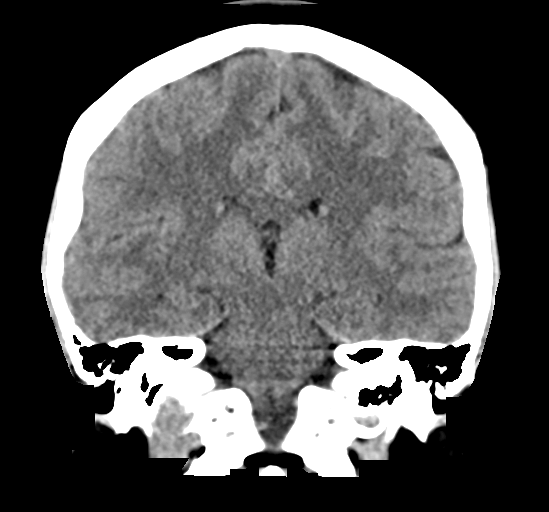

[Series 5: sag soft · sagittal · 0.32mm/px · 3 of 59 slices shown]
[im 20/59  brain]
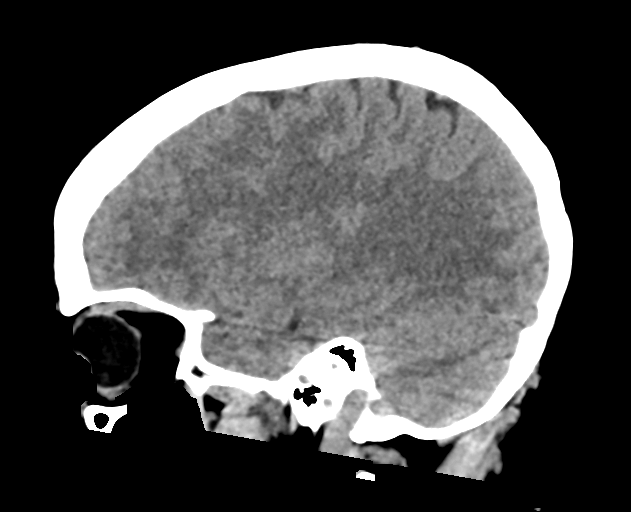
[im 30/59  brain]
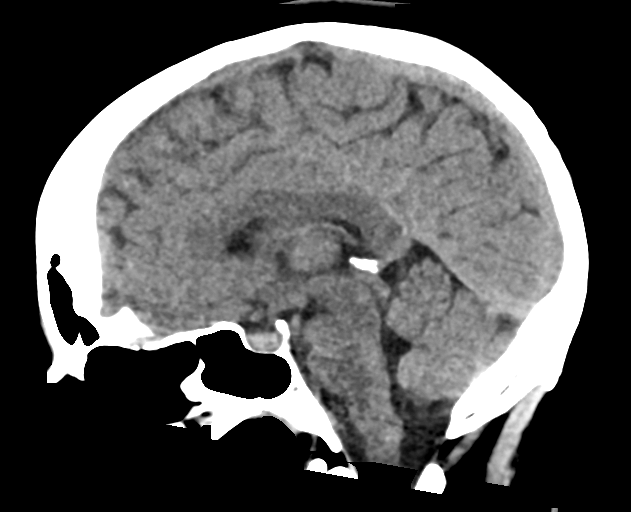
[im 39/59  brain]
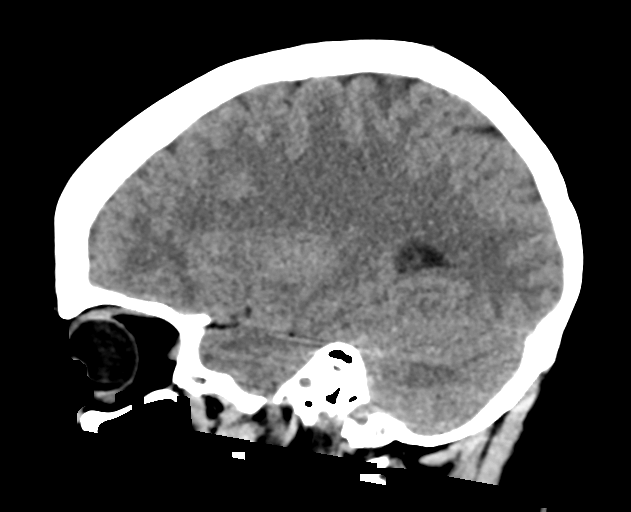

[17 of 47 positions shown; findings below may reference images not displayed]

FINDINGS: Brain: No evidence of acute infarction, hemorrhage, hydrocephalus,
extra-axial collection or mass lesion/mass effect.

Vascular: No hyperdense vessel or unexpected calcification.

Skull: Normal. Negative for fracture or focal lesion.

Sinuses/Orbits: No acute finding.

Other: None.
IMPRESSION: 1. No acute intracranial abnormalities. The appearance of the brain
is normal.

## 2019-07-14 MED ORDER — LABETALOL HCL 5 MG/ML IV SOLN: 20.0000 mg | INTRAVENOUS | Status: DC | PRN

## 2019-07-14 MED ORDER — ACETAMINOPHEN 325 MG PO TABS: 650.0000 mg | ORAL_TABLET | Freq: Four times a day (QID) | ORAL | Status: DC | PRN

## 2019-07-14 MED ORDER — ZOLPIDEM TARTRATE 5 MG PO TABS: 5.0000 mg | ORAL_TABLET | Freq: Every evening | ORAL | Status: DC | PRN

## 2019-07-14 MED ORDER — ONDANSETRON HCL 4 MG/2ML IJ SOLN: 4.0000 mg | Freq: Four times a day (QID) | INTRAMUSCULAR | Status: DC | PRN

## 2019-07-14 MED ORDER — NIFEDIPINE ER OSMOTIC RELEASE 30 MG PO TB24
30.0000 mg | ORAL_TABLET | Freq: Every day | ORAL | Status: DC
  Administered 2019-07-14: 30 mg via ORAL
  Filled 2019-07-14: qty 1

## 2019-07-14 MED ORDER — LABETALOL HCL 5 MG/ML IV SOLN: 80.0000 mg | INTRAVENOUS | Status: DC | PRN

## 2019-07-14 MED ORDER — OXYCODONE-ACETAMINOPHEN 5-325 MG PO TABS: 1.0000 | ORAL_TABLET | Freq: Four times a day (QID) | ORAL | Status: DC | PRN

## 2019-07-14 MED ORDER — NIFEDIPINE ER 30 MG PO TB24: 30.0000 mg | ORAL_TABLET | Freq: Every day | ORAL | 2 refills | Status: DC

## 2019-07-14 MED ORDER — LACTATED RINGERS IV SOLN: INTRAVENOUS | Status: DC

## 2019-07-14 MED ORDER — LABETALOL HCL 5 MG/ML IV SOLN: 40.0000 mg | INTRAVENOUS | Status: DC | PRN

## 2019-07-14 NOTE — H&P (Signed)
28 y.o.  [id number] sent from office for history of severe headache in the last 48 hours with episode of "loss of time" and almost losing consciousness. She reports h/o blurry vision, no chest pain or shortness of breath. Blood pressure in office was 150/92 so concern for post partum preeclampsia. Patient did not start combined birth control s/p post partum visit.  She did however start phentermine for weight loss.    Past Medical History:  Diagnosis Date  . Anemia   . Asthma   . Fibroid   . Genital herpes simplex 02/27/2018  . Gestational HTN 04/22/2018  . Gestational hypertension 04/22/2018  . H/O unilateral salpingectomy 04/04/2017  . Ovarian tumor (benign), left   . Pregnancy induced hypertension   . Pregnant 03/27/2017  . SAB (spontaneous abortion) 04/04/2017    Past Surgical History:  Procedure Laterality Date  . ELBOW SURGERY    . LEFT OOPHORECTOMY  2013    OB History  Gravida Para Term Preterm AB Living  6 3 3  0 3 3  SAB TAB Ectopic Multiple Live Births  2 1   0 3    # Outcome Date GA Lbr Len/2nd Weight Sex Delivery Anes PTL Lv  6 Term 05/20/19 [redacted]w[redacted]d 12:19 / 00:20 3323 g M Vag-Spont EPI  LIV  5 Term 04/23/18 [redacted]w[redacted]d  2865 g M Vag-Spont EPI  LIV     Complications: Other Excessive Bleeding  4 Term 01/17/12 [redacted]w[redacted]d   F Vag-Spont EPI  LIV  3 SAB           2 SAB           1 TAB             Obstetric Comments  Induced due to HTN, PP hemorrhage w/2019 delivery    Social History   Socioeconomic History  . Marital status: Married    Spouse name: Not on file  . Number of children: Not on file  . Years of education: Not on file  . Highest education level: Not on file  Occupational History  . Not on file  Tobacco Use  . Smoking status: Never Smoker  . Smokeless tobacco: Never Used  Substance and Sexual Activity  . Alcohol use: No  . Drug use: No  . Sexual activity: Yes    Birth control/protection: None  Other Topics Concern  . Not on file  Social History Narrative  . Not  on file   Social Determinants of Health   Financial Resource Strain: Low Risk   . Difficulty of Paying Living Expenses: Not hard at all  Food Insecurity: No Food Insecurity  . Worried About Charity fundraiser in the Last Year: Never true  . Ran Out of Food in the Last Year: Never true  Transportation Needs: No Transportation Needs  . Lack of Transportation (Medical): No  . Lack of Transportation (Non-Medical): No  Physical Activity: Inactive  . Days of Exercise per Week: 0 days  . Minutes of Exercise per Session: 0 min  Stress: Stress Concern Present  . Feeling of Stress : To some extent  Social Connections: Somewhat Isolated  . Frequency of Communication with Friends and Family: More than three times a week  . Frequency of Social Gatherings with Friends and Family: More than three times a week  . Attends Religious Services: Never  . Active Member of Clubs or Organizations: No  . Attends Archivist Meetings: Never  . Marital Status: Married  Human resources officer Violence:   .  Fear of Current or Ex-Partner: Not on file  . Emotionally Abused: Not on file  . Physically Abused: Not on file  . Sexually Abused: Not on file   Contrast media [iodinated diagnostic agents]   Physical Exam:  Vitals:   07/14/19 1720 07/14/19 1951  BP: 136/84 119/81  Pulse: 72 78  Resp: 18 18  Temp:  97.8 F (36.6 C)  SpO2: 100% 100%    General: Alert, awake and oriented x 3 Lungs/Cor:  NAD Abdomen:  soft, gravid Ext:  no cords, erythema, negative Holman's sign Neuro: Good strength upper and lower extremities 2+ DTRs bilaterally no clonus SVE:  NA  Assessment / Plan: 28 year old 12 weeks post partum, concern for late presentation postpartum preeclampsia.  Will get preeclampsia labs, and CT head for h/o loss of time, blurry vision and severe headache Will monitor blood pressures closely if severe range will initiate Labetalol protocol and start magnesium sulfate.   Rogelio Seen Odysseus Cada

## 2019-07-14 NOTE — Progress Notes (Signed)
MD progress note  Patient requesting to be discharged  Subjective: Patient desires to be discharged, she notes being asymptomatic now, no headache no visual changes, no nausea or vomiting. No RUQ pain  Objective: Blood pressure 119/81, pulse 78, temperature 97.8 F (36.6 C), temperature source Oral, resp. rate 18, height 5' 3.5" (1.613 m), weight 81.6 kg, last menstrual period 09/03/2018, SpO2 100 %, unknown if currently breastfeeding.  Physical Exam:  General: alert and cooperative   Recent Labs    07/14/19 1751  HGB 10.8*  HCT 35.1*  Platelets: 346 CMP: Normal  CT head: no pathological findings.   Assessment/Plan: 28 year old with h/o severe headache and neurologic sx recently CT head negative Pre E labs neg PCR pending Blood pressures have been normal.  Will discharge patient home for close follow up in office.    LOS: 0 days   [facility] 07/14/2019, 8:32 PM

## 2019-07-14 NOTE — Discharge Summary (Signed)
Physician Discharge Summary  Patient ID: Anna Burns MRN: [id number] DOB/AGE: 1990/09/27 28 y.o.  Admit date: 07/14/2019 Discharge date: 07/14/2019  Admission Diagnoses:suspected postpartum preeclampsia / Post partum headache  Discharge Diagnoses:  Active Problems:   Postpartum headache   Preeclampsia in postpartum period  Hospital Course: Patient wanting to sign out AMA after several hours of admission for 23 hour obs.  All Pre E labs were within normal limits. Ct head w/o contrast negative  Consults: None  Significant Diagnostic Studies: CT head neg  Treatments: IV hydration  Discharge Exam: Blood pressure 119/81, pulse 78, temperature 97.8 F (36.6 C), temperature source Oral, resp. rate 18, height 5' 3.5" (1.613 m), weight 81.6 kg, last menstrual period 09/03/2018, SpO2 100 %, unknown if currently breastfeeding. General appearance: alert, cooperative and no distress Neurologic: Alert and oriented X 3, normal strength and tone. Normal symmetric reflexes. Normal coordination and gait  Discharged Condition: good  Disposition: home    Allergies as of 07/14/2019      Reactions   Contrast Media [iodinated Diagnostic Agents] Itching, Other (See Comments)   Itching on tongue      Medication List    TAKE these medications   albuterol 108 (90 Base) MCG/ACT inhaler Commonly known as: VENTOLIN HFA Inhale into the lungs every 4 (four) hours as needed for wheezing or shortness of breath.   calcium carbonate 500 MG chewable tablet Commonly known as: TUMS - dosed in mg elemental calcium Chew 1 tablet by mouth 5 (five) times daily as needed for indigestion or heartburn.   ferrous sulfate 325 (65 FE) MG tablet Take 1 tablet (325 mg total) by mouth daily with breakfast.   Fusion 65-65-25-30 MG Caps Take 1 capsule by mouth daily.   ibuprofen 600 MG tablet Commonly known as: ADVIL Take 1 tablet (600 mg total) by mouth every 6 (six) hours.         SignedSanjuana Kava 07/14/2019, 8:44 PM

## 2019-08-20 DIAGNOSIS — Z124 Encounter for screening for malignant neoplasm of cervix: Secondary | ICD-10-CM | POA: Diagnosis not present

## 2019-08-20 DIAGNOSIS — D582 Other hemoglobinopathies: Secondary | ICD-10-CM | POA: Diagnosis not present

## 2019-08-20 DIAGNOSIS — Z6835 Body mass index (BMI) 35.0-35.9, adult: Secondary | ICD-10-CM | POA: Diagnosis not present

## 2019-08-20 DIAGNOSIS — Z01419 Encounter for gynecological examination (general) (routine) without abnormal findings: Secondary | ICD-10-CM | POA: Diagnosis not present

## 2020-05-11 LAB — OB RESULTS CONSOLE RPR: RPR: NONREACTIVE

## 2020-05-11 LAB — OB RESULTS CONSOLE ABO/RH: RH Type: POSITIVE

## 2020-05-11 LAB — OB RESULTS CONSOLE HEPATITIS B SURFACE ANTIGEN: Hepatitis B Surface Ag: NEGATIVE

## 2020-05-11 LAB — OB RESULTS CONSOLE ANTIBODY SCREEN: Antibody Screen: NEGATIVE

## 2020-05-11 LAB — OB RESULTS CONSOLE GC/CHLAMYDIA
Chlamydia: NEGATIVE
Gonorrhea: NEGATIVE

## 2020-05-11 LAB — OB RESULTS CONSOLE RUBELLA ANTIBODY, IGM: Rubella: IMMUNE

## 2020-05-11 LAB — OB RESULTS CONSOLE HIV ANTIBODY (ROUTINE TESTING): HIV: NONREACTIVE

## 2020-07-15 NOTE — L&D Delivery Note (Signed)
Delivery Note Patient pushed for less than 10 minutes after she was noted to be C/C/+3. At 7:17 PM a viable and healthy female was delivered via Vaginal, Spontaneous (Presentation: LOA) over intact perineum and through loose nuchal cord.  APGAR:9/9 weight pending.   Delayed cord clamping done and cord cut by father. Cord blood obtained. Placenta spontaneously delivered intact 3 vessels noted.  There were no vaginal or perineal tears.  Vaginal sweep done and uterus firm after IV pitocin and one dose of TXA for hemorrhage prophylaxis.  Patient tolerated delivery well.     Anesthesia: Epidural Episiotomy:  n/a Lacerations:  none Est. Blood Loss (mL):  200  Mom to postpartum.  Baby to Couplet care / Skin to Skin.  Anna Burns 11/10/2020, 7:28 PM

## 2020-10-09 ENCOUNTER — Encounter (HOSPITAL_COMMUNITY): Payer: Self-pay

## 2020-10-09 ENCOUNTER — Other Ambulatory Visit: Payer: Self-pay

## 2020-10-09 ENCOUNTER — Inpatient Hospital Stay (HOSPITAL_COMMUNITY)
Admission: AD | Admit: 2020-10-09 | Discharge: 2020-10-09 | Disposition: A | Discharge status: Home / Self Care | Payer: Medicaid Other | Attending: Obstetrics and Gynecology | Admitting: Obstetrics and Gynecology

## 2020-10-09 DIAGNOSIS — O133 Gestational [pregnancy-induced] hypertension without significant proteinuria, third trimester: Secondary | ICD-10-CM | POA: Diagnosis not present

## 2020-10-09 DIAGNOSIS — Z3689 Encounter for other specified antenatal screening: Secondary | ICD-10-CM | POA: Diagnosis not present

## 2020-10-09 DIAGNOSIS — O368130 Decreased fetal movements, third trimester, not applicable or unspecified: Secondary | ICD-10-CM | POA: Insufficient documentation

## 2020-10-09 DIAGNOSIS — J45909 Unspecified asthma, uncomplicated: Secondary | ICD-10-CM | POA: Diagnosis not present

## 2020-10-09 DIAGNOSIS — Z3A32 32 weeks gestation of pregnancy: Secondary | ICD-10-CM | POA: Insufficient documentation

## 2020-10-09 DIAGNOSIS — O99513 Diseases of the respiratory system complicating pregnancy, third trimester: Secondary | ICD-10-CM | POA: Diagnosis not present

## 2020-10-09 DIAGNOSIS — O26893 Other specified pregnancy related conditions, third trimester: Secondary | ICD-10-CM | POA: Diagnosis present

## 2020-10-09 DIAGNOSIS — Z79899 Other long term (current) drug therapy: Secondary | ICD-10-CM | POA: Insufficient documentation

## 2020-10-09 DIAGNOSIS — Z791 Long term (current) use of non-steroidal anti-inflammatories (NSAID): Secondary | ICD-10-CM | POA: Insufficient documentation

## 2020-10-09 DIAGNOSIS — O09293 Supervision of pregnancy with other poor reproductive or obstetric history, third trimester: Secondary | ICD-10-CM | POA: Diagnosis not present

## 2020-10-09 DIAGNOSIS — R102 Pelvic and perineal pain: Secondary | ICD-10-CM | POA: Diagnosis not present

## 2020-10-09 DIAGNOSIS — O4703 False labor before 37 completed weeks of gestation, third trimester: Secondary | ICD-10-CM | POA: Diagnosis not present

## 2020-10-09 DIAGNOSIS — B009 Herpesviral infection, unspecified: Secondary | ICD-10-CM

## 2020-10-09 DIAGNOSIS — O98513 Other viral diseases complicating pregnancy, third trimester: Secondary | ICD-10-CM

## 2020-10-09 LAB — URINALYSIS, ROUTINE W REFLEX MICROSCOPIC
Bilirubin Urine: NEGATIVE
Glucose, UA: NEGATIVE mg/dL
Hgb urine dipstick: NEGATIVE
Ketones, ur: NEGATIVE mg/dL
Leukocytes,Ua: NEGATIVE
Nitrite: NEGATIVE
Protein, ur: NEGATIVE mg/dL
Specific Gravity, Urine: 1.011 (ref 1.005–1.030)
pH: 7 (ref 5.0–8.0)

## 2020-10-09 LAB — WET PREP, GENITAL
Clue Cells Wet Prep HPF POC: NONE SEEN
Sperm: NONE SEEN
Trich, Wet Prep: NONE SEEN
Yeast Wet Prep HPF POC: NONE SEEN

## 2020-10-09 MED ORDER — CALCIUM CARBONATE ANTACID 500 MG PO CHEW
2.0000 | CHEWABLE_TABLET | Freq: Once | ORAL | Status: AC
  Administered 2020-10-09: 400 mg via ORAL
  Filled 2020-10-09: qty 2

## 2020-10-09 MED ORDER — LACTATED RINGERS IV BOLUS
1000.0000 mL | Freq: Once | INTRAVENOUS | Status: AC
  Administered 2020-10-09: 1000 mL via INTRAVENOUS

## 2020-10-09 MED ORDER — BETAMETHASONE SOD PHOS & ACET 6 (3-3) MG/ML IJ SUSP
12.0000 mg | Freq: Once | INTRAMUSCULAR | Status: DC
  Filled 2020-10-09: qty 5

## 2020-10-09 NOTE — MAU Provider Note (Signed)
History     CSN: [id number]  Arrival date and time: 10/09/20 0731   Event Date/Time   First Provider Initiated Contact with Patient 10/09/20 [phone]      Chief Complaint  Patient presents with  . Abdominal Pain  . Vaginal Discharge  . Decreased Fetal Movement   HPI This is a 30yo [id number] at [redacted]w[redacted]d with pregnancy complicated by GHTN, HSV, Asthma. She presents with pelvic pain that started this morning. Had 3 severe contractions between 5-6am that were worse than labor contractions. No further contractions, but feels crampy. Now having pelvic pain without radiation. Worse with walking. Improved with rest. No h/o preterm labor. Initially, had decreased fetal movement, but now baby is moving.   Also having greenish discharge. No recent sex, leaking fluid, vaginal bleeding.   Past Medical History:  Diagnosis Date  . Anemia   . Asthma   . Fibroid   . Genital herpes simplex 02/27/2018  . Gestational HTN 04/22/2018  . Gestational hypertension 04/22/2018  . H/O unilateral salpingectomy 04/04/2017  . Ovarian tumor (benign), left   . Pregnancy induced hypertension   . Pregnant 03/27/2017  . SAB (spontaneous abortion) 04/04/2017    Past Surgical History:  Procedure Laterality Date  . ELBOW SURGERY    . LEFT OOPHORECTOMY  2013    Family History  Problem Relation Age of Onset  . Diabetes Father   . Aneurysm Mother   . Diabetes Mother   . Stroke Mother   . Diabetes Paternal Grandmother     Social History   Tobacco Use  . Smoking status: Never Smoker  . Smokeless tobacco: Never Used  Vaping Use  . Vaping Use: Never used  Substance Use Topics  . Alcohol use: No  . Drug use: No    Allergies:  Allergies  Allergen Reactions  . Contrast Media [Iodinated Diagnostic Agents] Itching and Other (See Comments)    Itching on tongue    Medications Prior to Admission  Medication Sig Dispense Refill Last Dose  . calcium carbonate (TUMS - DOSED IN MG ELEMENTAL CALCIUM) 500 MG chewable  tablet Chew 1 tablet by mouth 5 (five) times daily as needed for indigestion or heartburn.   10/08/2020 at Unknown time  . Fe Fum-Fe Poly-Vit C-Lactobac (FUSION) 65-65-25-30 MG CAPS Take 1 capsule by mouth daily.   10/08/2020 at Unknown time  . albuterol (VENTOLIN HFA) 108 (90 Base) MCG/ACT inhaler Inhale into the lungs every 4 (four) hours as needed for wheezing or shortness of breath.     . ferrous sulfate 325 (65 FE) MG tablet Take 1 tablet (325 mg total) by mouth daily with breakfast. 30 tablet 2   . ibuprofen (ADVIL) 600 MG tablet Take 1 tablet (600 mg total) by mouth every 6 (six) hours. 30 tablet 0   . NIFEdipine (ADALAT CC) 30 MG 24 hr tablet Take 1 tablet (30 mg total) by mouth daily. 30 tablet 2     Review of Systems Physical Exam   Blood pressure 119/72, pulse 98, temperature 98.2 F (36.8 C), temperature source Oral, resp. rate 16, weight 97.6 kg, SpO2 100 %, unknown if currently breastfeeding.  Physical Exam Vitals and nursing note reviewed. Exam conducted with a chaperone present.  Constitutional:      Appearance: She is well-developed.  HENT:     Head: Normocephalic and atraumatic.  Cardiovascular:     Rate and Rhythm: Normal rate and regular rhythm.     Heart sounds: Normal heart sounds. No murmur heard. No  friction rub.  Pulmonary:     Effort: Pulmonary effort is normal.     Breath sounds: Normal breath sounds.  Abdominal:     Palpations: Abdomen is soft.  Skin:    General: Skin is warm and dry.  Neurological:     Mental Status: She is alert.   Dilation: 1.5 Effacement (%): 40 Cervical Position: Middle Station: -3 Presentation: Vertex Exam by:: Stinson, DO   MAU Course  Procedures NST: Baseline 140, mod variability, + accels, no decels No contractions  MDM No cervical change. 1 L IVF given.  Assessment and Plan     ICD-10-CM   1. Threatened premature labor in third trimester  O47.03   2. [redacted] weeks gestation of pregnancy  Z3A.32   3. NST  (non-stress test) reactive  Z36.89    No change in cervix. Precautions given. Patient declined BMZ. Has follow up in 2 days with primary OB.  Truett Mainland 10/09/2020, 8:37 AM

## 2020-10-09 NOTE — MAU Note (Signed)
Anna Burns is a 30 y.o. at [redacted]w[redacted]d here in MAU reporting: between 0400 and 0500 had 3 painful contractions. Went to the bathroom and saw some green and brown discharge. Now is having a lot of pain when walking, feels like a baby is going to come. No LOF. DFM today.  Onset of complaint: today  Pain score: 5/10  Vitals:   10/09/20 0748  BP: 119/72  Pulse: 98  Resp: 16  Temp: 98.2 F (36.8 C)  SpO2: 100%     FHT: 140  Lab orders placed from triage: UA

## 2020-10-10 LAB — GC/CHLAMYDIA PROBE AMP (~~LOC~~) NOT AT ARMC
Chlamydia: NEGATIVE
Comment: NEGATIVE
Comment: NORMAL
Neisseria Gonorrhea: NEGATIVE

## 2020-10-16 ENCOUNTER — Inpatient Hospital Stay (HOSPITAL_COMMUNITY)
Admission: AD | Admit: 2020-10-16 | Discharge: 2020-10-16 | Disposition: A | Discharge status: Home / Self Care | Payer: Medicaid Other | Attending: Obstetrics & Gynecology | Admitting: Obstetrics & Gynecology

## 2020-10-16 ENCOUNTER — Encounter (HOSPITAL_COMMUNITY): Payer: Self-pay | Admitting: Obstetrics & Gynecology

## 2020-10-16 ENCOUNTER — Other Ambulatory Visit: Payer: Self-pay

## 2020-10-16 DIAGNOSIS — O4703 False labor before 37 completed weeks of gestation, third trimester: Secondary | ICD-10-CM | POA: Insufficient documentation

## 2020-10-16 DIAGNOSIS — O479 False labor, unspecified: Secondary | ICD-10-CM

## 2020-10-16 DIAGNOSIS — Z3A33 33 weeks gestation of pregnancy: Secondary | ICD-10-CM

## 2020-10-16 DIAGNOSIS — Z3689 Encounter for other specified antenatal screening: Secondary | ICD-10-CM

## 2020-10-16 MED ORDER — CALCIUM CARBONATE ANTACID 500 MG PO CHEW
2.0000 | CHEWABLE_TABLET | Freq: Once | ORAL | Status: AC
  Administered 2020-10-16: 400 mg via ORAL
  Filled 2020-10-16: qty 2

## 2020-10-16 MED ORDER — FAMOTIDINE 20 MG PO TABS: 20.0000 mg | ORAL_TABLET | Freq: Two times a day (BID) | ORAL | 2 refills | Status: DC

## 2020-10-16 NOTE — Discharge Instructions (Signed)

## 2020-10-16 NOTE — MAU Note (Signed)
PT SAYS  SHE WAS HERE LAT FOR UC AND DILATED- WENT HOME AFTER FLUIDS.  TONIGHT - AT 8 -PRESSURE IN PERINEUM, ABD TIGHT - UNSURE OF FLUID THAT CAME OUT -CALLED DR PINN- NOW CONTINUES CRAMPING Q15 MIN

## 2020-10-17 NOTE — MAU Provider Note (Signed)
Event Date/Time   First Provider Initiated Contact with Patient 10/16/20 2252     S: Ms. Anjolie Majer is a 30 y.o. [phone] at [redacted]w[redacted]d  who presents to MAU today complaining of a few irregular contractions with "odd" pelvic pressure, stated "it felt like she was going to fall out or if I stood up, my water would break. She denies vaginal bleeding. She denies LOF. She reports normal fetal movement.    O: BP 120/68   Pulse 98   Temp 98.3 F (36.8 C) (Oral)   Resp 20   Ht 5' 3.5" (1.613 m)   Wt 220 lb 12.8 oz (100.2 kg)   BMI 38.50 kg/m  GENERAL: Well-developed, well-nourished female in no acute distress.  HEAD: Normocephalic, atraumatic.  CHEST: Normal effort of breathing, regular heart rate ABDOMEN: Soft, nontender, gravid  Cervical exam (unchanged from previous exam):  Dilation: 1.5 Effacement (%): 40 Cervical Position: Posterior Station: Ballotable Presentation: Undeterminable Exam by:: Candie Chroman, CNM  Bedside ultrasound performed for verification of presentation, baby remains breech with head to maternal right  Fetal Monitoring: reactive Baseline: 135 Variability: moderate Accelerations: 15x15 Decelerations: none Contractions: none  A: SIUP at [redacted]w[redacted]d  False labor  P: Discharge home in stable condition with preterm labor precautions Follow up at [place] as scheduled for ongoing prenatal care  Gabriel Carina, CNM 10/17/2020 12:10 AM

## 2020-10-24 ENCOUNTER — Other Ambulatory Visit: Payer: Self-pay | Admitting: Obstetrics & Gynecology

## 2020-10-27 ENCOUNTER — Telehealth (HOSPITAL_COMMUNITY): Payer: Self-pay | Admitting: *Deleted

## 2020-10-27 NOTE — Patient Instructions (Signed)
Anna Burns  10/27/2020   Your procedure is scheduled on:  11/10/2020  Arrive at 1:30 PM at Entrance C on [address] at [facility]  and [address]. You are invited to use the FREE valet parking or use the Visitor's parking deck.  Pick up the phone at the desk and dial [phone].  Call this number if you have problems the morning of surgery: [phone]  Remember:   Do not eat food:(After Midnight) Desps de medianoche.  Do not drink clear liquids: (After Midnight) Desps de medianoche.  Take these medicines the morning of surgery with A SIP OF WATER:  Bring your inhaler   Do not wear jewelry, make-up or nail polish.  Do not wear lotions, powders, or perfumes. Do not wear deodorant.  Do not shave 48 hours prior to surgery.  Do not bring valuables to the hospital.  [facility] is not   responsible for any belongings or valuables brought to the hospital.  Contacts, dentures or bridgework may not be worn into surgery.  Leave suitcase in the car. After surgery it may be brought to your room.  For patients admitted to the hospital, checkout time is 11:00 AM the day of              discharge.      Please read over the following fact sheets that you were given:     Preparing for Surgery

## 2020-10-27 NOTE — Telephone Encounter (Signed)
Preadmission screen  

## 2020-10-28 ENCOUNTER — Inpatient Hospital Stay (HOSPITAL_COMMUNITY)
Admission: AD | Admit: 2020-10-28 | Discharge: 2020-10-28 | Disposition: A | Discharge status: Home / Self Care | Payer: Medicaid Other | Attending: Obstetrics & Gynecology | Admitting: Obstetrics & Gynecology

## 2020-10-28 ENCOUNTER — Encounter (HOSPITAL_COMMUNITY): Payer: Self-pay | Admitting: Obstetrics & Gynecology

## 2020-10-28 ENCOUNTER — Other Ambulatory Visit: Payer: Self-pay

## 2020-10-28 DIAGNOSIS — Z79899 Other long term (current) drug therapy: Secondary | ICD-10-CM | POA: Insufficient documentation

## 2020-10-28 DIAGNOSIS — Z3A35 35 weeks gestation of pregnancy: Secondary | ICD-10-CM | POA: Insufficient documentation

## 2020-10-28 DIAGNOSIS — O99513 Diseases of the respiratory system complicating pregnancy, third trimester: Secondary | ICD-10-CM | POA: Insufficient documentation

## 2020-10-28 DIAGNOSIS — O09293 Supervision of pregnancy with other poor reproductive or obstetric history, third trimester: Secondary | ICD-10-CM | POA: Diagnosis present

## 2020-10-28 DIAGNOSIS — O26893 Other specified pregnancy related conditions, third trimester: Secondary | ICD-10-CM | POA: Diagnosis not present

## 2020-10-28 DIAGNOSIS — O99613 Diseases of the digestive system complicating pregnancy, third trimester: Secondary | ICD-10-CM | POA: Diagnosis not present

## 2020-10-28 DIAGNOSIS — R102 Pelvic and perineal pain: Secondary | ICD-10-CM | POA: Diagnosis not present

## 2020-10-28 DIAGNOSIS — K219 Gastro-esophageal reflux disease without esophagitis: Secondary | ICD-10-CM

## 2020-10-28 DIAGNOSIS — J45909 Unspecified asthma, uncomplicated: Secondary | ICD-10-CM | POA: Insufficient documentation

## 2020-10-28 LAB — POCT FERN TEST: POCT Fern Test: NEGATIVE

## 2020-10-28 MED ORDER — PANTOPRAZOLE SODIUM 40 MG PO TBEC: 40.0000 mg | DELAYED_RELEASE_TABLET | Freq: Every day | ORAL | Status: DC

## 2020-10-28 MED ORDER — LIDOCAINE VISCOUS HCL 2 % MT SOLN: 15.0000 mL | Freq: Once | OROMUCOSAL | Status: DC

## 2020-10-28 MED ORDER — ALUM & MAG HYDROXIDE-SIMETH 200-200-20 MG/5ML PO SUSP: 30.0000 mL | Freq: Once | ORAL | Status: DC

## 2020-10-28 MED ORDER — DICYCLOMINE HCL 10 MG/5ML PO SOLN
10.0000 mg | Freq: Once | ORAL | Status: DC
  Filled 2020-10-28: qty 5

## 2020-10-28 NOTE — Discharge Instructions (Signed)
Please return to MAU for worsening contractions, >6 painful contractions in 1 hour, vaginal bleeding like a period, leaking fluid, decreased or no fetal movement.

## 2020-10-28 NOTE — MAU Note (Signed)
Pt reports to mau with reports of pelvic pressure since last night.  Denies reg ctx or LOF. +FM

## 2020-10-28 NOTE — MAU Provider Note (Signed)
History     CSN: [id number]  Arrival date and time: 10/28/20 1010   Event Date/Time   First Provider Initiated Contact with Patient 10/28/20 1143      Chief Complaint  Patient presents with  . Abdominal Pain   [name] is a 30 y.o. year old [id number] female at [redacted]w[redacted]d weeks gestation who presents to MAU reporting increasing pelvic pain/pressure since last night. She reports the pain started out as pressure, but is now pain. She denies UC's or LOF. She also reports that she has "really bad heartburn" and started coughing when she got here and peed on herself. She reports that she was wet, but she is sure it is not her water that is broken. She receives [facility] with [facility]; next appt 10/30/2020.   OB History    Gravida  7   Para  3   Term  3   Preterm  0   AB  3   Living  3     SAB  2   IAB  1   Ectopic      Multiple  0   Live Births  3        Obstetric Comments  Induced due to HTN, PP hemorrhage w/2019 delivery        Past Medical History:  Diagnosis Date  . Anemia   . Asthma   . Fibroid   . Genital herpes simplex 02/27/2018  . Gestational HTN 04/22/2018  . Gestational hypertension 04/22/2018  . H/O unilateral salpingectomy 04/04/2017  . Ovarian tumor (benign), left   . Pregnancy induced hypertension   . Pregnant 03/27/2017  . SAB (spontaneous abortion) 04/04/2017    Past Surgical History:  Procedure Laterality Date  . ELBOW SURGERY    . LEFT OOPHORECTOMY  2013    Family History  Problem Relation Age of Onset  . Diabetes Father   . Aneurysm Mother   . Diabetes Mother   . Stroke Mother   . Diabetes Paternal Grandmother     Social History   Tobacco Use  . Smoking status: Never Smoker  . Smokeless tobacco: Never Used  Vaping Use  . Vaping Use: Never used  Substance Use Topics  . Alcohol use: No  . Drug use: No    Allergies:  Allergies  Allergen Reactions  . Contrast Media [Iodinated Diagnostic Agents] Itching and Other (See  Comments)    Itching on tongue    Medications Prior to Admission  Medication Sig Dispense Refill Last Dose  . calcium carbonate (TUMS - DOSED IN MG ELEMENTAL CALCIUM) 500 MG chewable tablet Chew 1 tablet by mouth 5 (five) times daily as needed for indigestion or heartburn.   10/27/2020 at Unknown time  . NIFEdipine (PROCARDIA) 10 MG capsule Take 10 mg by mouth 3 (three) times daily. Patient does not recall dosage. Patient stated that she takes med when she is having contractions up to 4x a day ]     . albuterol (VENTOLIN HFA) 108 (90 Base) MCG/ACT inhaler Inhale into the lungs every 4 (four) hours as needed for wheezing or shortness of breath.   Unknown at Unknown time  . famotidine (PEPCID) 20 MG tablet Take 1 tablet (20 mg total) by mouth 2 (two) times daily. 60 tablet 2   . Fe Fum-Fe Poly-Vit C-Lactobac (FUSION) 65-65-25-30 MG CAPS Take 1 capsule by mouth daily.     . ferrous sulfate 325 (65 FE) MG tablet Take 1 tablet (325 mg total) by mouth daily  with breakfast. 30 tablet 2     Review of Systems  Constitutional: Negative.   HENT: Negative.   Eyes: Negative.   Respiratory: Negative.   Gastrointestinal:       "bad heartburn"  Endocrine: Negative.   Genitourinary: Positive for pelvic pain (increased pelvic pressure since last night).  Allergic/Immunologic: Negative.   Neurological: Negative.   Hematological: Negative.   Psychiatric/Behavioral: Negative.    Physical Exam   Blood pressure 132/88, pulse (!) 108, temperature 98.1 F (36.7 C), temperature source Oral, resp. rate 16, SpO2 99 %, unknown if currently breastfeeding.  Physical Exam Vitals and nursing note reviewed. Exam conducted with a chaperone present.  Constitutional:      Appearance: Normal appearance. She is obese.  Cardiovascular:     Rate and Rhythm: Tachycardia present.  Abdominal:     Palpations: Abdomen is soft.  Genitourinary:    Comments: Dilation: 2 Effacement (%): 50 Cervical Position:  Posterior Station: Ballotable Presentation:  (breech per patient) Exam by:: Sunday Corn, CNM Skin:    General: Skin is warm and dry.  Neurological:     Mental Status: She is alert and oriented to person, place, and time.  Psychiatric:        Mood and Affect: Mood normal.        Behavior: Behavior normal.        Thought Content: Thought content normal.    REACTIVE NST - FHR: 145 bpm / moderate variability / accels present / decels absent / TOCO: irregular every 2-4 mins   Reassessment @ 1300: cervix unchanged MAU Course  Procedures  MDM Maryann Alar Slide  Results for orders placed or performed during the hospital encounter of 10/28/20 (from the past 24 hour(s))  Fern Test     Status: Normal   Collection Time: 10/28/20 11:57 AM  Result Value Ref Range   POCT Fern Test Negative = intact amniotic membranes     Assessment and Plan  Pelvic pressure in pregnancy, antepartum, third trimester  - Reassurance given that cervix is unchanged and she is not in labor - Information provided on abdominal pain and PTL  - Return to MAU for > 6 painful contraction per hour, VB like a period, LOF, decreased or no FM - Discharge patient - Keep scheduled appt with [facility] on 10/30/20 - Patient verbalized an understanding of the plan of care and agrees.   Laury Deep, CNM 10/28/2020, 11:43 AM

## 2020-10-30 ENCOUNTER — Other Ambulatory Visit: Payer: Self-pay | Admitting: Obstetrics & Gynecology

## 2020-10-30 ENCOUNTER — Telehealth (HOSPITAL_COMMUNITY): Payer: Self-pay | Admitting: *Deleted

## 2020-10-30 NOTE — Telephone Encounter (Signed)
Preadmission screen  

## 2020-10-31 ENCOUNTER — Other Ambulatory Visit: Payer: Self-pay

## 2020-10-31 ENCOUNTER — Inpatient Hospital Stay (HOSPITAL_COMMUNITY)
Admission: AD | Admit: 2020-10-31 | Discharge: 2020-10-31 | Disposition: A | Discharge status: Home / Self Care | Payer: Medicaid Other | Attending: Obstetrics and Gynecology | Admitting: Obstetrics and Gynecology

## 2020-10-31 ENCOUNTER — Telehealth (HOSPITAL_COMMUNITY): Payer: Self-pay | Admitting: *Deleted

## 2020-10-31 ENCOUNTER — Encounter (HOSPITAL_COMMUNITY): Payer: Self-pay

## 2020-10-31 DIAGNOSIS — Z3689 Encounter for other specified antenatal screening: Secondary | ICD-10-CM

## 2020-10-31 DIAGNOSIS — O4703 False labor before 37 completed weeks of gestation, third trimester: Secondary | ICD-10-CM | POA: Insufficient documentation

## 2020-10-31 DIAGNOSIS — Z3A35 35 weeks gestation of pregnancy: Secondary | ICD-10-CM

## 2020-10-31 DIAGNOSIS — Z3493 Encounter for supervision of normal pregnancy, unspecified, third trimester: Secondary | ICD-10-CM

## 2020-10-31 DIAGNOSIS — R110 Nausea: Secondary | ICD-10-CM

## 2020-10-31 MED ORDER — ONDANSETRON HCL 4 MG PO TABS
4.0000 mg | ORAL_TABLET | Freq: Once | ORAL | Status: AC
  Administered 2020-10-31: 4 mg via ORAL
  Filled 2020-10-31: qty 1

## 2020-10-31 NOTE — Telephone Encounter (Signed)
Preadmission screen  

## 2020-10-31 NOTE — MAU Note (Signed)
Pt reports Ctx started 0400, reports losing mucus plug yesterday, denies LOF. Reports bright, red vag bleeding and no VB since. Reports +FM.

## 2020-10-31 NOTE — Discharge Instructions (Signed)

## 2020-10-31 NOTE — MAU Provider Note (Signed)
Event Date/Time   First Provider Initiated Contact with Patient 10/31/20 [phone]     S: [name] is a 30 y.o. [id number] at [redacted]w[redacted]d  who presents to MAU today complaining contractions q4 minutes since 0400. She endorses vaginal bleeding (bloody show). She denies LOF. She reports normal fetal movement. Baby has been breech at recent appointments, she is scheduled for a CS at 39wks. Has felt significant movement overnight and has increased vaginal pressure now. Contractions last 30-45s.  O: BP 130/75   Pulse (!) 102   Temp 97.9 F (36.6 C) (Oral)   Resp 18   SpO2 100%  GENERAL: Well-developed, well-nourished female in no acute distress.  HEAD: Normocephalic, atraumatic.  CHEST: Normal effort of breathing, regular heart rate ABDOMEN: Soft, nontender, gravid  Cervical exam: (cervix feels soft and closed externally but is 3 internally, does not feel like a laboring cervix) Dilation: 3 Effacement (%): Thick Station: -3 Presentation: Vertex Exam by:: Candie Chroman, CNM No blood noted with either cervical exam  Fetal positioning confirmed by bedside ultrasound, baby is now vertex.  Fetal Monitoring: reactive Baseline: 130 Variability: moderate Accelerations: 15x15 Decelerations: none Contractions: mild, q2-74min  A: SIUP at [redacted]w[redacted]d  False labor  P: Discharge home with preterm labor precautions  - explained role of prodromal labor in facilitating fetal positioning Follow up at [place] as scheduled for ongoing prenatal care  Gabriel Carina, CNM 10/31/2020 6:21 AM

## 2020-11-01 ENCOUNTER — Encounter (HOSPITAL_COMMUNITY): Payer: Self-pay | Admitting: *Deleted

## 2020-11-01 ENCOUNTER — Telehealth (HOSPITAL_COMMUNITY): Payer: Self-pay | Admitting: *Deleted

## 2020-11-01 NOTE — Telephone Encounter (Signed)
Preadmission screen  

## 2020-11-07 ENCOUNTER — Encounter (HOSPITAL_COMMUNITY): Payer: Self-pay | Admitting: Anesthesiology

## 2020-11-07 ENCOUNTER — Encounter (HOSPITAL_COMMUNITY): Payer: Self-pay | Admitting: Obstetrics & Gynecology

## 2020-11-07 ENCOUNTER — Inpatient Hospital Stay (HOSPITAL_COMMUNITY)
Admission: AD | Admit: 2020-11-07 | Discharge: 2020-11-12 | DRG: 807 | Disposition: A | Discharge status: Home / Self Care | Payer: Medicaid Other | Attending: Obstetrics & Gynecology | Admitting: Obstetrics & Gynecology

## 2020-11-07 ENCOUNTER — Other Ambulatory Visit: Payer: Self-pay

## 2020-11-07 DIAGNOSIS — Z3A37 37 weeks gestation of pregnancy: Secondary | ICD-10-CM | POA: Diagnosis not present

## 2020-11-07 DIAGNOSIS — Z20822 Contact with and (suspected) exposure to covid-19: Secondary | ICD-10-CM | POA: Diagnosis present

## 2020-11-07 DIAGNOSIS — O134 Gestational [pregnancy-induced] hypertension without significant proteinuria, complicating childbirth: Principal | ICD-10-CM | POA: Diagnosis present

## 2020-11-07 DIAGNOSIS — O9902 Anemia complicating childbirth: Secondary | ICD-10-CM | POA: Diagnosis present

## 2020-11-07 DIAGNOSIS — O320XX0 Maternal care for unstable lie, not applicable or unspecified: Secondary | ICD-10-CM | POA: Diagnosis not present

## 2020-11-07 DIAGNOSIS — Z3A36 36 weeks gestation of pregnancy: Secondary | ICD-10-CM | POA: Diagnosis not present

## 2020-11-07 DIAGNOSIS — D509 Iron deficiency anemia, unspecified: Secondary | ICD-10-CM | POA: Diagnosis present

## 2020-11-07 DIAGNOSIS — Z3689 Encounter for other specified antenatal screening: Secondary | ICD-10-CM | POA: Diagnosis not present

## 2020-11-07 LAB — CBC
HCT: 29.7 % — ABNORMAL LOW (ref 36.0–46.0)
Hemoglobin: 9.1 g/dL — ABNORMAL LOW (ref 12.0–15.0)
MCH: 22.6 pg — ABNORMAL LOW (ref 26.0–34.0)
MCHC: 30.6 g/dL (ref 30.0–36.0)
MCV: 73.9 fL — ABNORMAL LOW (ref 80.0–100.0)
Platelets: 242 10*3/uL (ref 150–400)
RBC: 4.02 MIL/uL (ref 3.87–5.11)
RDW: 15.8 % — ABNORMAL HIGH (ref 11.5–15.5)
WBC: 10 10*3/uL (ref 4.0–10.5)
nRBC: 0 % (ref 0.0–0.2)

## 2020-11-07 LAB — RESP PANEL BY RT-PCR (FLU A&B, COVID) ARPGX2
Influenza A by PCR: NEGATIVE
Influenza B by PCR: NEGATIVE
SARS Coronavirus 2 by RT PCR: NEGATIVE

## 2020-11-07 LAB — RAPID HIV SCREEN (HIV 1/2 AB+AG)
HIV 1/2 Antibodies: NONREACTIVE
HIV-1 P24 Antigen - HIV24: NONREACTIVE

## 2020-11-07 LAB — TYPE AND SCREEN
ABO/RH(D): B POS
Antibody Screen: NEGATIVE

## 2020-11-07 MED ORDER — ONDANSETRON HCL 4 MG/2ML IJ SOLN
4.0000 mg | Freq: Four times a day (QID) | INTRAMUSCULAR | Status: DC | PRN
  Administered 2020-11-09: 4 mg via INTRAVENOUS
  Filled 2020-11-07: qty 2

## 2020-11-07 MED ORDER — DIPHENHYDRAMINE HCL 50 MG/ML IJ SOLN: 12.5000 mg | INTRAMUSCULAR | Status: DC | PRN

## 2020-11-07 MED ORDER — LACTATED RINGERS IV SOLN: 500.0000 mL | INTRAVENOUS | Status: DC | PRN

## 2020-11-07 MED ORDER — PHENYLEPHRINE 40 MCG/ML (10ML) SYRINGE FOR IV PUSH (FOR BLOOD PRESSURE SUPPORT): 80.0000 ug | PREFILLED_SYRINGE | INTRAVENOUS | Status: DC | PRN

## 2020-11-07 MED ORDER — OXYCODONE-ACETAMINOPHEN 5-325 MG PO TABS: 1.0000 | ORAL_TABLET | ORAL | Status: DC | PRN

## 2020-11-07 MED ORDER — ACETAMINOPHEN 325 MG PO TABS
650.0000 mg | ORAL_TABLET | ORAL | Status: DC | PRN
  Administered 2020-11-08: 650 mg via ORAL
  Filled 2020-11-07: qty 2

## 2020-11-07 MED ORDER — LIDOCAINE HCL (PF) 1 % IJ SOLN: 30.0000 mL | INTRAMUSCULAR | Status: DC | PRN

## 2020-11-07 MED ORDER — LACTATED RINGERS IV SOLN: INTRAVENOUS | Status: DC

## 2020-11-07 MED ORDER — EPHEDRINE 5 MG/ML INJ: 10.0000 mg | INTRAVENOUS | Status: DC | PRN

## 2020-11-07 MED ORDER — LACTATED RINGERS IV SOLN: 500.0000 mL | Freq: Once | INTRAVENOUS | Status: DC

## 2020-11-07 MED ORDER — FENTANYL-BUPIVACAINE-NACL 0.5-0.125-0.9 MG/250ML-% EP SOLN: 12.0000 mL/h | EPIDURAL | Status: DC | PRN

## 2020-11-07 MED ORDER — SOD CITRATE-CITRIC ACID 500-334 MG/5ML PO SOLN
30.0000 mL | ORAL | Status: DC | PRN
  Administered 2020-11-08: 30 mL via ORAL
  Filled 2020-11-07 (×2): qty 15

## 2020-11-07 MED ORDER — CALCIUM CARBONATE ANTACID 500 MG PO CHEW
1.0000 | CHEWABLE_TABLET | Freq: Three times a day (TID) | ORAL | Status: DC
  Administered 2020-11-07 – 2020-11-10 (×8): 200 mg via ORAL
  Filled 2020-11-07 (×10): qty 1

## 2020-11-07 MED ORDER — OXYTOCIN BOLUS FROM INFUSION: 333.0000 mL | Freq: Once | INTRAVENOUS | Status: DC

## 2020-11-07 MED ORDER — OXYTOCIN-SODIUM CHLORIDE 30-0.9 UT/500ML-% IV SOLN: 2.5000 [IU]/h | INTRAVENOUS | Status: DC

## 2020-11-07 MED ORDER — OXYCODONE-ACETAMINOPHEN 5-325 MG PO TABS: 2.0000 | ORAL_TABLET | ORAL | Status: DC | PRN

## 2020-11-07 NOTE — MAU Note (Signed)
Sent from office.  Had changed from 3 to 4+. Pt states felt like labor last night, but not now.   Feels pressure, some pain in back.  Denies bleeding, ? Leaking all day, did not mention to dr, not wearing a pad. +FM

## 2020-11-07 NOTE — H&P (Addendum)
OB ADMISSION/ HISTORY & PHYSICAL:  Admission Date: 11/07/2020  2:31 PM  Admit Diagnosis: Preterm labor  Anna Burns is a 30 y.o. female [id number] [redacted]w[redacted]d presenting for advanced dilatation and ctxs . Endorses active FM, reports leaking of fluid today. Denies vaginal bleeding.  Ctx began @ 11/06/20. Was seen in the office today by Dr Alwyn Pea. SVE 4-5/80/0.  History of current pregnancy: [id number]   Primary OB Provider: [facility] Patient entered care with [facility] at 6.1 wks.   EDC 12/01/20 by 6.1wk U/S Anatomy scan:  23.3 wks, complete w/ posterior placenta.   Antenatal testing: for GHTN started at 32 weeks Last evaluation: 36.4  wks  Significant prenatal events:  Patient Active Problem List   Diagnosis Date Noted  . Postpartum headache 07/14/2019  . Preeclampsia in postpartum period 07/14/2019  . Postpartum anemia 05/22/2019  . SVD (11/5) 05/20/2019  . Headache in pregnancy, antepartum, third trimester 05/18/2019  . Hypertension affecting pregnancy 05/18/2019  . Gestational hypertension 04/22/2018  . Genital herpes simplex 02/27/2018  . Asthma 04/04/2017  . SAB (spontaneous abortion) 04/04/2017  . H/O unilateral salpingectomy 04/04/2017  . Pregnant 03/27/2017    Prenatal Labs: ABO, Rh: B/Positive/-- (10/28 0000) Antibody: Negative (10/28 0000) Rubella: Immune (10/28 0000)  RPR: Nonreactive (10/28 0000) HBsAg: Negative (10/28 0000)  HIV: Non-reactive (10/28 0000)  GTT: 182, passed 3 hour  GBS:   Negative GC/CHL: negative/negative Genetics: LR female Tdap/influenza vaccines: declined   OB History  Gravida Para Term Preterm AB Living  7 3 3  0 3 3  SAB IAB Ectopic Multiple Live Births  2 1   0 3    # Outcome Date GA Lbr Len/2nd Weight Sex Delivery Anes PTL Lv  7 Current           6 Term 05/20/19 [redacted]w[redacted]d 12:19 / 00:20 3323 g M Vag-Spont EPI  LIV     Birth Comments: PIH  5 Term 04/23/18 [redacted]w[redacted]d  2865 g M Vag-Spont EPI  LIV     Birth Comments: PIH     Complications: Other Excessive  Bleeding  4 Term 01/17/12 [redacted]w[redacted]d   F Vag-Spont EPI  LIV  3 SAB           2 SAB           1 IAB             Obstetric Comments  Induced due to HTN, PP hemorrhage w/2019 delivery    Medical / Surgical History: Past medical history:  Past Medical History:  Diagnosis Date  . Anemia   . Asthma   . Fibroid   . Genital herpes simplex 02/27/2018  . Gestational HTN 04/22/2018  . Gestational hypertension 04/22/2018  . H/O unilateral salpingectomy 04/04/2017  . Ovarian tumor (benign), left   . Pregnancy induced hypertension   . Pregnant 03/27/2017  . SAB (spontaneous abortion) 04/04/2017    Past surgical history:  Past Surgical History:  Procedure Laterality Date  . ELBOW SURGERY    . LEFT OOPHORECTOMY  2013   Family History:  Family History  Problem Relation Age of Onset  . Diabetes Father   . Aneurysm Mother   . Diabetes Mother   . Stroke Mother   . Diabetes Paternal Grandmother     Social History:  reports that she has never smoked. She has never used smokeless tobacco. She reports that she does not drink alcohol and does not use drugs.  Allergies: Contrast media [iodinated diagnostic agents]   Current Medications at time  of admission:  Prior to Admission medications   Medication Sig Start Date End Date Taking? Authorizing Provider  albuterol (VENTOLIN HFA) 108 (90 Base) MCG/ACT inhaler Inhale into the lungs every 4 (four) hours as needed for wheezing or shortness of breath.    [provider]  calcium carbonate (TUMS - DOSED IN MG ELEMENTAL CALCIUM) 500 MG chewable tablet Chew 1 tablet by mouth 5 (five) times daily as needed for indigestion or heartburn.    [provider]  famotidine (PEPCID) 20 MG tablet Take 1 tablet (20 mg total) by mouth 2 (two) times daily. 10/16/20   Gabriel Carina, CNM  Fe Fum-Fe Poly-Vit C-Lactobac (FUSION) 65-65-25-30 MG CAPS Take 1 capsule by mouth daily. 03/24/19   [provider]  ferrous sulfate 325 (65 FE) MG tablet Take  1 tablet (325 mg total) by mouth daily with breakfast. 04/25/18   Marikay Alar, CNM  NIFEdipine (PROCARDIA) 10 MG capsule Take 10 mg by mouth 3 (three) times daily. Patient does not recall dosage. Patient stated that she takes med when she is having contractions up to 4x a day ]    [provider]    Review of Systems: Constitutional: Negative   HENT: Negative   Eyes: Negative   Respiratory: Negative   Cardiovascular: Negative   Gastrointestinal: Negative  Genitourinary: negative for bloody show, positive for LOF   Musculoskeletal: Negative   Skin: Negative   Neurological: Negative   Endo/Heme/Allergies: Negative   Psychiatric/Behavioral: Negative    Physical Exam: VS: Blood pressure 135/86, pulse (!) 107, temperature 98.2 F (36.8 C), temperature source Oral, resp. rate 18, height 5' 3.5" (1.613 m), weight 102.2 kg, SpO2 100 %, unknown if currently breastfeeding. AAO x3, no signs of distress Cardiovascular: RRR Respiratory: Lung fields clear to ausculation GU/GI: Abdomen gravid, non-tender, non-distended, active FM, vertex Extremities: negative edema, negative for pain, tenderness, and cords  Cervical exam: 4-5/80/0 per Dr Alwyn Pea in the office today FHR: 145 moderate variability, accelerations present, absent decelerations Toco irregular   Prenatal Transfer Tool  Maternal Diabetes: No Genetic Screening: Normal Maternal Ultrasounds/Referrals: Normal Fetal Ultrasounds or other Referrals:  None Maternal Substance Abuse:  No Significant Maternal Medications:  Valtrex Significant Maternal Lab Results: Group B Strep negative    Assessment: 30 y.o. [id number] [redacted]w[redacted]d admitted for advanced dilatation and ctx.   Early stage of labor GBS Negative Pain management plan: Epidural PRN BS U/S done - vertex  Plan:  Admit to L&D Routine admission orders Epidural PRN  Dr Alwyn Pea notified of admission and plan of care  Domingo Pulse MSN, CNM 11/07/2020 3:24 PM

## 2020-11-07 NOTE — Progress Notes (Signed)
Pt evaluated at bedside by midwife. Cervix has made no change. Orders to continue to monitor pt with minimal interventions until cervix makes change because of gestational age. Order to not insert IV.

## 2020-11-07 NOTE — Progress Notes (Signed)
Per CNM, can remove fetal monitor and toco for pt to ambulate and use the birthing ball.

## 2020-11-07 NOTE — Anesthesia Preprocedure Evaluation (Deleted)
Anesthesia Evaluation    Reviewed: Allergy & Precautions, NPO status , Patient's Chart, lab work & pertinent test results  Airway        Dental   Pulmonary asthma ,           Cardiovascular hypertension (gHtn),      Neuro/Psych  Headaches,    GI/Hepatic negative GI ROS, Neg liver ROS,   Endo/Other  negative endocrine ROS  Renal/GU negative Renal ROS     Musculoskeletal   Abdominal (+) + obese,   Peds  Hematology   Anesthesia Other Findings   Reproductive/Obstetrics (+) Pregnancy                             Anesthesia Physical Anesthesia Plan  ASA: III  Anesthesia Plan: Epidural   Post-op Pain Management:    Induction:   PONV Risk Score and Plan:   Airway Management Planned:   Additional Equipment:   Intra-op Plan:   Post-operative Plan:   Informed Consent: I have reviewed the patients History and Physical, chart, labs and discussed the procedure including the risks, benefits and alternatives for the proposed anesthesia with the patient or authorized representative who has indicated his/her understanding and acceptance.       Plan Discussed with:   Anesthesia Plan Comments: (36.4 wk G7P3 w g Htn for LEA)        Anesthesia Quick Evaluation

## 2020-11-07 NOTE — Progress Notes (Signed)
Subjective:    Breanne is feeling her ctx. Cxts q 1-48min. SVE unchanged from previous exam. CAT 1.    Objective:    VS: BP 122/75   Pulse (!) 138   Temp 98.2 F (36.8 C) (Oral)   Resp 18   Ht 5' 3.5" (1.613 m)   Wt 102.2 kg   SpO2 97%   BMI 39.30 kg/m  FHR : baseline 155 / variability moderate / accelerations present / absent decelerations Toco: contractions every 1-3 minutes  Membranes: intact Dilation: 4.5 Effacement (%): 80 Station: 0 Presentation: Vertex Exam by:: Blima Singer., CNM  Assessment/Plan:   30 y.o. G7P3033 [redacted]w[redacted]d   Labor: observation Preeclampsia:  no signs or symptoms of toxicity Fetal Wellbeing:  Category I Pain Control:  Epidural with labor I/D:  GBS negative Anticipated MOD:  NSVD  BS U/S confirmed vertex Dr Alwyn Pea notified of pt status and POC discussed.   Domingo Pulse MSN, CNM 11/07/2020 9:07 PM

## 2020-11-08 ENCOUNTER — Encounter (HOSPITAL_COMMUNITY)
Admission: RE | Admit: 2020-11-08 | Discharge: 2020-11-08 | Disposition: A | Discharge status: Home / Self Care | Payer: Medicaid Other | Source: Ambulatory Visit | Attending: Obstetrics & Gynecology | Admitting: Obstetrics & Gynecology

## 2020-11-08 ENCOUNTER — Other Ambulatory Visit (HOSPITAL_COMMUNITY): Admission: RE | Admit: 2020-11-08 | Payer: Medicaid Other | Source: Ambulatory Visit

## 2020-11-08 LAB — RPR: RPR Ser Ql: NONREACTIVE

## 2020-11-08 MED ORDER — SODIUM CHLORIDE 0.9% FLUSH
3.0000 mL | Freq: Two times a day (BID) | INTRAVENOUS | Status: DC
  Administered 2020-11-08 – 2020-11-09 (×3): 3 mL via INTRAVENOUS

## 2020-11-08 MED ORDER — FAMOTIDINE IN NACL 20-0.9 MG/50ML-% IV SOLN
20.0000 mg | Freq: Two times a day (BID) | INTRAVENOUS | Status: DC
  Filled 2020-11-08: qty 50

## 2020-11-08 MED ORDER — FAMOTIDINE 20 MG PO TABS
20.0000 mg | ORAL_TABLET | Freq: Two times a day (BID) | ORAL | Status: DC
  Administered 2020-11-08 – 2020-11-10 (×4): 20 mg via ORAL
  Filled 2020-11-08 (×5): qty 1

## 2020-11-08 MED ORDER — DOCUSATE SODIUM 100 MG PO CAPS
100.0000 mg | ORAL_CAPSULE | Freq: Every day | ORAL | Status: DC
  Filled 2020-11-08 (×3): qty 1

## 2020-11-08 MED ORDER — PRENATAL MULTIVITAMIN CH
1.0000 | ORAL_TABLET | Freq: Every day | ORAL | Status: DC
  Administered 2020-11-10: 1 via ORAL
  Filled 2020-11-08 (×2): qty 1

## 2020-11-08 MED ORDER — SODIUM CHLORIDE 0.9 % IV SOLN: 250.0000 mL | INTRAVENOUS | Status: DC | PRN

## 2020-11-08 MED ORDER — ZOLPIDEM TARTRATE 5 MG PO TABS: 5.0000 mg | ORAL_TABLET | Freq: Every evening | ORAL | Status: DC | PRN

## 2020-11-08 MED ORDER — CALCIUM CARBONATE ANTACID 500 MG PO CHEW: 2.0000 | CHEWABLE_TABLET | ORAL | Status: DC | PRN

## 2020-11-08 MED ORDER — ACETAMINOPHEN 325 MG PO TABS
650.0000 mg | ORAL_TABLET | ORAL | Status: DC | PRN
  Administered 2020-11-08 – 2020-11-10 (×3): 650 mg via ORAL
  Filled 2020-11-08 (×3): qty 2

## 2020-11-08 MED ORDER — SODIUM CHLORIDE 0.9% FLUSH: 3.0000 mL | INTRAVENOUS | Status: DC | PRN

## 2020-11-08 MED ORDER — FAMOTIDINE IN NACL 20-0.9 MG/50ML-% IV SOLN
20.0000 mg | Freq: Once | INTRAVENOUS | Status: AC
  Administered 2020-11-08: 20 mg via INTRAVENOUS
  Filled 2020-11-08: qty 50

## 2020-11-08 NOTE — Progress Notes (Addendum)
Hospital day # 1 pregnancy at [redacted]w[redacted]d--Pt admitted on 4/26 for 24 hours obs for cxt with dx of GHTN, was informed by DR Alwyn Pea, that BP controlled with labetalol during pregnancy, pt BP been normotensive during her stay, BP 120/71, asymptomatic.  Mild HA earlier with relief with tylenol. No meds given during this stay. Pt SVE 4.5/80/-3 without change, with bouts of cxt.  Pt has IOL schedule 4/29, DR Pinn speciling, plan to remain in house until delivery, d/t pt feeling uncmofortable, dilation of 4.5, pt lives 30-22mins away. Pt transferred from LD to ante for further observation and admission.   Patient Active Problem List   Diagnosis Date Noted  . Preterm labor in third trimester 11/07/2020  . Postpartum headache 07/14/2019  . Preeclampsia in postpartum period 07/14/2019  . Postpartum anemia 05/22/2019  . SVD (11/5) 05/20/2019  . Headache in pregnancy, antepartum, third trimester 05/18/2019  . Hypertension affecting pregnancy 05/18/2019  . Gestational hypertension 04/22/2018  . Genital herpes simplex 02/27/2018  . Asthma 04/04/2017  . SAB (spontaneous abortion) 04/04/2017  . H/O unilateral salpingectomy 04/04/2017  . Pregnant 03/27/2017     Active Ambulatory Problems    Diagnosis Date Noted  . Asthma 04/04/2017  . SAB (spontaneous abortion) 04/04/2017  . H/O unilateral salpingectomy 04/04/2017  . Gestational hypertension 04/22/2018  . Genital herpes simplex 02/27/2018  . Pregnant 03/27/2017  . Headache in pregnancy, antepartum, third trimester 05/18/2019  . Hypertension affecting pregnancy 05/18/2019  . SVD (11/5) 05/20/2019  . Postpartum anemia 05/22/2019  . Postpartum headache 07/14/2019  . Preeclampsia in postpartum period 07/14/2019   Resolved Ambulatory Problems    Diagnosis Date Noted  . Gestational HTN 04/22/2018   Past Medical History:  Diagnosis Date  . Anemia   . Fibroid   . Ovarian tumor (benign), left   . Pregnancy induced hypertension    S:  Pt stable, resting  in bed, denies feeling cxt at this time, does have intermittent cxt with no cervical change, pt expressing anxiety about going home and going into labor. Pt c/o early about a mild HA that is improving with tylenol.        Perception of contractions: none, OCC and irregular, non now.       Vaginal bleeding: none now       Vaginal discharge:  no significant change  O: BP 120/71 (BP Location: Left Arm)   Pulse (!) 108   Temp 98.8 F (37.1 C) (Oral)   Resp 18   Ht 5' 3.5" (1.613 m)   Wt 102.2 kg   SpO2 100%   BMI 39.30 kg/m       Fetal tracings: baseline 150s +acels, -decel, reactive, Cat 1      Contractions:   Irregular Q3-15, occ.       Uterus non-tender      Extremities: no significant edema and no signs of DVT  A: [redacted]w[redacted]d with Hospital day # 1 pregnancy at [redacted]w[redacted]d--Pt admitted on 4/26 for 24 hours obs for cxt with dx of GHTN, was informed by DR Alwyn Pea, that BP controlled with labetalol during pregnancy, pt BP been normotensive during her stay, BP 120/71, asymptomatic. Mild HA earlier with relief with tylenol. No meds given during this stay. Pt SVE 4.5/80/-3 without change, with bouts of cxt.  Pt has IOL schedule 4/29, DR Pinn speciling, plan to remain in house until delivery, d/t pt feeling uncmofortable, dilation of 4.5, pt lives 30-28mins away. NST reactive.  Pt transferred from LD to ante for further  observation and admission.   P: Continue current plan of care GHTN: montior BP.  Ante orders Regular diet SVD check if pt feels regular cxt pattern IOL set for 4/29 NST Q shift DR Alwyn Pea to follow.  DR Alwyn Pea aware and spoke with pt today about the plan.           [facility] CNM, MSN 11/08/2020 3:40 PM

## 2020-11-09 ENCOUNTER — Inpatient Hospital Stay (HOSPITAL_BASED_OUTPATIENT_CLINIC_OR_DEPARTMENT_OTHER): Payer: Medicaid Other

## 2020-11-09 IMAGING — US US MFM OB LIMITED
1 series · 15 of 26 positions shown · non-contrast
Comparison: none

[Series 1: us mfm ob limited · 15 of 26 slices shown]
[im 1/26]
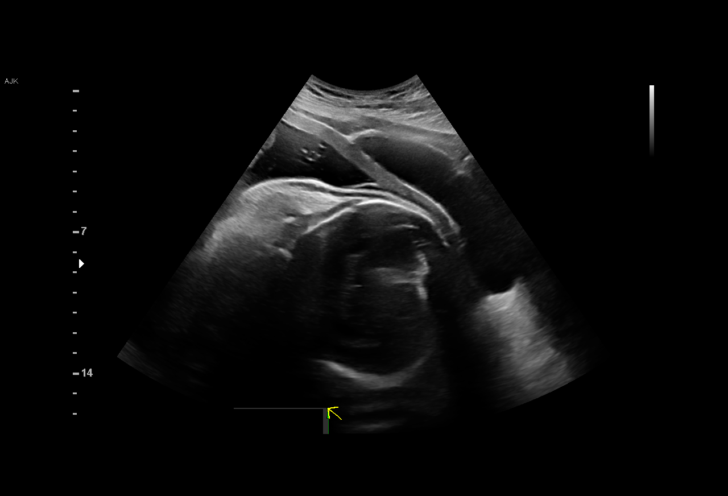
[im 3/26]
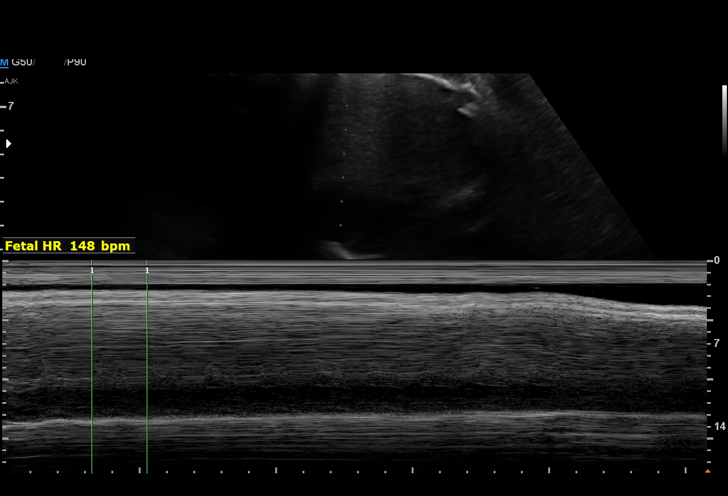
[im 5/26]
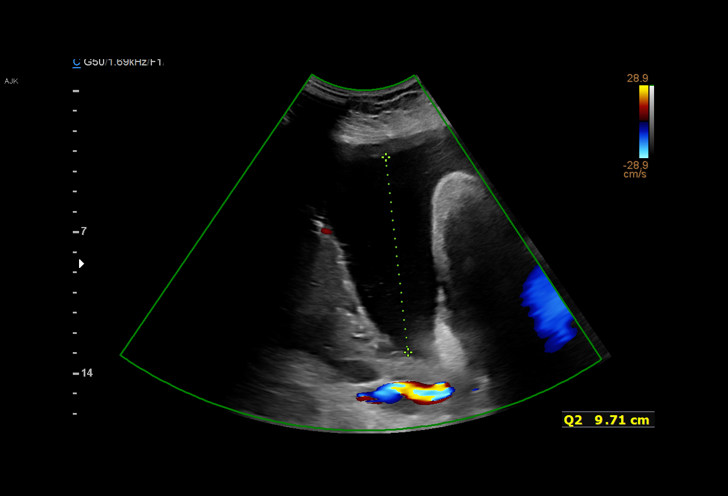
[im 7/26]
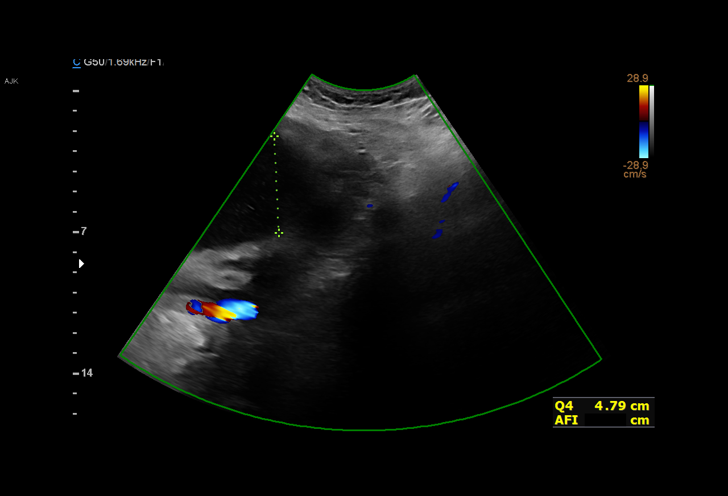
[im 8/26]
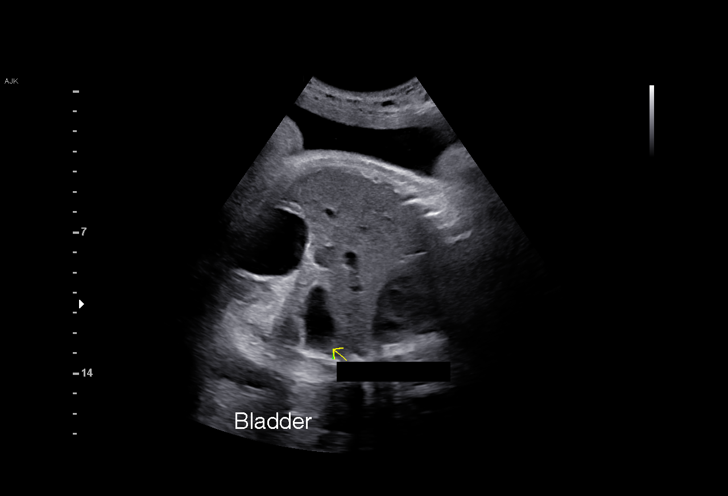
[im 10/26]
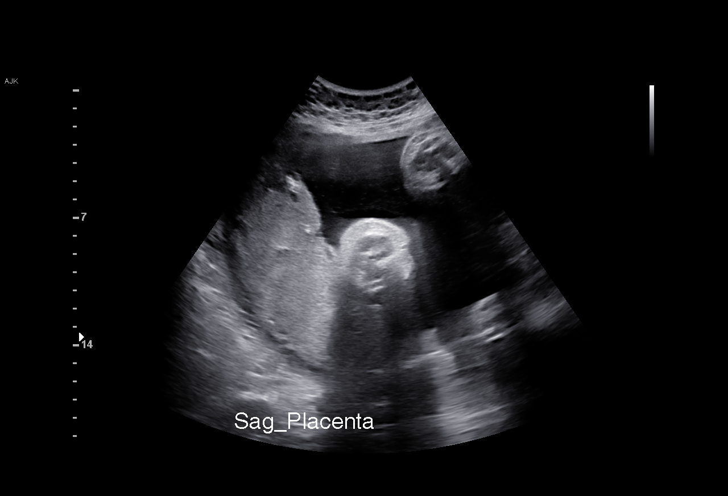
[im 12/26]
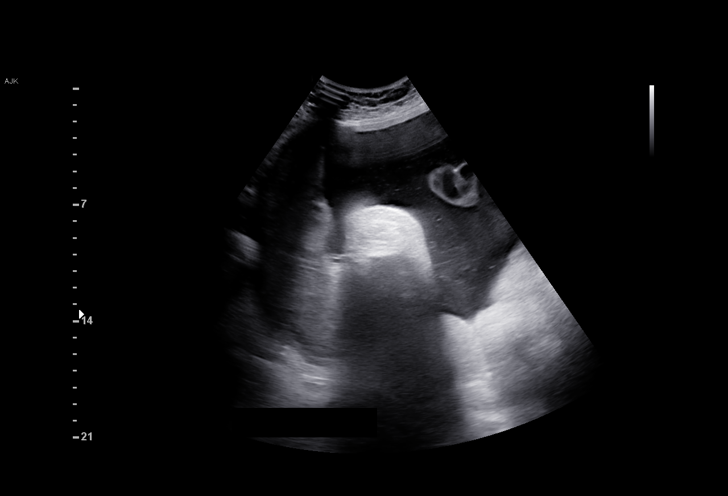
[im 14/26]
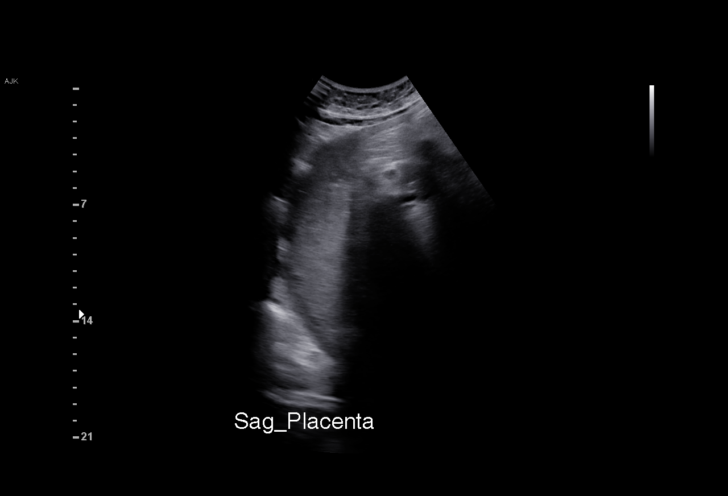
[im 15/26]
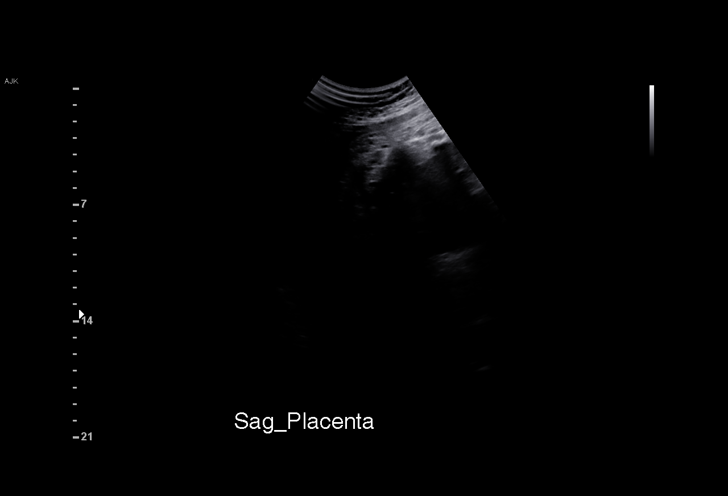
[im 17/26]
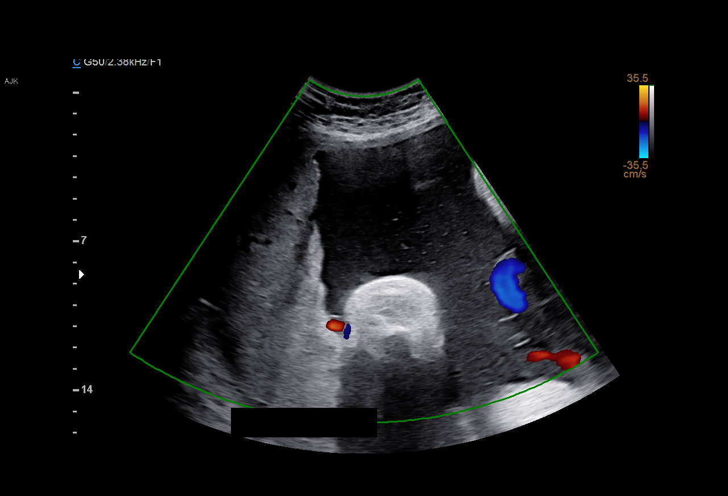
[im 19/26]
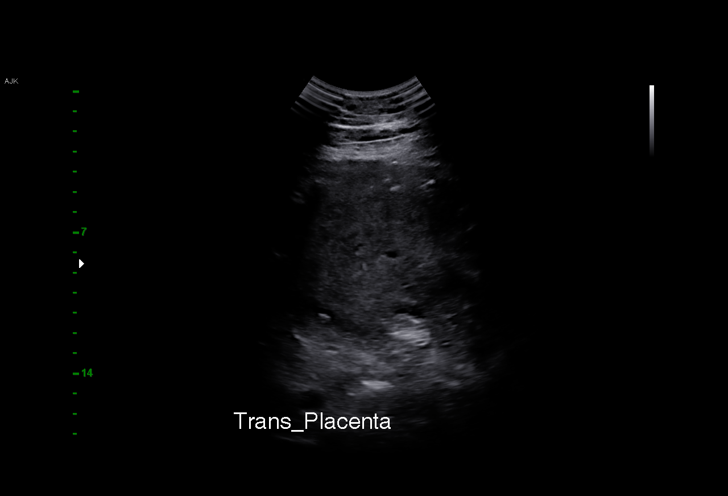
[im 20/26]
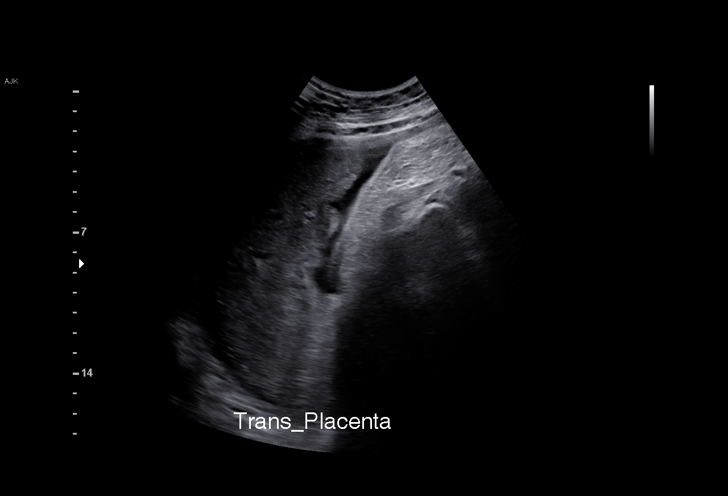
[im 22/26]
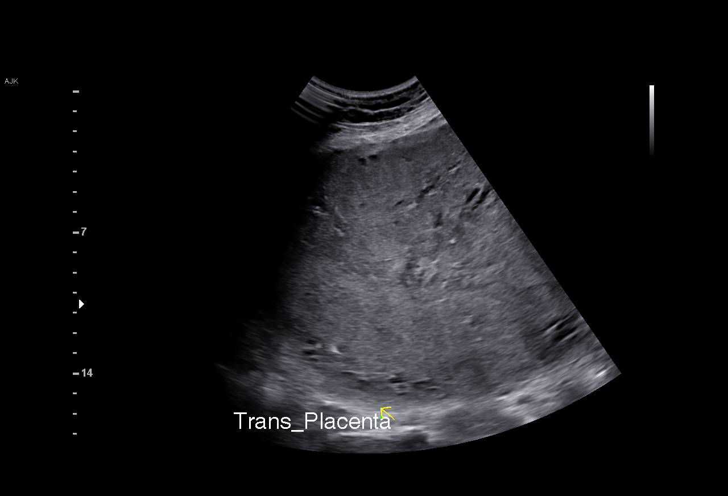
[im 24/26]
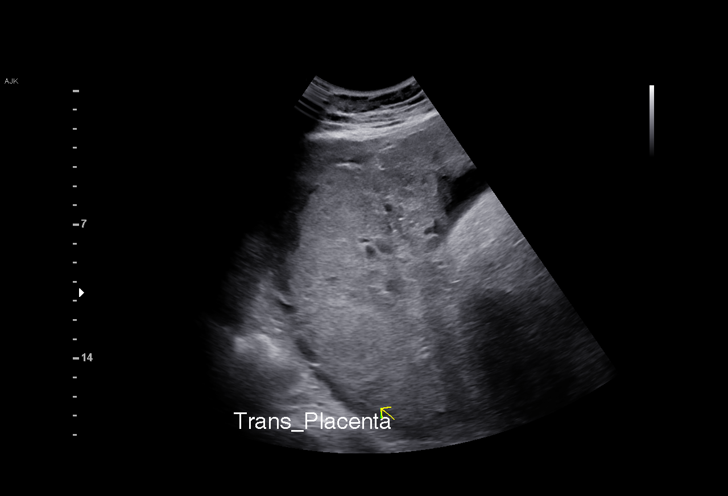
[im 26/26]
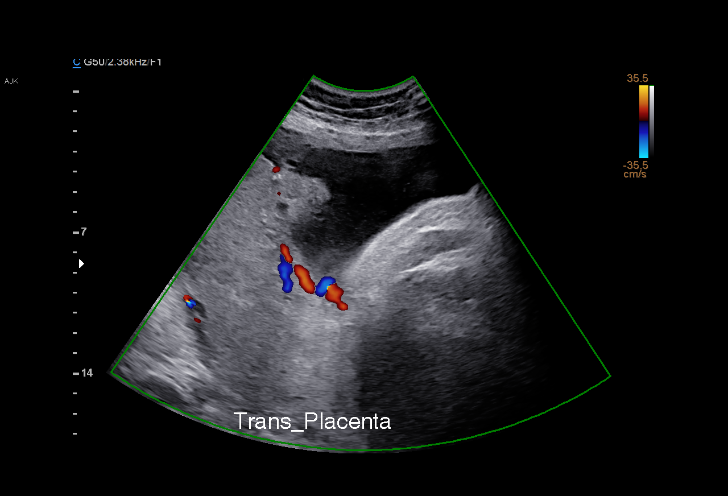

[15 of 26 positions shown; findings below may reference images not displayed]

1  US MFM OB LIMITED                     76815.01    HER

Indications

 36 weeks gestation of pregnancy
 Determine Fetal presentation by ultrasound      [EI]
Fetal Evaluation

 Num Of Fetuses:          1
 Fetal Heart Rate(bpm):   148
 Cardiac Activity:        Observed
 Presentation:            Cephalic
 Placenta:                Posterior
 P. Cord Insertion:       Visualized

 Amniotic Fluid
 AFI FV:      Polyhydramnios mild

 AFI Sum(cm)     %Tile       Largest Pocket(cm)
 25.1            95

 RUQ(cm)       RLQ(cm)       LUQ(cm)        LLQ(cm)


 Comment:    Stomach, bladder, and diaphragm seen.
OB History

 Gravidity:    7         Term:   3         SAB:   2
 TOP:          1
Gestational Age

 Clinical EDD:  36w 6d                                        EDD:   [DATE]
 Best:          36w 6d     Det. By:  Clinical EDD             EDD:   [DATE]
Impression

 Limited exam to assess
 There is good fetal movement and mild polyhydramnios
 noted.
 There is no evidence of placental previa or placenta previa
Recommendations

 Follow up a clinically indicated.

## 2020-11-09 MED ORDER — LACTATED RINGERS IV BOLUS
1000.0000 mL | Freq: Once | INTRAVENOUS | Status: AC
  Administered 2020-11-09: 1000 mL via INTRAVENOUS

## 2020-11-09 NOTE — Progress Notes (Addendum)
Subjective:    Perceives fewer, less intense ctx, w/ active FM. Denies LOF and vaginal bleeding.   Objective:    VS: BP 116/66 (BP Location: Left Arm)   Pulse 99   Temp 97.7 F (36.5 C) (Oral)   Resp 17   Ht 5' 3.5" (1.613 m)   Wt 102.2 kg   SpO2 98%   BMI 39.30 kg/m  FHR : baseline 150 / variability moderate / accelerations present / absent decelerations Toco: contractions every 3-6 minutes  Membranes: intact Dilation: 4.5 Effacement (%): 80 Cervical Position: Posterior Station: -3 Presentation: Vertex Exam by:: Claud Kelp, RN  Assessment/Plan:   30 y.o. [id number] [redacted]w[redacted]d Preterm labor   -plan to admit @ 37 wks FWB    -Cat 1 GBS    -neg     Arrie Eastern MSN, CNM 11/09/2020 10:45 AM   Saw patient at bedside.  Agree with above.  Patient with diagnosis of gestational  Hypertension.  Will start induction tomorrow. She is steroid complete.   Rogelio Seen Aleiya Rye

## 2020-11-10 ENCOUNTER — Inpatient Hospital Stay (HOSPITAL_COMMUNITY)
Admission: RE | Admit: 2020-11-10 | Payer: Medicaid Other | Source: Home / Self Care | Admitting: Obstetrics & Gynecology

## 2020-11-10 ENCOUNTER — Inpatient Hospital Stay (HOSPITAL_COMMUNITY): Payer: Medicaid Other | Admitting: Anesthesiology

## 2020-11-10 ENCOUNTER — Encounter (HOSPITAL_COMMUNITY): Admission: RE | Payer: Self-pay | Source: Home / Self Care

## 2020-11-10 ENCOUNTER — Inpatient Hospital Stay (HOSPITAL_COMMUNITY)
Admission: AD | Admit: 2020-11-10 | Payer: Medicaid Other | Source: Home / Self Care | Admitting: Obstetrics & Gynecology

## 2020-11-10 ENCOUNTER — Inpatient Hospital Stay (HOSPITAL_COMMUNITY): Payer: Medicaid Other

## 2020-11-10 ENCOUNTER — Encounter (HOSPITAL_COMMUNITY): Payer: Self-pay | Admitting: Obstetrics & Gynecology

## 2020-11-10 ENCOUNTER — Inpatient Hospital Stay (HOSPITAL_BASED_OUTPATIENT_CLINIC_OR_DEPARTMENT_OTHER): Payer: Medicaid Other

## 2020-11-10 DIAGNOSIS — Z3A37 37 weeks gestation of pregnancy: Secondary | ICD-10-CM

## 2020-11-10 DIAGNOSIS — Z3689 Encounter for other specified antenatal screening: Secondary | ICD-10-CM | POA: Diagnosis not present

## 2020-11-10 DIAGNOSIS — Z3A36 36 weeks gestation of pregnancy: Secondary | ICD-10-CM

## 2020-11-10 DIAGNOSIS — O320XX0 Maternal care for unstable lie, not applicable or unspecified: Secondary | ICD-10-CM

## 2020-11-10 LAB — CBC
HCT: 29.2 % — ABNORMAL LOW (ref 36.0–46.0)
HCT: 27.2 % — ABNORMAL LOW (ref 36.0–46.0)
Hemoglobin: 8.8 g/dL — ABNORMAL LOW (ref 12.0–15.0)
Hemoglobin: 8.4 g/dL — ABNORMAL LOW (ref 12.0–15.0)
MCH: 22.4 pg — ABNORMAL LOW (ref 26.0–34.0)
MCH: 22.6 pg — ABNORMAL LOW (ref 26.0–34.0)
MCHC: 30.1 g/dL (ref 30.0–36.0)
MCHC: 30.9 g/dL (ref 30.0–36.0)
MCV: 74.5 fL — ABNORMAL LOW (ref 80.0–100.0)
MCV: 73.3 fL — ABNORMAL LOW (ref 80.0–100.0)
Platelets: 242 10*3/uL (ref 150–400)
Platelets: 208 10*3/uL (ref 150–400)
RBC: 3.92 MIL/uL (ref 3.87–5.11)
RBC: 3.71 MIL/uL — ABNORMAL LOW (ref 3.87–5.11)
RDW: 16 % — ABNORMAL HIGH (ref 11.5–15.5)
RDW: 16 % — ABNORMAL HIGH (ref 11.5–15.5)
WBC: 8.3 10*3/uL (ref 4.0–10.5)
WBC: 12 10*3/uL — ABNORMAL HIGH (ref 4.0–10.5)
nRBC: 0 % (ref 0.0–0.2)
nRBC: 0 % (ref 0.0–0.2)

## 2020-11-10 LAB — RAPID HIV SCREEN (HIV 1/2 AB+AG)
HIV 1/2 Antibodies: NONREACTIVE
HIV-1 P24 Antigen - HIV24: NONREACTIVE

## 2020-11-10 IMAGING — US US MFM OB LIMITED
1 series · 13 of 13 positions shown · non-contrast
Comparison: none

[Series 1: us mfm ob limited · 13 of 13 slices shown]
[im 1/13]
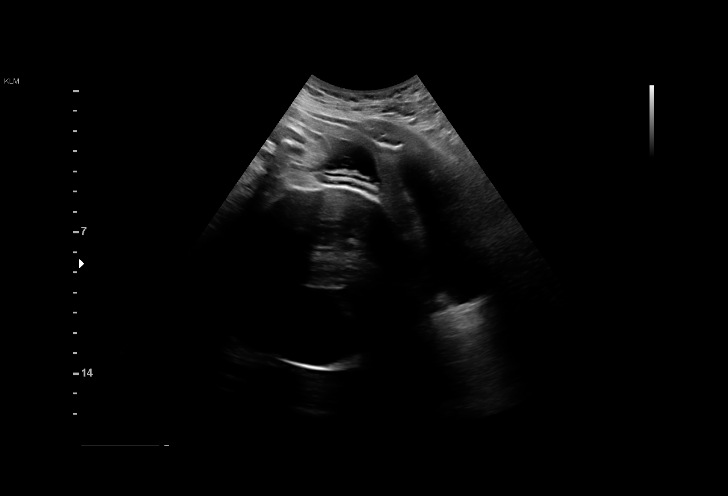
[im 2/13]
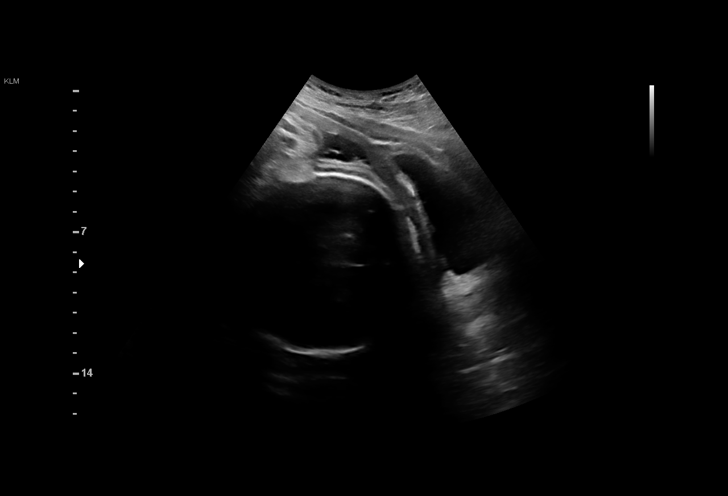
[im 3/13]
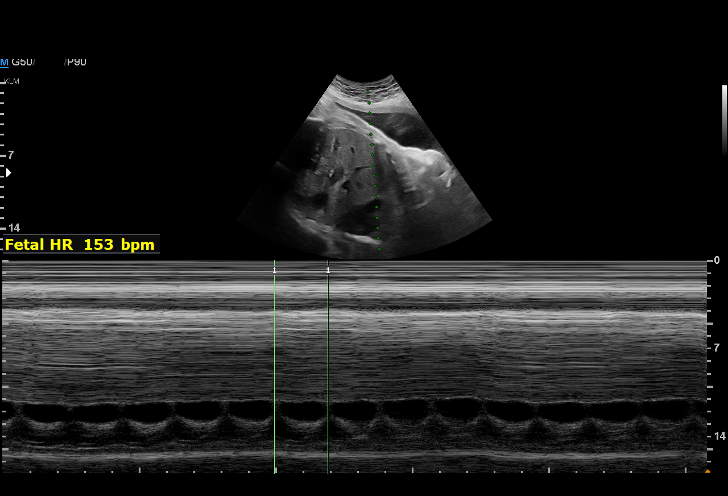
[im 4/13]
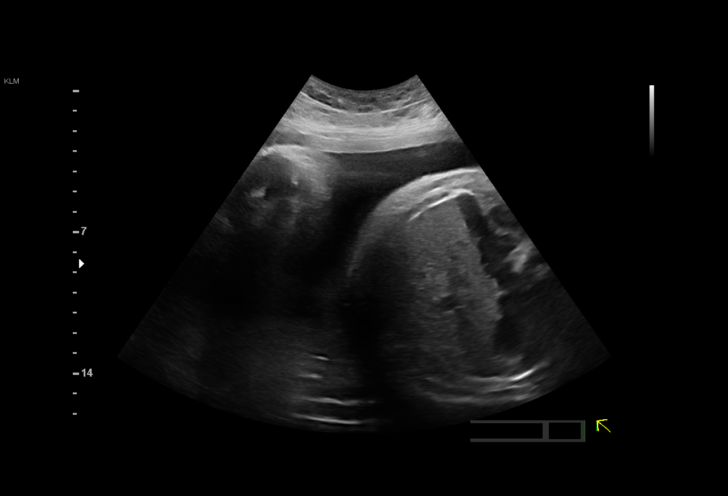
[im 5/13]
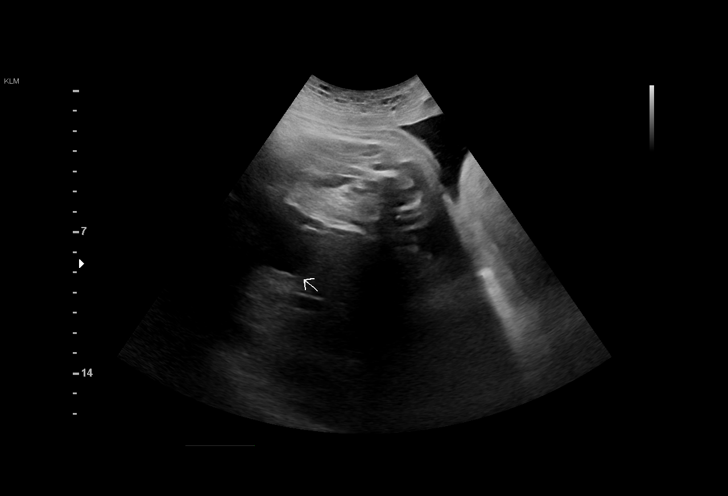
[im 6/13]
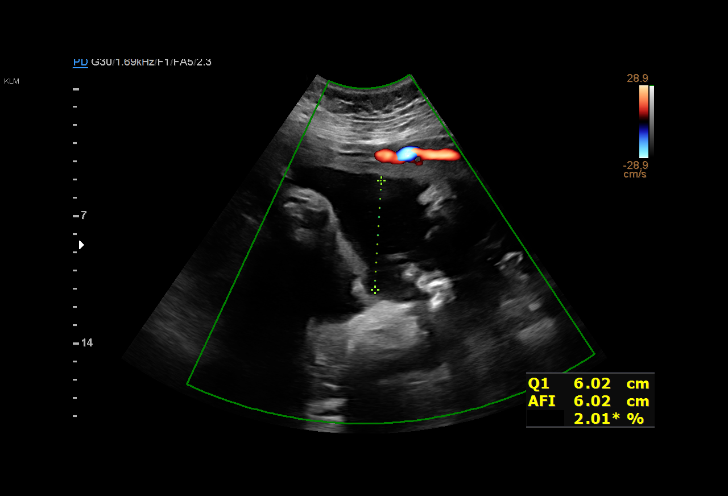
[im 7/13]
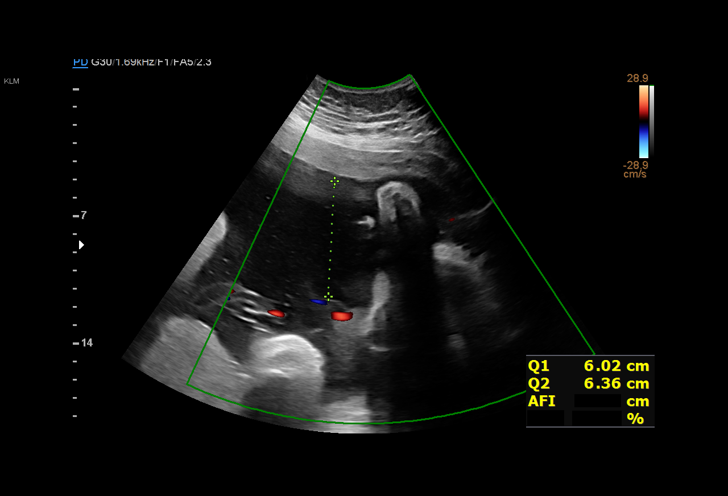
[im 8/13]
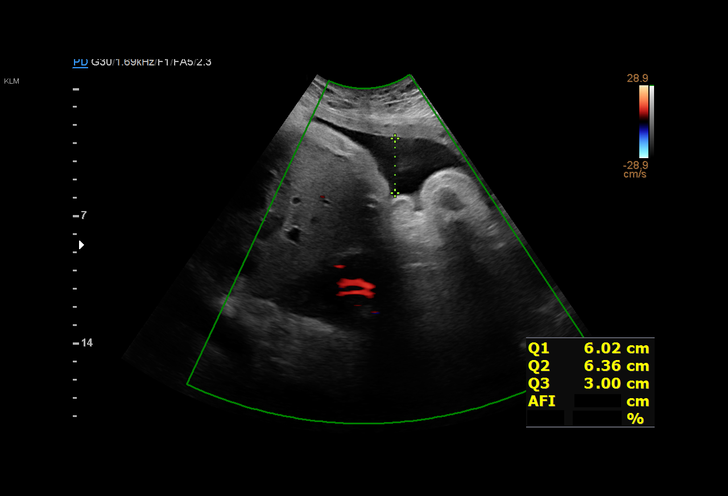
[im 9/13]
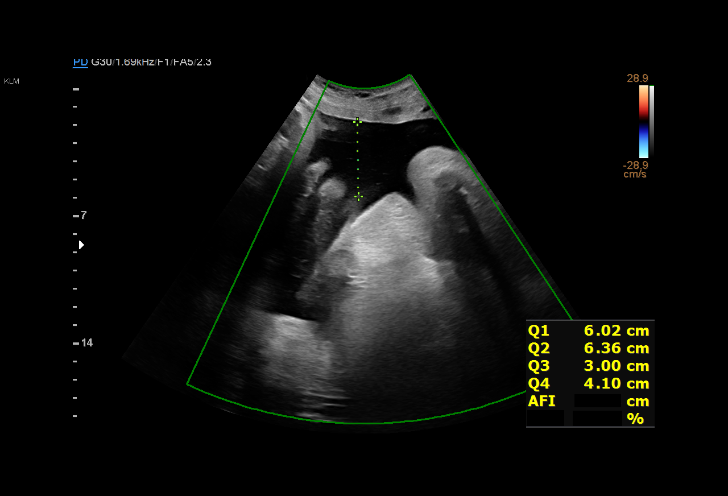
[im 10/13]
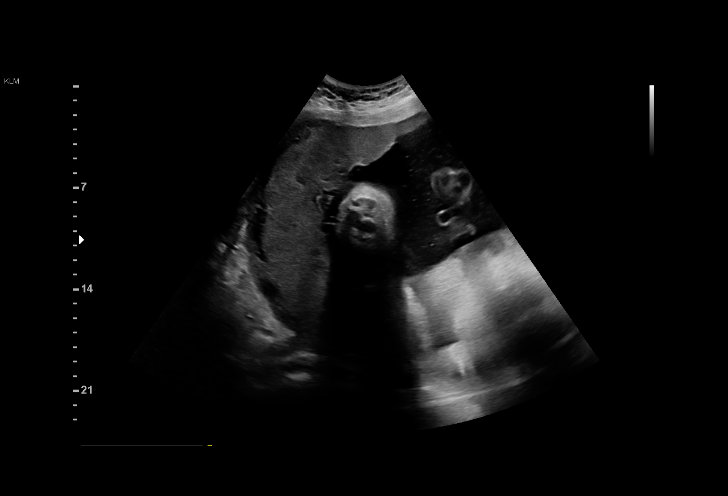
[im 11/13]
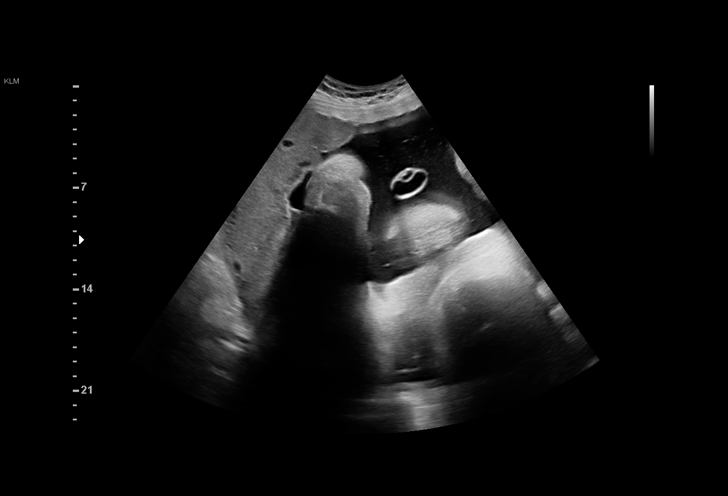
[im 12/13]
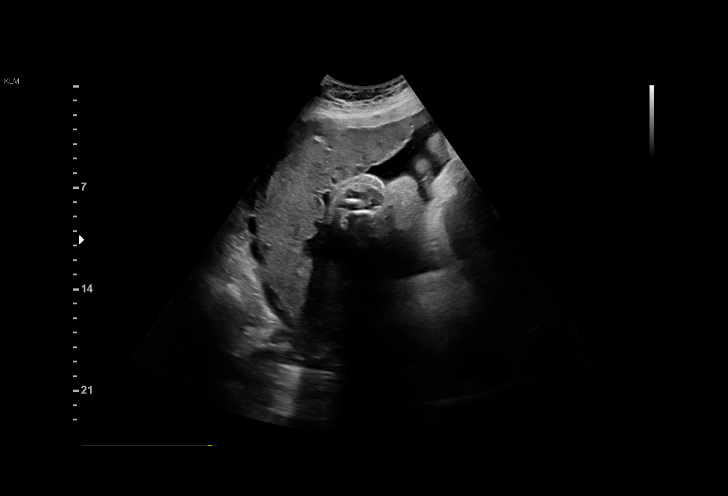
[im 13/13]
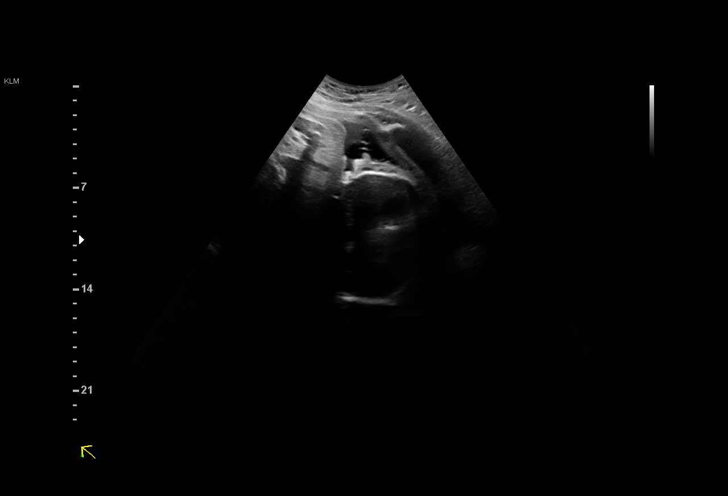

[13 of 13 positions shown; findings below may reference images not displayed]

1  US MFM OB LIMITED                     76815.01    KRIEK
                                                      KRIEK

Indications

 Determine Fetal presentation by ultrasound      [UB]
 37 weeks gestation of pregnancy
Fetal Evaluation

 Num Of Fetuses:          1
 Fetal Heart Rate(bpm):   153
 Cardiac Activity:        Observed
 Presentation:            Cephalic
 Placenta:                Posterior
 P. Cord Insertion:       Previously Visualized

 Amniotic Fluid
 AFI FV:      Within normal limits

 AFI Sum(cm)     %Tile       Largest Pocket(cm)
 19.48           75

 RUQ(cm)       RLQ(cm)       LUQ(cm)        LLQ(cm)
 6.02          4.1           6.36           3
OB History

 Gravidity:    7         Term:   3         SAB:   2
 TOP:          1
Gestational Age

 Clinical EDD:  37w 0d                                        EDD:   [DATE]
 Best:          37w 0d     Det. By:  Clinical EDD             EDD:   [DATE]
Anatomy
 Stomach:               Appears normal, left   Bladder:                Appears normal
                        sided
Impression

 Limited exam
 Cephalic presentation
 Good fetal movement and amniotic fluid
Recommendations

 Follow up as clinically indicated.

## 2020-11-10 SURGERY — Surgical Case
Anesthesia: Regional

## 2020-11-10 MED ORDER — OXYCODONE-ACETAMINOPHEN 5-325 MG PO TABS: 1.0000 | ORAL_TABLET | ORAL | Status: DC | PRN

## 2020-11-10 MED ORDER — ONDANSETRON HCL 4 MG/2ML IJ SOLN
4.0000 mg | Freq: Four times a day (QID) | INTRAMUSCULAR | Status: DC | PRN
  Administered 2020-11-10: 4 mg via INTRAVENOUS
  Filled 2020-11-10: qty 2

## 2020-11-10 MED ORDER — WITCH HAZEL-GLYCERIN EX PADS: 1.0000 "application " | MEDICATED_PAD | CUTANEOUS | Status: DC | PRN

## 2020-11-10 MED ORDER — ONDANSETRON HCL 4 MG/2ML IJ SOLN
4.0000 mg | INTRAMUSCULAR | Status: DC | PRN
Start: 2020-11-10 — End: 2020-11-12

## 2020-11-10 MED ORDER — FENTANYL CITRATE (PF) 100 MCG/2ML IJ SOLN: 50.0000 ug | INTRAMUSCULAR | Status: DC | PRN

## 2020-11-10 MED ORDER — TRANEXAMIC ACID-NACL 1000-0.7 MG/100ML-% IV SOLN: 1000.0000 mg | Freq: Once | INTRAVENOUS | Status: DC

## 2020-11-10 MED ORDER — DIPHENHYDRAMINE HCL 50 MG/ML IJ SOLN: 12.5000 mg | INTRAMUSCULAR | Status: DC | PRN

## 2020-11-10 MED ORDER — ACETAMINOPHEN 325 MG PO TABS: 650.0000 mg | ORAL_TABLET | ORAL | Status: DC | PRN

## 2020-11-10 MED ORDER — PRENATAL MULTIVITAMIN CH
1.0000 | ORAL_TABLET | Freq: Every day | ORAL | Status: DC
  Administered 2020-11-11 – 2020-11-12 (×2): 1 via ORAL
  Filled 2020-11-10 (×2): qty 1

## 2020-11-10 MED ORDER — COCONUT OIL OIL: 1.0000 "application " | TOPICAL_OIL | Status: DC | PRN

## 2020-11-10 MED ORDER — SOD CITRATE-CITRIC ACID 500-334 MG/5ML PO SOLN: 30.0000 mL | ORAL | Status: DC | PRN

## 2020-11-10 MED ORDER — SIMETHICONE 80 MG PO CHEW
80.0000 mg | CHEWABLE_TABLET | ORAL | Status: DC | PRN
  Filled 2020-11-10: qty 1

## 2020-11-10 MED ORDER — LACTATED RINGERS IV SOLN: INTRAVENOUS | Status: DC

## 2020-11-10 MED ORDER — ZOLPIDEM TARTRATE 5 MG PO TABS: 5.0000 mg | ORAL_TABLET | Freq: Every evening | ORAL | Status: DC | PRN

## 2020-11-10 MED ORDER — SENNOSIDES-DOCUSATE SODIUM 8.6-50 MG PO TABS
2.0000 | ORAL_TABLET | Freq: Every day | ORAL | Status: DC
  Administered 2020-11-11: 2 via ORAL
  Filled 2020-11-10 (×2): qty 2

## 2020-11-10 MED ORDER — PHENYLEPHRINE 40 MCG/ML (10ML) SYRINGE FOR IV PUSH (FOR BLOOD PRESSURE SUPPORT): 80.0000 ug | PREFILLED_SYRINGE | INTRAVENOUS | Status: DC | PRN

## 2020-11-10 MED ORDER — DIPHENHYDRAMINE HCL 25 MG PO CAPS: 25.0000 mg | ORAL_CAPSULE | Freq: Four times a day (QID) | ORAL | Status: DC | PRN

## 2020-11-10 MED ORDER — IBUPROFEN 600 MG PO TABS
600.0000 mg | ORAL_TABLET | Freq: Four times a day (QID) | ORAL | Status: DC
  Administered 2020-11-11 – 2020-11-12 (×6): 600 mg via ORAL
  Filled 2020-11-10 (×6): qty 1

## 2020-11-10 MED ORDER — LIDOCAINE HCL (PF) 1 % IJ SOLN
INTRAMUSCULAR | Status: DC | PRN
  Administered 2020-11-10 (×2): 4 mL via EPIDURAL

## 2020-11-10 MED ORDER — LACTATED RINGERS IV SOLN: 500.0000 mL | Freq: Once | INTRAVENOUS | Status: DC

## 2020-11-10 MED ORDER — OXYCODONE-ACETAMINOPHEN 5-325 MG PO TABS: 2.0000 | ORAL_TABLET | ORAL | Status: DC | PRN

## 2020-11-10 MED ORDER — BENZOCAINE-MENTHOL 20-0.5 % EX AERO: 1.0000 "application " | INHALATION_SPRAY | CUTANEOUS | Status: DC | PRN

## 2020-11-10 MED ORDER — FENTANYL CITRATE (PF) 100 MCG/2ML IJ SOLN
INTRAMUSCULAR | Status: DC | PRN
  Administered 2020-11-10: 100 ug via EPIDURAL

## 2020-11-10 MED ORDER — EPHEDRINE 5 MG/ML INJ: 10.0000 mg | INTRAVENOUS | Status: DC | PRN

## 2020-11-10 MED ORDER — OXYTOCIN BOLUS FROM INFUSION
333.0000 mL | Freq: Once | INTRAVENOUS | Status: AC
  Administered 2020-11-10: 333 mL via INTRAVENOUS

## 2020-11-10 MED ORDER — ONDANSETRON HCL 4 MG PO TABS: 4.0000 mg | ORAL_TABLET | ORAL | Status: DC | PRN

## 2020-11-10 MED ORDER — BUPIVACAINE HCL (PF) 0.25 % IJ SOLN
INTRAMUSCULAR | Status: DC | PRN
  Administered 2020-11-10: 8 mL via EPIDURAL

## 2020-11-10 MED ORDER — OXYTOCIN-SODIUM CHLORIDE 30-0.9 UT/500ML-% IV SOLN
1.0000 m[IU]/min | INTRAVENOUS | Status: DC
Start: 2020-11-10 — End: 2020-11-10
  Administered 2020-11-10: 2 m[IU]/min via INTRAVENOUS
  Filled 2020-11-10: qty 500

## 2020-11-10 MED ORDER — LACTATED RINGERS IV SOLN: 500.0000 mL | INTRAVENOUS | Status: DC | PRN

## 2020-11-10 MED ORDER — OXYTOCIN-SODIUM CHLORIDE 30-0.9 UT/500ML-% IV SOLN: 2.5000 [IU]/h | INTRAVENOUS | Status: DC

## 2020-11-10 MED ORDER — FENTANYL-BUPIVACAINE-NACL 0.5-0.125-0.9 MG/250ML-% EP SOLN
12.0000 mL/h | EPIDURAL | Status: DC | PRN
  Administered 2020-11-10: 12 mL/h via EPIDURAL
  Filled 2020-11-10: qty 250

## 2020-11-10 MED ORDER — TRANEXAMIC ACID-NACL 1000-0.7 MG/100ML-% IV SOLN
INTRAVENOUS | Status: AC
  Administered 2020-11-10: 1000 mg
  Filled 2020-11-10: qty 100

## 2020-11-10 MED ORDER — TETANUS-DIPHTH-ACELL PERTUSSIS 5-2.5-18.5 LF-MCG/0.5 IM SUSY: 0.5000 mL | PREFILLED_SYRINGE | Freq: Once | INTRAMUSCULAR | Status: DC

## 2020-11-10 MED ORDER — TERBUTALINE SULFATE 1 MG/ML IJ SOLN: 0.2500 mg | Freq: Once | INTRAMUSCULAR | Status: DC | PRN

## 2020-11-10 MED ORDER — FENTANYL CITRATE (PF) 100 MCG/2ML IJ SOLN
100.0000 ug | Freq: Once | INTRAMUSCULAR | Status: AC
  Administered 2020-11-10: 100 ug via EPIDURAL
  Filled 2020-11-10: qty 2

## 2020-11-10 MED ORDER — DIBUCAINE (PERIANAL) 1 % EX OINT: 1.0000 "application " | TOPICAL_OINTMENT | CUTANEOUS | Status: DC | PRN

## 2020-11-10 MED ORDER — LIDOCAINE HCL (PF) 1 % IJ SOLN: 30.0000 mL | INTRAMUSCULAR | Status: DC | PRN

## 2020-11-10 NOTE — Lactation Note (Signed)
This note was copied from a baby's chart. Lactation Consultation Note  Patient Name: Anna Burns Date: 11/10/2020 Reason for consult: Mother's request;Early term 37-38.6wks;L&D Initial assessment Age:30 hours P4, ETI female infant. Mom decided to be seen in L&D by [facility] services . LC entered the room, infant was being weighed by L&D ( RN) and infant was cuing to breastfeed. LC notice mom has flat nipples, LC ask mom to do breast stimulation by hand expression to help evert nipple shaft out more prior to latching infant at the breast. Mom latched infant on her left breast using the football hold position, after a few attempts infant sustained latch and breastfeed for 12 minutes. LC discussed hand expression and mom taught back infant was given 3 mls of colostrum by spoon. As LC was leaving the room, mom re-latched infant on her left breast. LC will discuss with [facility] (RN) to give mom hand pump to pre-pump breast prior to latching infant to help infant latch with depth and breast shells for mom to wear in bra during the day due mom having nipple edema.  Mom knows to breastfeed infant according to primal cues: hands in mouth, rooting, licking, tasting, STS. Mom knows to call [facility] RN or [facility] if she needs further assistance with latching infant at the breast.  Maternal Data Has patient been taught Hand Expression?: Yes Does the patient have breastfeeding experience prior to this delivery?: Yes How long did the patient breastfeed?: Per mom, she briefly latch 2nd child for few days had cracked and bleeding nipples, she didnt BF her 1st and 3rd child.  Feeding Mother's Current Feeding Choice: Breast Milk and Formula  LATCH Score Latch: Grasps breast easily, tongue down, lips flanged, rhythmical sucking.  Audible Swallowing: A few with stimulation  Type of Nipple: Flat  Comfort (Breast/Nipple): Soft / non-tender  Hold (Positioning): Assistance needed to correctly position infant at  breast and maintain latch.  LATCH Score: 7   Lactation Tools Discussed/Used Tools: Pump;Shells Breast pump type: Manual Reason for Pumping: Mom will pre-pump breast with hand pump prior to latching infant at the breast, LC will discussed this with [facility] (RN)  Interventions Interventions: Breast feeding basics reviewed;Assisted with latch;Skin to skin;Hand express;Breast compression;Adjust position;Support pillows;Position options;Expressed milk;Shells;Hand pump;Education  Discharge Pump: Manual WIC Program: Yes  Consult Status Consult Status: Follow-up Date: 11/11/20 Follow-up type: In-patient    Anna Burns 11/10/2020, 9:12 PM

## 2020-11-10 NOTE — Lactation Note (Signed)
This note was copied from a baby's chart. Lactation Consultation Note  Patient Name: Anna Burns [id number] Date: 11/10/2020   Age:30 hours P3, ETI female infant. Per , RN in L&D mom is having Nausea at this time and prefers to see [facility] services on MBU.   Maternal Data    Feeding    LATCH Score                    Lactation Tools Discussed/Used    Interventions    Discharge    Consult Status      [name] 11/10/2020, 8:22 PM

## 2020-11-10 NOTE — Anesthesia Preprocedure Evaluation (Signed)
Anesthesia Evaluation  Patient identified by MRN, date of birth, ID band Patient awake    Reviewed: Allergy & Precautions, Patient's Chart, lab work & pertinent test results  History of Anesthesia Complications Negative for: history of anesthetic complications  Airway Mallampati: II  TM Distance: >3 FB Neck ROM: Full    Dental no notable dental hx.    Pulmonary asthma ,    Pulmonary exam normal        Cardiovascular hypertension (gestational), Normal cardiovascular exam     Neuro/Psych  Headaches, negative psych ROS   GI/Hepatic negative GI ROS, Neg liver ROS,   Endo/Other  negative endocrine ROS  Renal/GU negative Renal ROS  negative genitourinary   Musculoskeletal negative musculoskeletal ROS (+)   Abdominal   Peds  Hematology  (+) anemia , Hgb 8.8   Anesthesia Other Findings Day of surgery medications reviewed with patient.  Reproductive/Obstetrics (+) Pregnancy                            Anesthesia Physical Anesthesia Plan  ASA: III  Anesthesia Plan: Epidural   Post-op Pain Management:    Induction:   PONV Risk Score and Plan: Treatment may vary due to age or medical condition  Airway Management Planned: Natural Airway  Additional Equipment:   Intra-op Plan:   Post-operative Plan:   Informed Consent: I have reviewed the patients History and Physical, chart, labs and discussed the procedure including the risks, benefits and alternatives for the proposed anesthesia with the patient or authorized representative who has indicated his/her understanding and acceptance.       Plan Discussed with:   Anesthesia Plan Comments:         Anesthesia Quick Evaluation

## 2020-11-10 NOTE — Anesthesia Procedure Notes (Signed)
Epidural Patient location during procedure: OB Start time: 11/10/2020 3:13 PM End time: 11/10/2020 3:16 PM  Staffing Anesthesiologist: Brennan Bailey, MD Performed: anesthesiologist   Preanesthetic Checklist Completed: patient identified, IV checked, risks and benefits discussed, monitors and equipment checked, pre-op evaluation and timeout performed  Epidural Patient position: sitting Prep: DuraPrep and site prepped and draped Patient monitoring: continuous pulse ox, blood pressure and heart rate Approach: midline Location: L3-L4 Injection technique: LOR air  Needle:  Needle type: Tuohy  Needle gauge: 17 G Needle length: 9 cm Needle insertion depth: 5 cm Catheter type: closed end flexible Catheter size: 19 Gauge Catheter at skin depth: 10 cm Test dose: negative and Other (1% lidocaine)  Assessment Events: blood not aspirated, injection not painful, no injection resistance, no paresthesia and negative IV test  Additional Notes Patient identified. Risks, benefits, and alternatives discussed with patient including but not limited to bleeding, infection, nerve damage, paralysis, failed block, incomplete pain control, headache, blood pressure changes, nausea, vomiting, reactions to medication, itching, and postpartum back pain. Confirmed with bedside nurse the patient's most recent platelet count. Confirmed with patient that they are not currently taking any anticoagulation, have any bleeding history, or any family history of bleeding disorders. Patient expressed understanding and wished to proceed. All questions were answered. Sterile technique was used throughout the entire procedure. Please see nursing notes for vital signs.   Crisp LOR on first pass. Test dose was given through epidural catheter and negative prior to continuing to dose epidural or start infusion. Warning signs of high block given to the patient including shortness of breath, tingling/numbness in hands, complete  motor block, or any concerning symptoms with instructions to call for help. Patient was given instructions on fall risk and not to get out of bed. All questions and concerns addressed with instructions to call with any issues or inadequate analgesia.  Reason for block:procedure for pain

## 2020-11-10 NOTE — Progress Notes (Signed)
Subjective:    Margot had some contractions last PM. U/S done -vertex at that time. Baby has an unstable lie. U/S ordered before IOL today.   Objective:    VS: BP 121/72 (BP Location: Left Arm)   Pulse 93   Temp 98.2 F (36.8 C) (Oral)   Resp 16   Ht 5' 3.5" (1.613 m)   Wt 102.2 kg   SpO2 99%   BMI 39.30 kg/m   Membranes: intact  Dilation: 4 Effacement (%): 60 Cervical Position: Posterior Station: Ballotable Presentation: Vertex Exam by:: Cecelia Byars, RN   Assessment/Plan:   30 y.o. [id number] [redacted]w[redacted]d IOL today for GHTN Dr Alwyn Pea to manage care of pt.   Domingo Pulse MSN, CNM 11/10/2020 9:36 AM

## 2020-11-11 DIAGNOSIS — D509 Iron deficiency anemia, unspecified: Secondary | ICD-10-CM | POA: Diagnosis present

## 2020-11-11 LAB — CBC
HCT: 25.9 % — ABNORMAL LOW (ref 36.0–46.0)
Hemoglobin: 8 g/dL — ABNORMAL LOW (ref 12.0–15.0)
MCH: 22.5 pg — ABNORMAL LOW (ref 26.0–34.0)
MCHC: 30.9 g/dL (ref 30.0–36.0)
MCV: 72.8 fL — ABNORMAL LOW (ref 80.0–100.0)
Platelets: 215 10*3/uL (ref 150–400)
RBC: 3.56 MIL/uL — ABNORMAL LOW (ref 3.87–5.11)
RDW: 16 % — ABNORMAL HIGH (ref 11.5–15.5)
WBC: 10.8 10*3/uL — ABNORMAL HIGH (ref 4.0–10.5)
nRBC: 0 % (ref 0.0–0.2)

## 2020-11-11 LAB — RPR: RPR Ser Ql: NONREACTIVE

## 2020-11-11 MED ORDER — CALCIUM CARBONATE ANTACID 500 MG PO CHEW
1.0000 | CHEWABLE_TABLET | ORAL | Status: DC | PRN
  Administered 2020-11-11 (×2): 200 mg via ORAL
  Filled 2020-11-11 (×2): qty 1

## 2020-11-11 NOTE — Lactation Note (Addendum)
This note was copied from a baby's chart. Lactation Consultation Note  Patient Name: Girl Anna Burns [id number] Date: 11/11/2020 Reason for consult: Initial assessment;Mother's request;Difficult latch;Early term 37-38.6wks Age:30 hours P4, ETI infant . Mom requested LC due to having sensitive nipples  that are flat, mild edema  and infant with a strong suck.  Mom fitted with 24 mm NS, she used one with previous child, mom feels the 24 mm NS is helping with her breast sensitivity.. LC gave mom NS ( 83mm) , breast shells to wear during the day in bra and hand pump to pre-pump breast prior to latching infant. Per mom, the 24 mm NS does feel better when  Infant is latched at the breast, infant was latched on mom's right breast with depth, but mom complains of strong tug but not having pinching at breast. Mom will latch infant tonight but will decide in morning if she wants to pump only and formula feed infant. Mom knows if she needs assistance with latching infant to ask RN or [facility] services. Mom will continue to breastfeed infant according to cues, 8 to 12+ or more times within 24 hours, STS.  Maternal Data Has patient been taught Hand Expression?: Yes Does the patient have breastfeeding experience prior to this delivery?: Yes How long did the patient breastfeed?: Per mom, she briefly latch 2nd child for few days had cracked and bleeding nipples, she didnt BF her 1st and 3rd child.  Feeding Mother's Current Feeding Choice: Breast Milk and Formula  LATCH Score Latch: Grasps breast easily, tongue down, lips flanged, rhythmical sucking.  Audible Swallowing: A few with stimulation  Type of Nipple: Flat  Comfort (Breast/Nipple): Soft / non-tender  Hold (Positioning): Assistance needed to correctly position infant at breast and maintain latch.  LATCH Score: 7   Lactation Tools Discussed/Used Tools: Shells;Pump;Nipple Shields Nipple shield size: 24 Breast pump type: Manual Reason for  Pumping: Mom will pre-pump breast with hand pump prior to latching infant at the breast, LC will discussed this with [facility] (RN)  Interventions Interventions: Breast feeding basics reviewed;Assisted with latch;Skin to skin;Breast massage;Breast compression;Adjust position;Support pillows;Position options;Expressed milk;Hand pump;Education  Discharge Pump: Manual WIC Program: Yes  Consult Status Consult Status: Follow-up Date: 11/11/20 Follow-up type: In-patient    [name] 11/11/2020, 12:09 AM

## 2020-11-11 NOTE — Anesthesia Postprocedure Evaluation (Signed)
Anesthesia Post Note  Patient: Anna Burns  Procedure(s) Performed: AN AD [place]     Patient location during evaluation: Mother Baby Anesthesia Type: Epidural Level of consciousness: awake and alert Pain management: pain level controlled Vital Signs Assessment: post-procedure vital signs reviewed and stable Respiratory status: spontaneous breathing, nonlabored ventilation and respiratory function stable Cardiovascular status: stable Postop Assessment: no headache, no backache and epidural receding Anesthetic complications: no   No complications documented.  Last Vitals:  Vitals:   11/10/20 2235 11/11/20 0620  BP: 123/66 118/85  Pulse: 93 84  Resp: 18 18  Temp: 36.9 C 37 C  SpO2:      Last Pain:  Vitals:   11/11/20 0620  TempSrc: Oral  PainSc:    Pain Goal: Patients Stated Pain Goal: 0 (11/09/20 0450)                 Rayvon Char

## 2020-11-11 NOTE — Progress Notes (Addendum)
PPD# 1 SVD w/ intact perineum Information for the patient's newborn:  Anna Burns, Anna Burns [[id number]]  female    Baby Name Halo Circumcision N/A   S:   Reports feeling tired Tolerating PO fluid and solids No nausea or vomiting Bleeding is light, no clots Pain controlled with PO meds Up ad lib / ambulatory / voiding w/o difficulty Feeding: Breast    O:   VS: BP 111/73   Pulse 92   Temp 99 F (37.2 C) (Axillary)   Resp 17   Ht 5' 3.5" (1.613 m)   Wt 102.2 kg   SpO2 97%   Breastfeeding Unknown   BMI 39.30 kg/m   LABS:  Recent Labs    11/10/20 2146 11/11/20 0524  WBC 12.0* 10.8*  HGB 8.4* 8.0*  PLT 208 215   Blood type: --/--/B POS (04/26 1831) Rubella: Immune (10/28 0000)                      I&O: Intake/Output      04/29 0701 04/30 0700 04/30 0701 05/01 0700   Urine (mL/kg/hr) 800 (0.3)    Blood 200    Total Output 1000    Net -1000           Physical Exam: Alert and oriented X3 Lungs: Clear and unlabored Heart: regular rate and rhythm / no mumurs Abdomen: soft, non-tender, non-distended  Fundus: firm, non-tender Perineum: intact, non-edematous Lochia: appropriate Extremities: trace edema, no calf pain or tenderness    A:  PPD # 1  Normal exam IDA    -asymptomatic, declines iron infusion  P:  Routine post partum orders Anticipate D/C on 11/12/20   Arrie Eastern, MSN, CNM 11/11/2020, 11:54 AM

## 2020-11-12 MED ORDER — ACETAMINOPHEN 325 MG PO TABS: 650.0000 mg | ORAL_TABLET | ORAL | Status: DC | PRN

## 2020-11-12 MED ORDER — IBUPROFEN 600 MG PO TABS: 600.0000 mg | ORAL_TABLET | Freq: Four times a day (QID) | ORAL | 0 refills | Status: DC

## 2020-11-12 NOTE — Lactation Note (Signed)
This note was copied from a baby's chart. Lactation Consultation Note  Patient Name: Girl Emalia Witkop VZCHY'I Date: 11/12/2020 Reason for consult: Follow-up assessment;Early term 37-38.6wks;Hyperbilirubinemia Age:30 years  P4 mother whose infant is now 75 hours old.  This is an ETI at 37+0 weeks.  Mother's feeding preference on admission was breast/bottle.  Mother does not have breast feeding experience with her other children (now 54, 42 and 10 year old).  She attempted with her second child but was not successful.  Mother's feeding preference currently is to pump and bottle feed only.  Baby was asleep in the bassinet when I arrived.  Mother reported that her nipples have been "Very, very, very sensitive" and she wants to pump and bottle feed only.  She has a strong desire to provide breast milk but does not desire to latch due to nipple sensitivity (which has been present with all of her children) and she will be returning to school in August).  Provided coconut oil with a discussion on using this every time after putting EBM on nipples and areolas.    Mother has a DEBP in her room, however, has not been pumping consistently.  She was unaware of the frequency of pumping or the importance of pumping with an ETI.  Comprehensive education completed regarding feeding and pumping.  Mother had size #24 flanges with her pump kit.  Upon observation, she required #30 flanges for a good fit and comfort level.  Reviewed the LPTI policy guidelines and encouraged mother to feed more volume per session.  Mother was unaware of supplementation guidelines and feels comfortable now with volume amounts to be given.  Demonstrated paced bottle feeding using the purple extra slow flow nipple and baby consumed the volume easily.  Burped well after feeding and fell asleep.    Mother is a [facility] participant in [place].  No WIC referral was sent.  Mother informed me that she has a friend that will be providing a DEBP for  her.  Stressed the importance of having this pump right after discharge and mother will be getting it today.  Feeding/pumping plan established for after discharge.  Mother will be obtaining the formula package from the [facility] department.    Mother appreciative of help provided and is ready for a discharge.  She has a good family support system at home to assist with this baby and the other children.  Mother feels confident in her ability to provide breast milk.  She has our OP phone number for any further questions/concerns.      Maternal Data Has patient been taught Hand Expression?: Yes  Feeding Mother's Current Feeding Choice: Breast Milk and Formula  LATCH Score                    Lactation Tools Discussed/Used Tools: Pump;Flanges;Coconut oil Flange Size: 30 Breast pump type: Double-Electric Breast Pump;Manual Pump Education: Setup, frequency, and cleaning;Milk Storage Reason for Pumping: Breast stimulation for ETI Pumping frequency: Every three hours Pumped volume: 0 mL  Interventions    Discharge Discharge Education: Engorgement and breast care Pump: DEBP;Manual;Personal (Getting a DEBP today from a friend) [facility] Program: Yes  Consult Status Consult Status: Complete Date: 11/12/20 Follow-up type: Call as needed    [place] 11/12/2020, 9:04 AM

## 2020-11-12 NOTE — Discharge Summary (Signed)
Postpartum Discharge Summary  Date of Service updated 11/12/20    Patient Name: Anna Burns DOB: 1990-10-20 MRN: [id number]  Date of admission: 11/07/2020 Delivery date:11/10/2020  Delivering provider: Sanjuana Kava  Date of discharge: 11/12/2020  Admitting diagnosis: Preterm labor in third trimester [O60.03] Intrauterine pregnancy: [redacted]w[redacted]d    Secondary diagnosis:  Active Problems:   Preterm labor in third trimester   SVD (spontaneous vaginal delivery)  Additional problems: none    Discharge diagnosis: Term Pregnancy Delivered and iron defciency anemia                                              Post partum procedures:none Augmentation: AROM and Pitocin Complications: None  Hospital course: Induction of Labor With Vaginal Delivery   30y.o. yo [id number] admitted to the hospital 11/07/2020 for induction of labor.  Indication for induction: Favorable cervix at term and Unstable lie.  Patient had an uncomplicated labor course as follows: Membrane Rupture Time/Date: 2:43 PM ,11/10/2020   Delivery Method:Vaginal, Spontaneous  Episiotomy: None  Lacerations:  None  Details of delivery can be found in separate delivery note.  Patient had a routine postpartum course. Patient is discharged home 11/12/20.  Newborn Data: Birth date:11/10/2020  Birth time:7:17 PM  Gender:Female  Living status:Living  Apgars:8 ,9  Weight:3606 g   Magnesium Sulfate received: No BMZ received: No Rhophylac:N/A MMR:N/A T-DaP:declined Flu: No Transfusion:No  Physical exam  Vitals:   11/11/20 1547 11/11/20 2210 11/12/20 0040 11/12/20 0500  BP: 113/75 131/88 123/81 132/84  Pulse: 76 93 83 81  Resp: '18 18 20 ' (!) 84  Temp: 98.6 F (37 C) 98.5 F (36.9 C) 98 F (36.7 C) 97.8 F (36.6 C)  TempSrc: Axillary Axillary Oral Oral  SpO2:   100% 99%  Weight:      Height:       General: alert, cooperative and no distress Lochia: appropriate Uterine Fundus: firm Incision: N/A DVT  Evaluation: No evidence of DVT seen on physical exam. No cords or calf tenderness. No significant calf/ankle edema. Labs: Lab Results  Component Value Date   WBC 10.8 (H) 11/11/2020   HGB 8.0 (L) 11/11/2020   HCT 25.9 (L) 11/11/2020   MCV 72.8 (L) 11/11/2020   PLT 215 11/11/2020   CMP Latest Ref Rng & Units 07/14/2019  Glucose 70 - 99 mg/dL 97  BUN 6 - 20 mg/dL 7  Creatinine 0.44 - 1.00 mg/dL 0.49  Sodium 135 - 145 mmol/L 139  Potassium 3.5 - 5.1 mmol/L 3.9  Chloride 98 - 111 mmol/L 104  CO2 22 - 32 mmol/L 26  Calcium 8.9 - 10.3 mg/dL 9.2  Total Protein 6.5 - 8.1 g/dL 7.6  Total Bilirubin 0.3 - 1.2 mg/dL 0.1(L)  Alkaline Phos 38 - 126 U/L 54  AST 15 - 41 U/L 21  ALT 0 - 44 U/L 22   Edinburgh Score: Edinburgh Postnatal Depression Scale Screening Tool 11/12/2020  I have been able to laugh and see the funny side of things. (No Data)  I have looked forward with enjoyment to things. -  I have blamed myself unnecessarily when things went wrong. -  I have been anxious or worried for no good reason. -  I have felt scared or panicky for no good reason. -  Things have been getting on top of me. -  I  have been so unhappy that I have had difficulty sleeping. -  I have felt sad or miserable. -  I have been so unhappy that I have been crying. -  The thought of harming myself has occurred to me. Flavia Shipper Postnatal Depression Scale Total -      After visit meds:  Allergies as of 11/12/2020      Reactions   Contrast Media [iodinated Diagnostic Agents] Itching, Other (See Comments)   Itching on tongue      Medication List    STOP taking these medications   NIFEdipine 10 MG capsule Commonly known as: PROCARDIA     TAKE these medications   acetaminophen 325 MG tablet Commonly known as: Tylenol Take 2 tablets (650 mg total) by mouth every 4 (four) hours as needed (for pain scale < 4).   albuterol 108 (90 Base) MCG/ACT inhaler Commonly known as: VENTOLIN HFA Inhale into the  lungs every 4 (four) hours as needed for wheezing or shortness of breath.   calcium carbonate 500 MG chewable tablet Commonly known as: TUMS - dosed in mg elemental calcium Chew 1 tablet by mouth 5 (five) times daily as needed for indigestion or heartburn.   famotidine 20 MG tablet Commonly known as: Pepcid Take 1 tablet (20 mg total) by mouth 2 (two) times daily.   ferrous sulfate 325 (65 FE) MG tablet Take 1 tablet (325 mg total) by mouth daily with breakfast.   Fusion 65-65-25-30 MG Caps Take 1 capsule by mouth daily.   ibuprofen 600 MG tablet Commonly known as: ADVIL Take 1 tablet (600 mg total) by mouth every 6 (six) hours.        Discharge home in stable condition Infant Feeding: Breast Infant Disposition:home with mother Discharge instruction: per After Visit Summary and Postpartum booklet. Activity: Advance as tolerated. Pelvic rest for 6 weeks.  Diet: low salt diet and iron rich diet Anticipated Birth Control: Plans Interval BTL Postpartum Appointment:6 weeks Additional Postpartum F/U: none Future Appointments:No future appointments. Follow up Visit:  [facility]. Schedule an appointment as soon as possible for a visit in 6 week(s).   Specialty: Obstetrics and Gynecology Contact information: [address] [place] [address] [phone]                  11/12/2020 Jaivon Vanbeek B Quintasia Theroux, CNM

## 2020-11-12 NOTE — Progress Notes (Signed)
RN called to room, pt states experiencing SOB. VS WNL, pt used inhaler, feeling better. RN auscultated lungs, all lobes clear bilaterally. Pt instructed to call with further or worsening symptoms. Pt states "feeling better." Cardell Peach, RN

## 2020-11-13 ENCOUNTER — Ambulatory Visit: Payer: Self-pay

## 2020-11-13 NOTE — Lactation Note (Signed)
This note was copied from a baby's chart. Lactation Consultation Note  Patient Name: Anna Burns JMEQA'S Date: 11/13/2020   Age:30 Hours  Mother reports that she is still having slight nipple soreness on the left nipple. She reports that when continuing to turn the pump up it causes more pain. We discussed using the coconut oil and leaving the pump at standard for comfort.   Mother reports she is very excited to be giving her son breastmilk. She reports that she is the only one of her family that has  not breastfed her children. . Now that she is pumping to give breastmilk she feels good about herself.  Discussed treatment and prevention of engorgement. Mother reports that her breast feel slightly tender.  Advised to hand express before and after pumping as well and good breast massage.   Encouraged mother to follow up with [facility] as needed.    Maternal Data    Feeding    LATCH Score                    Lactation Tools Discussed/Used    Interventions    Discharge    Consult Status      [name] 11/13/2020, 2:45 PM

## 2020-11-14 ENCOUNTER — Ambulatory Visit: Payer: Self-pay

## 2020-11-14 NOTE — Lactation Note (Signed)
This note was copied from a baby's chart. Lactation Consultation Note  Patient Name: Anna Burns YOVZC'H Date: 11/14/2020 Reason for consult: Follow-up assessment;Early term 37-38.6wks Age:30 days  Follow up visit to 3 days old ETI infant with 4.88% weight loss at the time of visit. Mother is excited because she collected ~30 mL last pumping session. Infant just finished taking those 30 mL via bottle. Discussed breast changes, engorgement and how to use ice for relief as needed. Mother is massaging prior to pump.  LC reviewed pace bottle-feeding technique, upright position and frequent burping. Infant started showing cues and mother is supplementing with formula after EBM.  Reviewed maintenance mode pumping since mother's volume is transitioning. Mother is not planning to latch due to discomfort.    Feeding plan:  1-Skin to skin 2-Aim for a deep, comfortable latch 3-Breastfeeding on demand or 8-12 times in 24h period. 4-Keep infant awake during breastfeeding session: massaging breast, infant's hand/shoulder/feet 5-Pump and supplement following guidelines, paced bottle feeding and fullness cues.  6-Monitor voids and stools as signs good intake.  7-Encouraged maternal rest, hydration and food intake.  8-Contact LC as needed for feeds/support/concerns/questions   All questions answered at this time.   Feeding Mother's Current Feeding Choice: Breast Milk and Formula Nipple Type: Extra Slow Flow  Lactation Tools Discussed/Used Tools: Pump Breast pump type: Double-Electric Breast Pump Pump Education: Milk Storage Reason for Pumping: stimulation and supplementation Pumping frequency: Q3 Pumped volume: 30 mL  Interventions Interventions: Breast feeding basics reviewed;DEBP;Expressed milk;Education;Position options  Discharge Discharge Education: Engorgement and breast care;Warning signs for feeding baby  Consult Status Consult Status: Follow-up Date: 11/15/20 Follow-up  type: In-patient    Nikola Marone A Higuera Ancidey 11/14/2020, 10:40 PM

## 2021-09-24 ENCOUNTER — Emergency Department (HOSPITAL_COMMUNITY): Payer: Medicaid Other

## 2021-09-24 ENCOUNTER — Emergency Department (HOSPITAL_BASED_OUTPATIENT_CLINIC_OR_DEPARTMENT_OTHER): Payer: Medicaid Other

## 2021-09-24 ENCOUNTER — Encounter (HOSPITAL_BASED_OUTPATIENT_CLINIC_OR_DEPARTMENT_OTHER): Payer: Self-pay | Admitting: Emergency Medicine

## 2021-09-24 ENCOUNTER — Emergency Department (HOSPITAL_BASED_OUTPATIENT_CLINIC_OR_DEPARTMENT_OTHER)
Admission: EM | Admit: 2021-09-24 | Discharge: 2021-09-24 | Disposition: A | Discharge status: Home / Self Care | Payer: Medicaid Other | Attending: Emergency Medicine | Admitting: Emergency Medicine

## 2021-09-24 ENCOUNTER — Other Ambulatory Visit: Payer: Self-pay

## 2021-09-24 DIAGNOSIS — J45909 Unspecified asthma, uncomplicated: Secondary | ICD-10-CM | POA: Diagnosis not present

## 2021-09-24 DIAGNOSIS — H538 Other visual disturbances: Secondary | ICD-10-CM | POA: Diagnosis not present

## 2021-09-24 DIAGNOSIS — Z79899 Other long term (current) drug therapy: Secondary | ICD-10-CM | POA: Diagnosis not present

## 2021-09-24 DIAGNOSIS — D649 Anemia, unspecified: Secondary | ICD-10-CM | POA: Diagnosis not present

## 2021-09-24 DIAGNOSIS — H5500 Unspecified nystagmus: Secondary | ICD-10-CM | POA: Insufficient documentation

## 2021-09-24 DIAGNOSIS — R202 Paresthesia of skin: Secondary | ICD-10-CM

## 2021-09-24 DIAGNOSIS — Z20822 Contact with and (suspected) exposure to covid-19: Secondary | ICD-10-CM | POA: Insufficient documentation

## 2021-09-24 DIAGNOSIS — R200 Anesthesia of skin: Secondary | ICD-10-CM | POA: Diagnosis not present

## 2021-09-24 DIAGNOSIS — Y900 Blood alcohol level of less than 20 mg/100 ml: Secondary | ICD-10-CM | POA: Insufficient documentation

## 2021-09-24 DIAGNOSIS — R519 Headache, unspecified: Secondary | ICD-10-CM | POA: Diagnosis present

## 2021-09-24 LAB — COMPREHENSIVE METABOLIC PANEL
ALT: 14 U/L (ref 0–44)
AST: 16 U/L (ref 15–41)
Albumin: 4.4 g/dL (ref 3.5–5.0)
Alkaline Phosphatase: 40 U/L (ref 38–126)
Anion gap: 9 (ref 5–15)
BUN: 9 mg/dL (ref 6–20)
CO2: 25 mmol/L (ref 22–32)
Calcium: 9.7 mg/dL (ref 8.9–10.3)
Chloride: 105 mmol/L (ref 98–111)
Creatinine, Ser: 0.47 mg/dL (ref 0.44–1.00)
GFR, Estimated: >60 mL/min (ref >60)
Glucose, Bld: 106 mg/dL — ABNORMAL HIGH (ref 70–99)
Potassium: 3.6 mmol/L (ref 3.5–5.1)
Sodium: 139 mmol/L (ref 135–145)
Total Bilirubin: 0.4 mg/dL (ref 0.3–1.2)
Total Protein: 8 g/dL (ref 6.5–8.1)

## 2021-09-24 LAB — CBC
HCT: 37.7 % (ref 36.0–46.0)
Hemoglobin: 11.7 g/dL — ABNORMAL LOW (ref 12.0–15.0)
MCH: 24.2 pg — ABNORMAL LOW (ref 26.0–34.0)
MCHC: 31 g/dL (ref 30.0–36.0)
MCV: 78.1 fL — ABNORMAL LOW (ref 80.0–100.0)
Platelets: 322 10*3/uL (ref 150–400)
RBC: 4.83 MIL/uL (ref 3.87–5.11)
RDW: 14.1 % (ref 11.5–15.5)
WBC: 7.2 10*3/uL (ref 4.0–10.5)
nRBC: 0 % (ref 0.0–0.2)

## 2021-09-24 LAB — ETHANOL: Alcohol, Ethyl (B): <10 mg/dL (ref <10)

## 2021-09-24 LAB — PROTIME-INR
INR: 1 (ref 0.8–1.2)
Prothrombin Time: 12.9 seconds (ref 11.4–15.2)

## 2021-09-24 LAB — RAPID URINE DRUG SCREEN, HOSP PERFORMED
Amphetamines: NOT DETECTED
Barbiturates: NOT DETECTED
Benzodiazepines: NOT DETECTED
Cocaine: NOT DETECTED
Opiates: NOT DETECTED
Tetrahydrocannabinol: NOT DETECTED

## 2021-09-24 LAB — URINALYSIS, ROUTINE W REFLEX MICROSCOPIC
Bilirubin Urine: NEGATIVE
Glucose, UA: NEGATIVE mg/dL
Hgb urine dipstick: NEGATIVE
Ketones, ur: NEGATIVE mg/dL
Nitrite: NEGATIVE
Specific Gravity, Urine: 1.023 (ref 1.005–1.030)
pH: 7.5 (ref 5.0–8.0)

## 2021-09-24 LAB — DIFFERENTIAL
Abs Immature Granulocytes: 0.02 10*3/uL (ref 0.00–0.07)
Basophils Absolute: 0 10*3/uL (ref 0.0–0.1)
Basophils Relative: 1 %
Eosinophils Absolute: 0.2 10*3/uL (ref 0.0–0.5)
Eosinophils Relative: 2 %
Immature Granulocytes: 0 %
Lymphocytes Relative: 33 %
Lymphs Abs: 2.4 10*3/uL (ref 0.7–4.0)
Monocytes Absolute: 0.3 10*3/uL (ref 0.1–1.0)
Monocytes Relative: 5 %
Neutro Abs: 4.3 10*3/uL (ref 1.7–7.7)
Neutrophils Relative %: 59 %

## 2021-09-24 LAB — RESP PANEL BY RT-PCR (FLU A&B, COVID) ARPGX2
Influenza A by PCR: NEGATIVE
Influenza B by PCR: NEGATIVE
SARS Coronavirus 2 by RT PCR: NEGATIVE

## 2021-09-24 LAB — APTT: aPTT: 28 seconds (ref 24–36)

## 2021-09-24 LAB — PREGNANCY, URINE: Preg Test, Ur: NEGATIVE

## 2021-09-24 IMAGING — CT CT HEAD W/O CM
4 series · 16 of 47 positions shown, 18 images · non-contrast
Comparison: None.

CLINICAL DATA: Neuro deficit, acute, stroke suspected right sided
HA, with new Right eye blurriness, decreased sensation on the right
scalp, right pronator drift, and vertical nystagmus



[Series 2: head wo · axial · 0.42mm/px · z∈[-343,-228]mm · 7 of 31 slices shown, 9 images]
[im 4/31  brain]
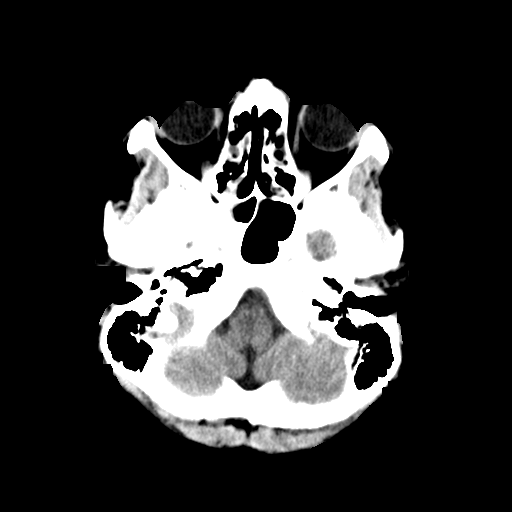
[im 4/31  bone]
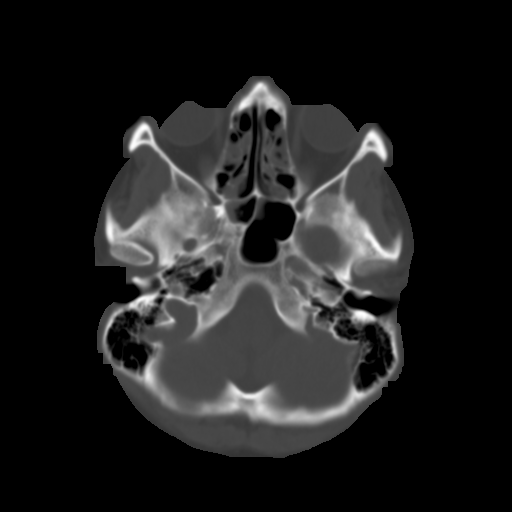
[im 8/31  brain]
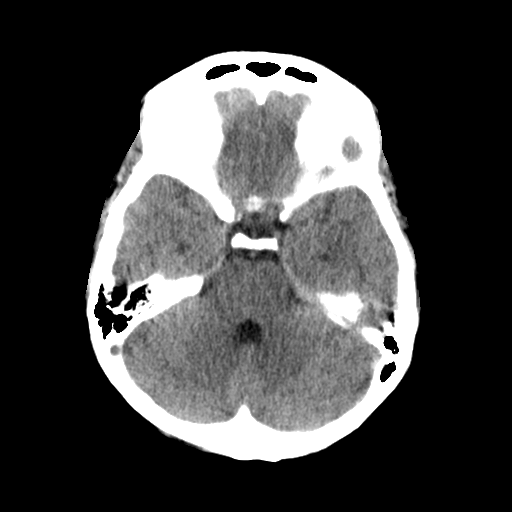
[im 12/31  brain]
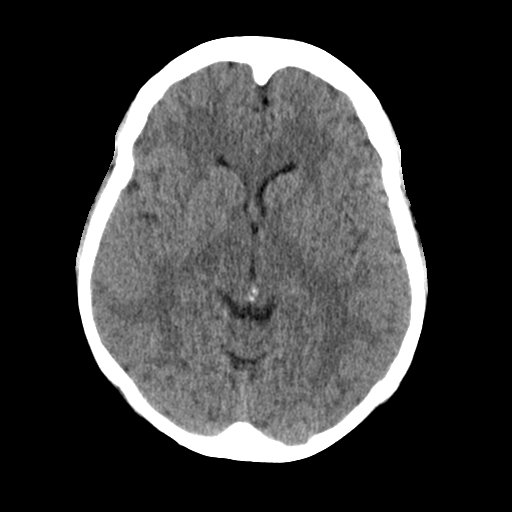
[im 16/31  brain]
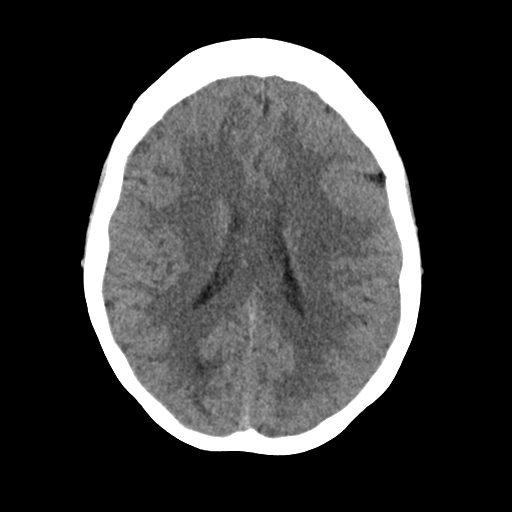
[im 19/31  brain]
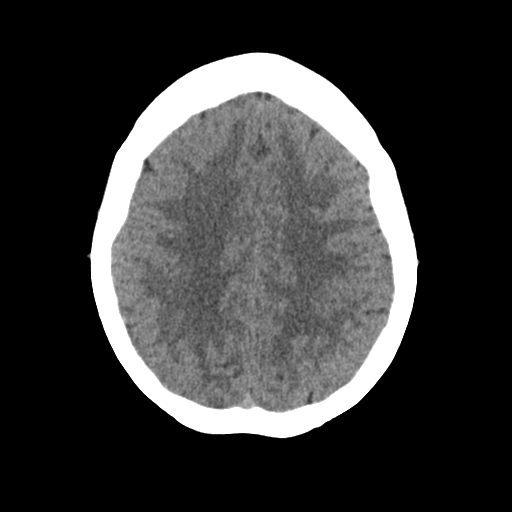
[im 19/31  bone]
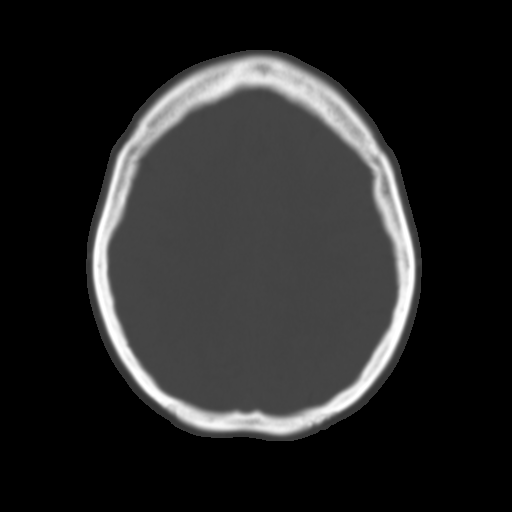
[im 23/31  brain]
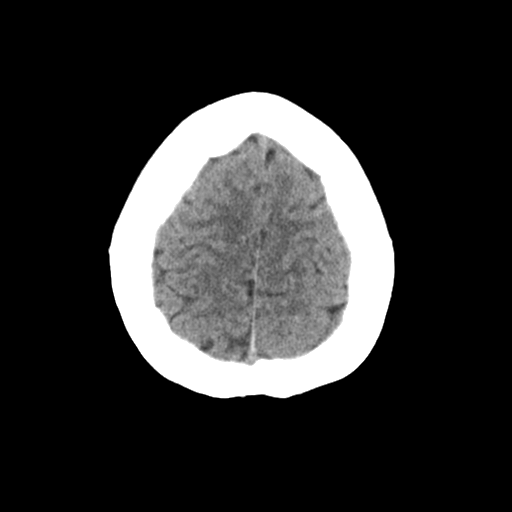
[im 27/31  brain]
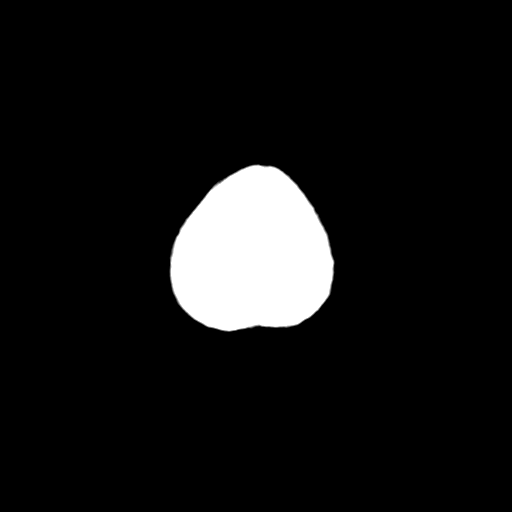

[Series 3: head bone · axial · 0.42mm/px · z∈[-344,-314]mm · 3 of 77 slices shown]
[im 8/77  bone]
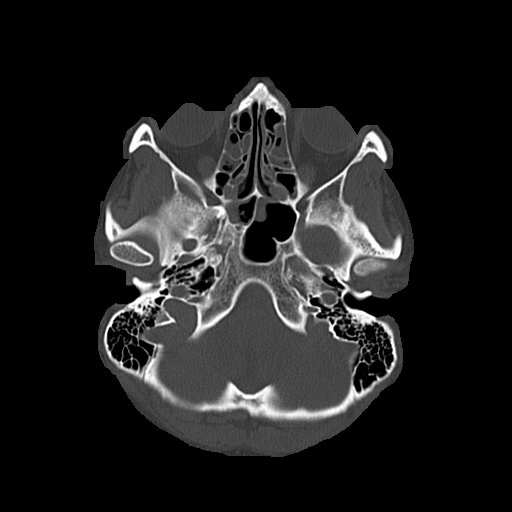
[im 16/77  bone]
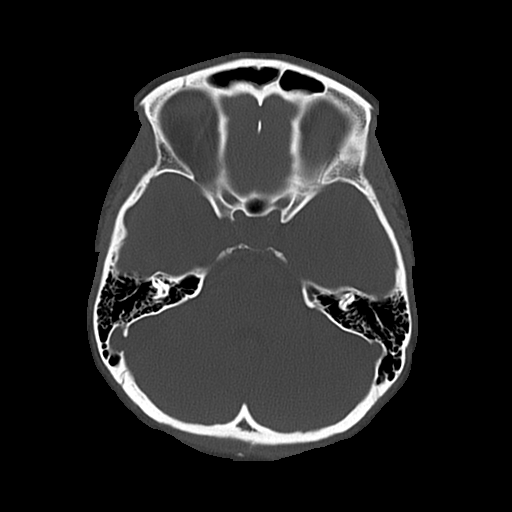
[im 23/77  bone]
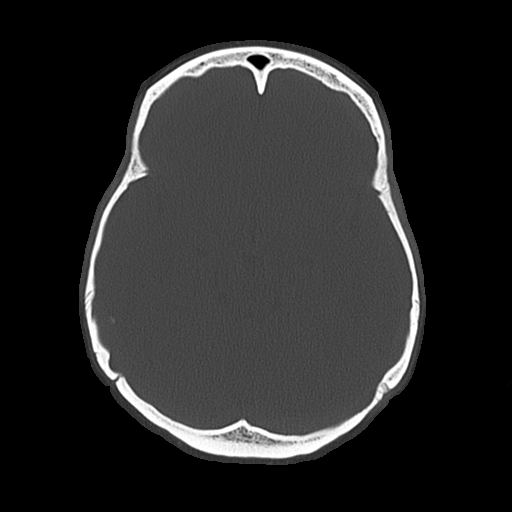

[Series 4: coronal soft · coronal · 0.30mm/px · 3 of 64 slices shown]
[im 22/64  brain]
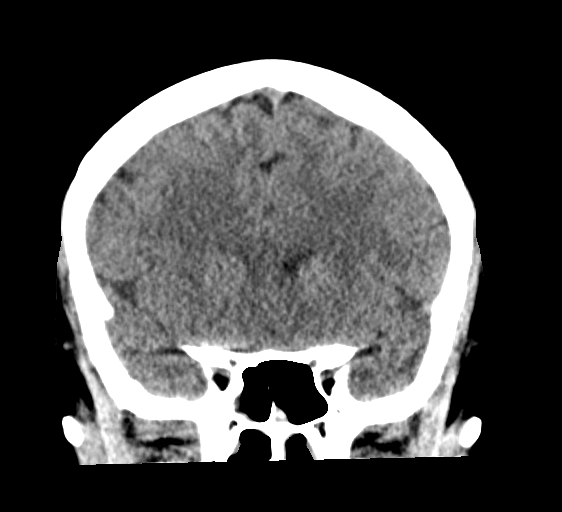
[im 29/64  brain]
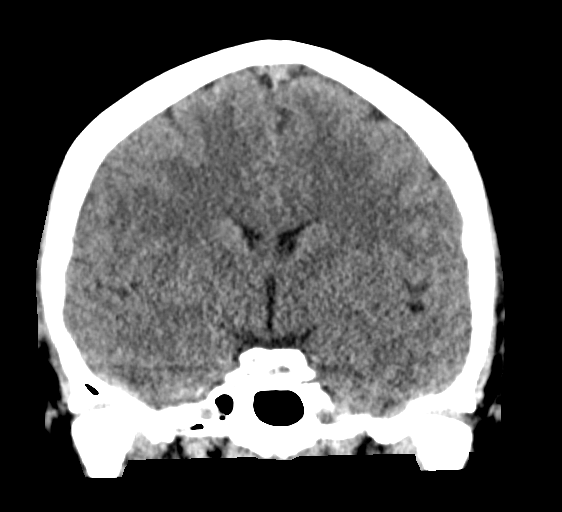
[im 36/64  brain]
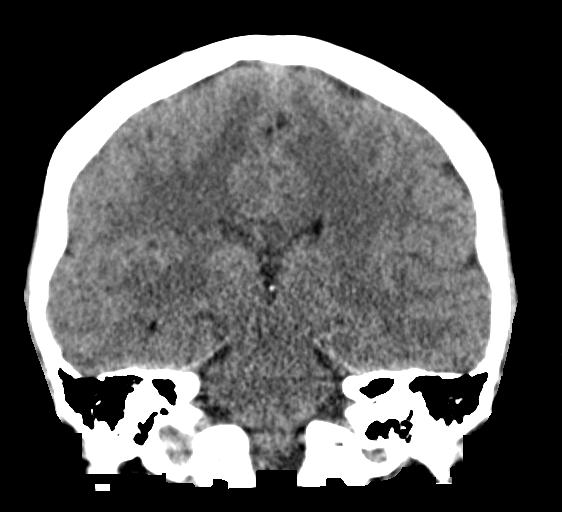

[Series 5: sagittal soft · sagittal · 0.30mm/px · 3 of 57 slices shown]
[im 19/57  brain]
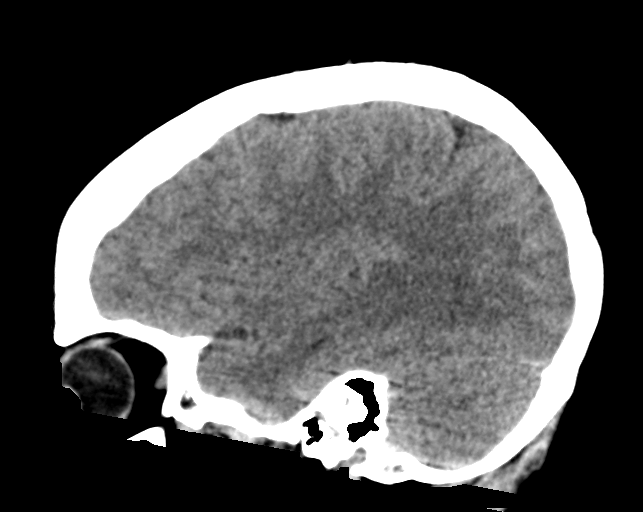
[im 29/57  brain]
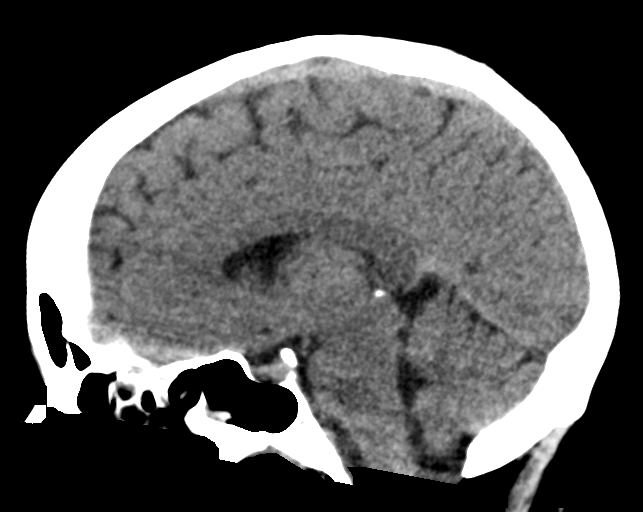
[im 38/57  brain]
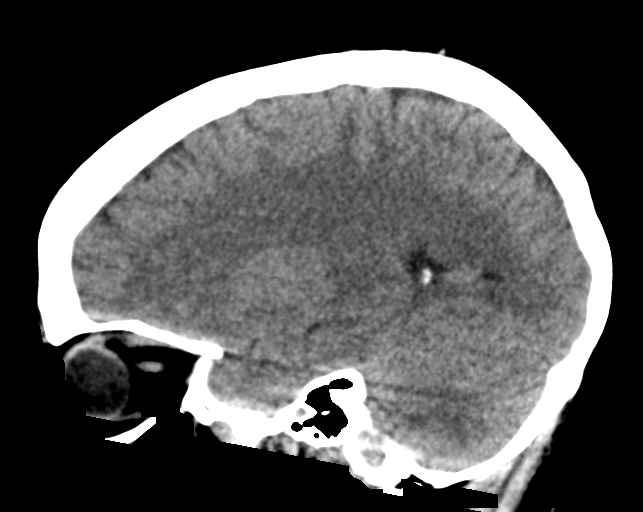

[16 of 47 positions shown; findings below may reference images not displayed]

FINDINGS: Brain: No evidence of acute large vascular territory infarction,
hemorrhage, hydrocephalus, extra-axial collection or mass
lesion/mass effect.

Vascular: No hyperdense vessel identified.

Skull: No acute fracture.

Sinuses/Orbits: Moderate paranasal sinus mucosal thickening with
frothy secretions.

Other: No mastoid effusions.
IMPRESSION: 1. No evidence of acute intracranial abnormality.
2. Moderate paranasal sinus disease.

## 2021-09-24 IMAGING — MR MR HEAD W/O CM
8 of 10 series · 35 of 48 positions shown · non-contrast
Comparison: No prior MRI, correlation is made with CT head
[DATE]

CLINICAL DATA: Stroke suspected, right-sided headache, right eye
blurriness, decreased sensation on right

EXAM:
MRI HEAD WITHOUT CONTRAST
MRA HEAD WITHOUT CONTRAST
TECHNIQUE: Multiplanar, multi-echo pulse sequences of the brain and surrounding
structures were acquired without intravenous contrast. Angiographic
images of the Circle of Willis were acquired using MRA technique
without intravenous contrast.

[Series 4: DWI · axial · 3.0mm · 1.09mm/px · z∈[-87,+60]mm · 9 of 102 slices shown (1 of 4)]
[im 1/102]
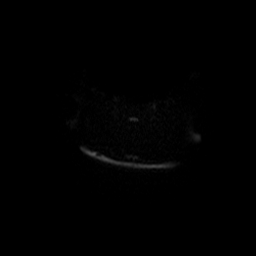
[im 13/102]
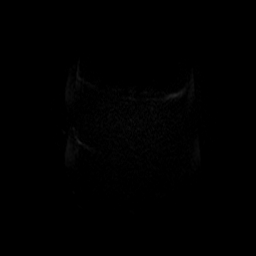
[im 26/102]
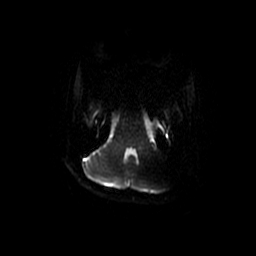
[im 38/102]
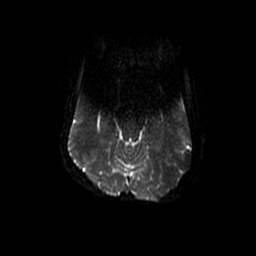
[im 51/102]
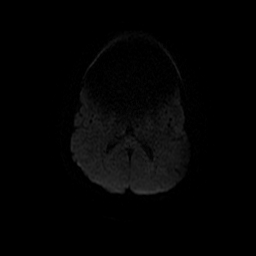
[im 64/102]
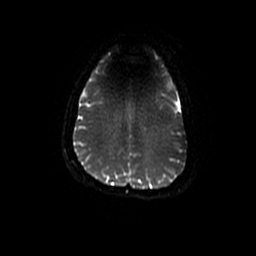
[im 76/102]
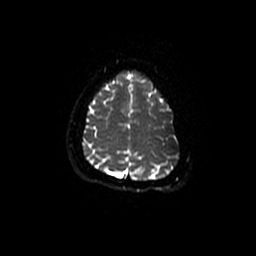
[im 89/102]
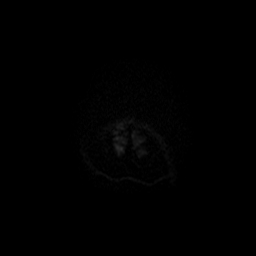
[im 102/102]
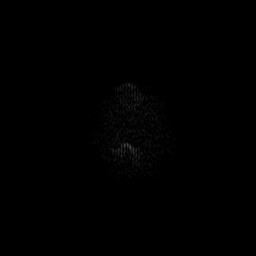

[Series 5: DWI · coronal · 5.0mm · 1.09mm/px · 6 of 74 slices shown (2 of 4)]
[im 1/74]
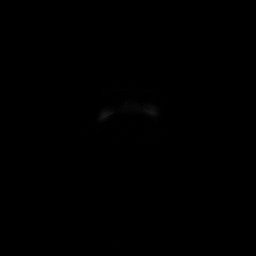
[im 15/74]
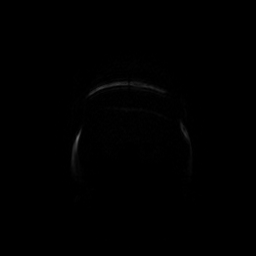
[im 30/74]
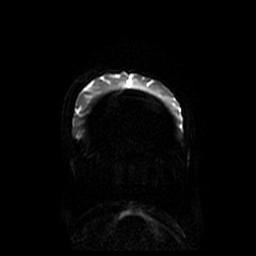
[im 44/74]
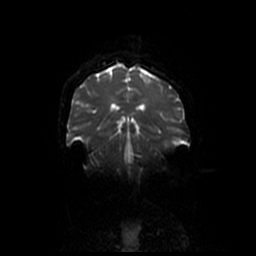
[im 59/74]
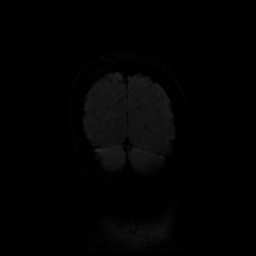
[im 74/74]
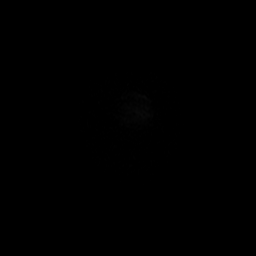

[Series 6: T1 · sagittal · 5.0mm · 0.47mm/px · 2 of 26 slices shown]
[im 1/26]
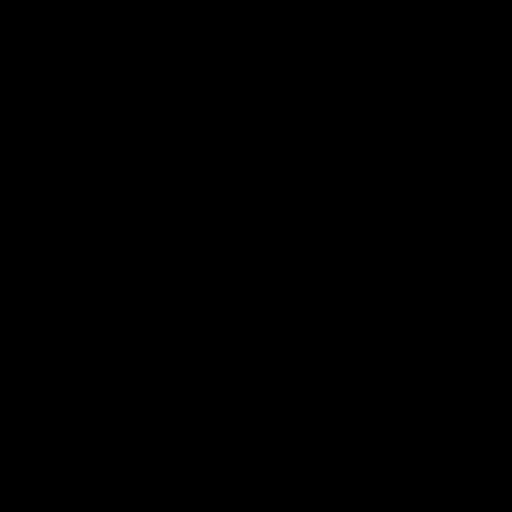
[im 13/26]
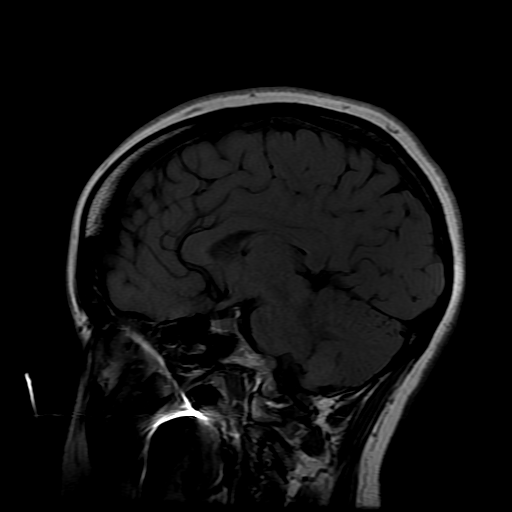

[Series 7: T2 · axial · 5.0mm · 0.43mm/px · z∈[-87,+57]mm · 3 of 26 slices shown (1 of 2)]
[im 1/26]
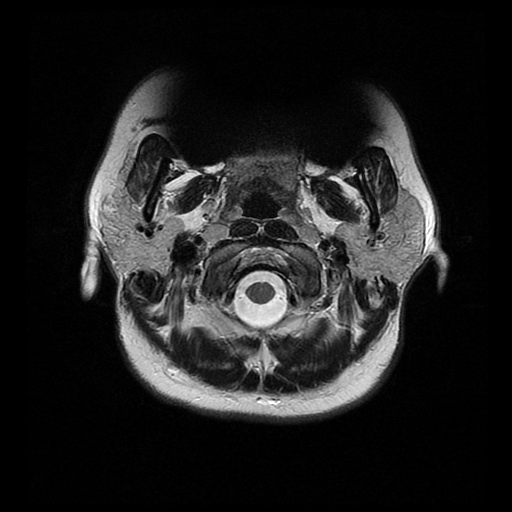
[im 13/26]
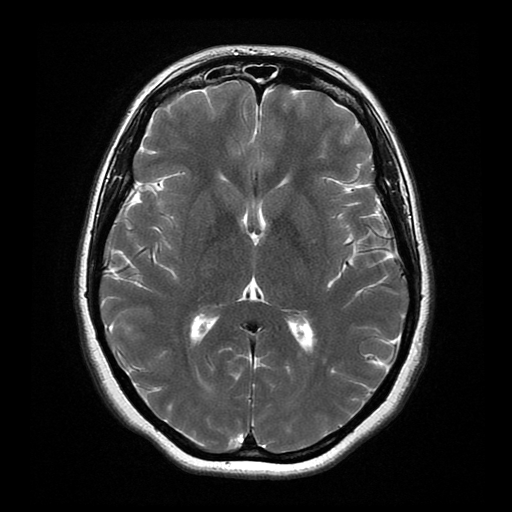
[im 26/26]
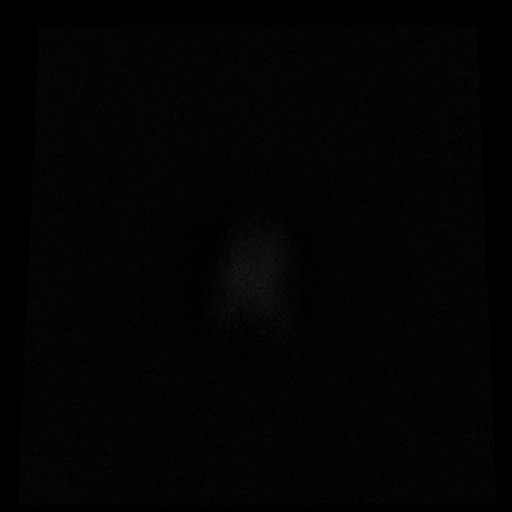

[Series 8: FLAIR · axial · 3.0mm · 0.43mm/px · z∈[-94,+49]mm · 3 of 26 slices shown]
[im 1/26]
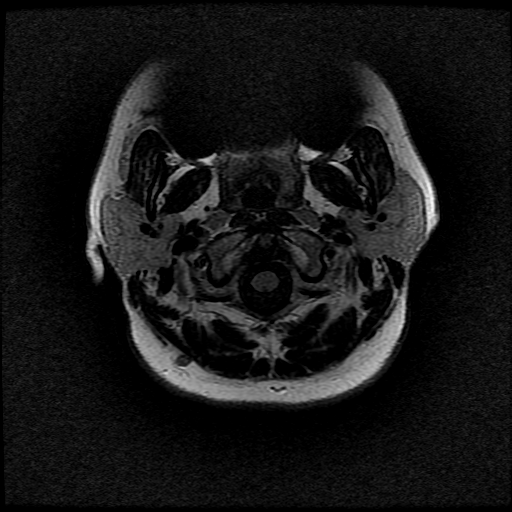
[im 13/26]
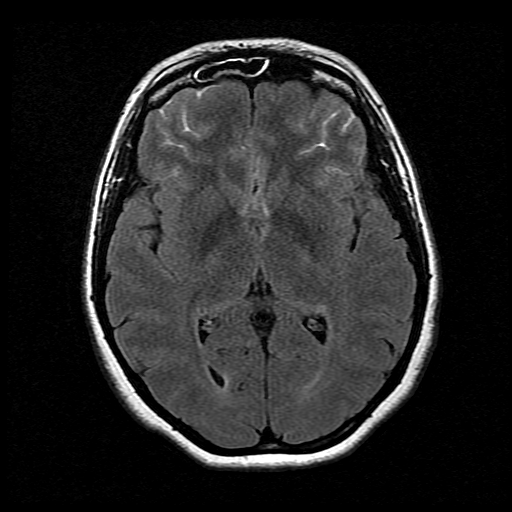
[im 26/26]
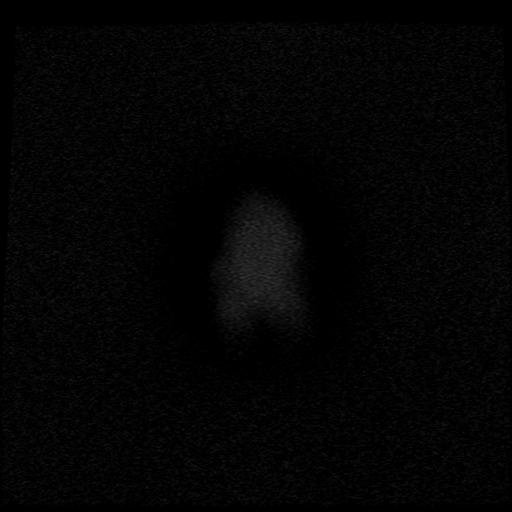

[Series 11: T2 · coronal · 5.0mm · 0.39mm/px · 3 of 28 slices shown (2 of 2)]
[im 1/28]
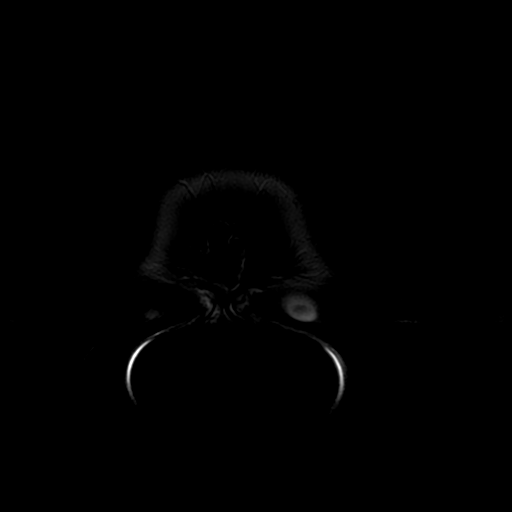
[im 14/28]
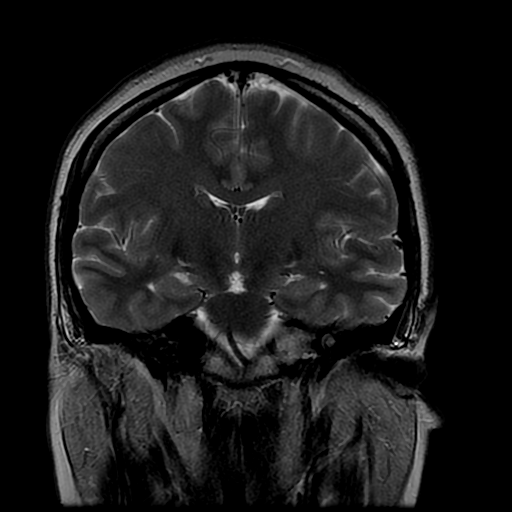
[im 28/28]
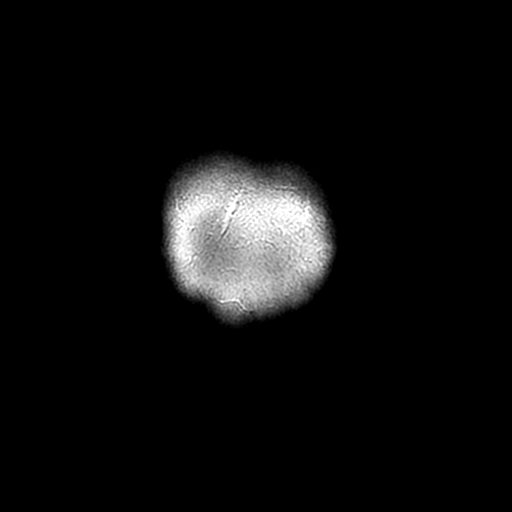

[Series 400: DWI · axial · 3.0mm · 1.09mm/px · z∈[-87,+60]mm · 5 of 51 slices shown (3 of 4)]
[im 1/51]
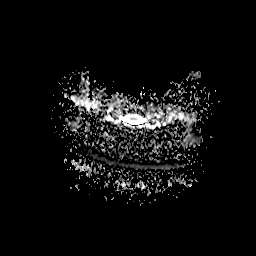
[im 13/51]
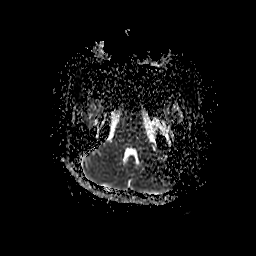
[im 26/51]
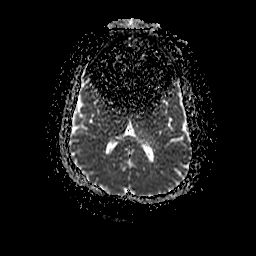
[im 38/51]
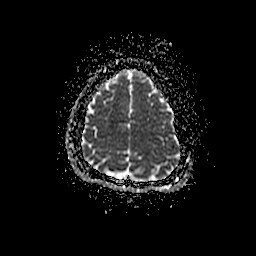
[im 51/51]
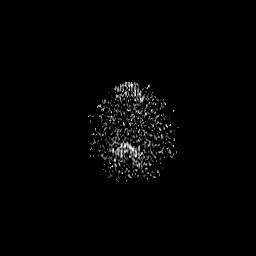

[Series 500: DWI · coronal · 5.0mm · 1.09mm/px · 4 of 37 slices shown (4 of 4)]
[im 1/37]
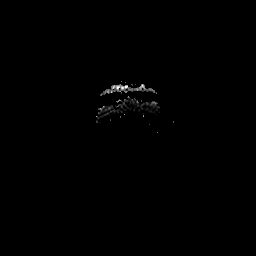
[im 13/37]
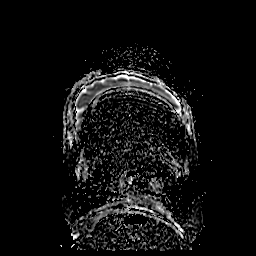
[im 25/37]
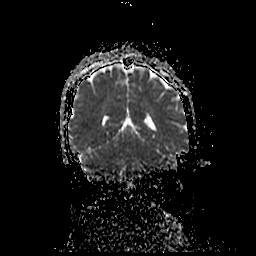
[im 37/37]
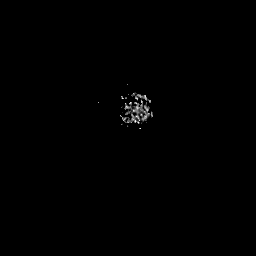

[35 of 48 positions shown; findings below may reference images not displayed]

FINDINGS: MRI HEAD FINDINGS

Brain: Evaluation is somewhat limited by susceptibility artifact
from the patient's dental hardware. Within this limitation, no
restricted diffusion to suggest acute or subacute infarct. No acute
hemorrhage, mass, mass effect, or midline shift. No hydrocephalus or
extra-axial collection.

Vascular: Normal flow voids.

Skull and upper cervical spine: Normal marrow signal.

Sinuses/Orbits: Mucosal thickening throughout the imaged paranasal
sinuses, although the maxillary sinuses can not be evaluated due to
susceptibility artifact. Visualized portions of the orbits are
unremarkable.

Other: The mastoids are well aerated.

MRA HEAD FINDINGS

Anterior circulation: Both internal carotid arteries are patent to
the termini, without significant stenosis.

A1 segments patent. Normal anterior communicating artery. Anterior
cerebral arteries are patent to their distal aspects.

No M1 stenosis or occlusion. Normal MCA bifurcations. Distal MCA
branches perfused and symmetric.

Posterior circulation: Vertebral arteries patent to the
vertebrobasilar junction without stenosis. Posterior inferior
cerebral arteries patent bilaterally.

Basilar patent to its distal aspect. Superior cerebellar arteries
patent bilaterally.

Patent P1 segments. PCAs perfused to their distal aspects without
stenosis. The bilateral posterior communicating arteries are patent.

Anatomic variants: None significant
IMPRESSION: 1. Evaluation is somewhat limited by susceptibility artifact from
the patient's dental hardware. Within this limitation, no acute
intracranial process.
2.  No intracranial large vessel occlusion or significant stenosis.

## 2021-09-24 IMAGING — MR MR MRA HEAD W/O CM
2 series · 19 of 48 positions shown · non-contrast
Comparison: No prior MRI, correlation is made with CT head
[DATE]

CLINICAL DATA: Stroke suspected, right-sided headache, right eye
blurriness, decreased sensation on right

EXAM:
MRI HEAD WITHOUT CONTRAST
MRA HEAD WITHOUT CONTRAST
TECHNIQUE: Multiplanar, multi-echo pulse sequences of the brain and surrounding
structures were acquired without intravenous contrast. Angiographic
images of the Circle of Willis were acquired using MRA technique
without intravenous contrast.

[Series 4: (id) mt fs · axial · 1.4mm · 0.43mm/px · z∈[-90,-5]mm · 18 of 136 slices shown]
[im 1/136]
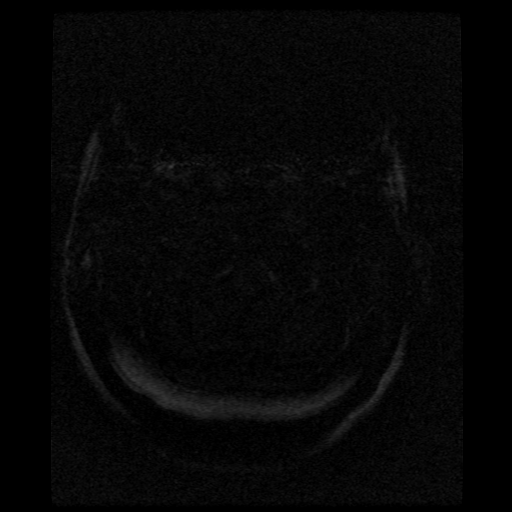
[im 3/136]
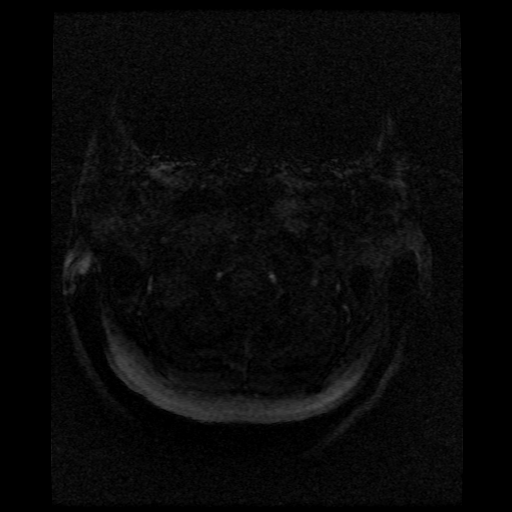
[im 6/136]
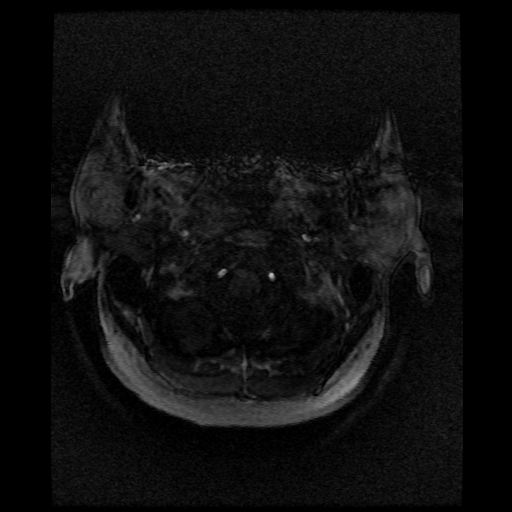
[im 9/136]
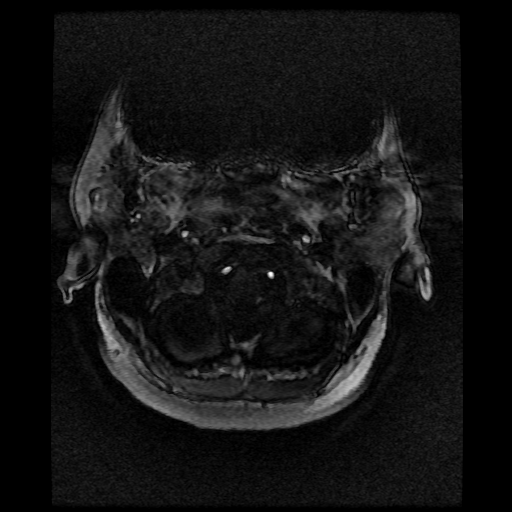
[im 12/136]
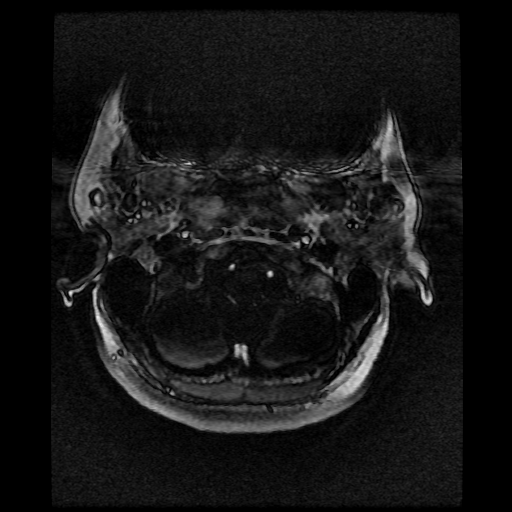
[im 15/136]
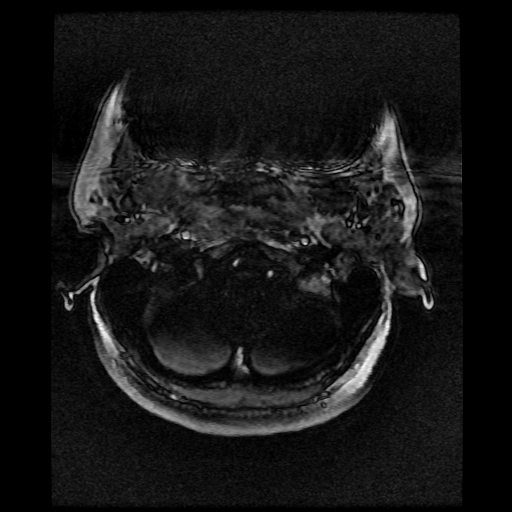
[im 18/136]
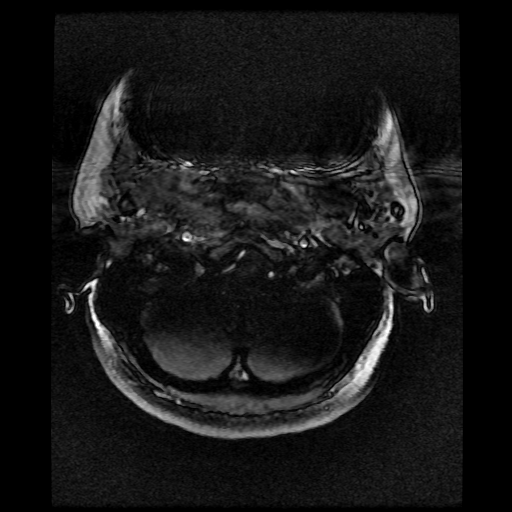
[im 21/136]
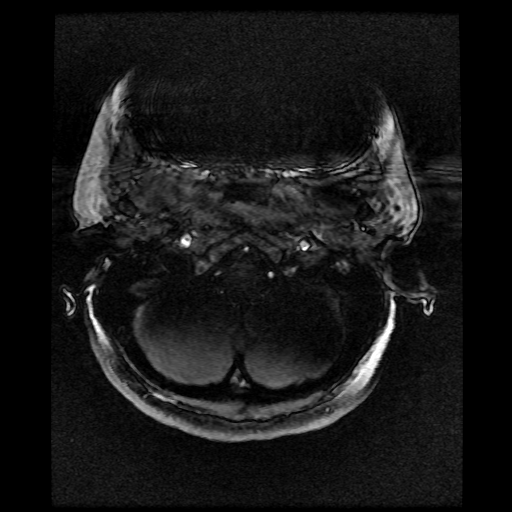
[im 24/136]
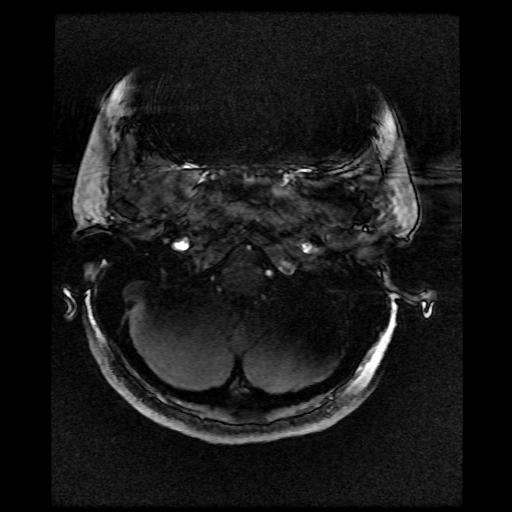
[im 27/136]
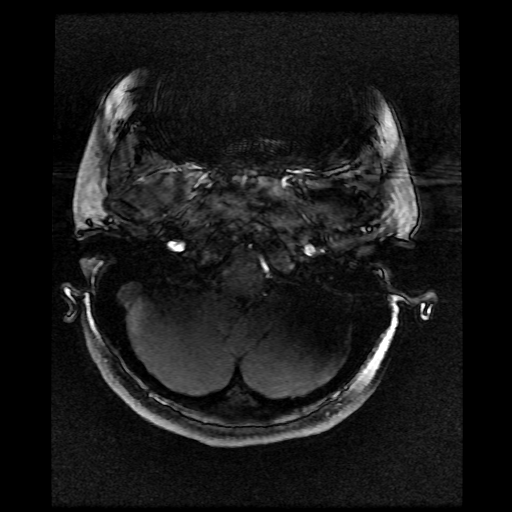
[im 42/136]
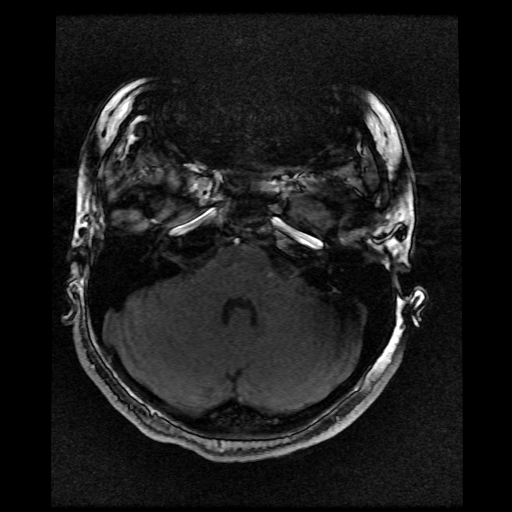
[im 59/136]
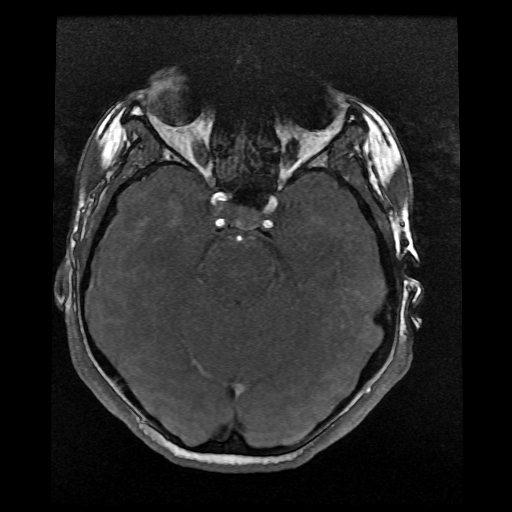
[im 68/136]
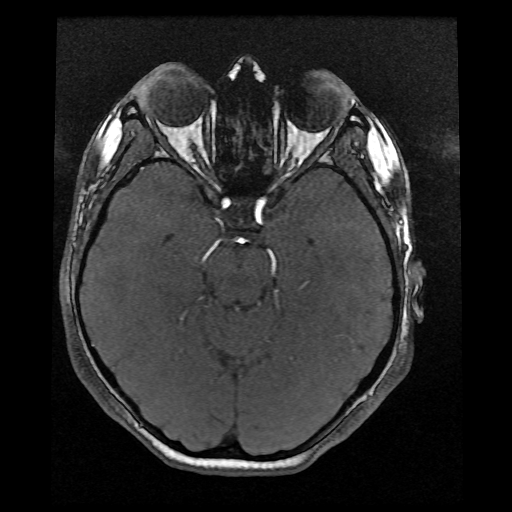
[im 77/136]
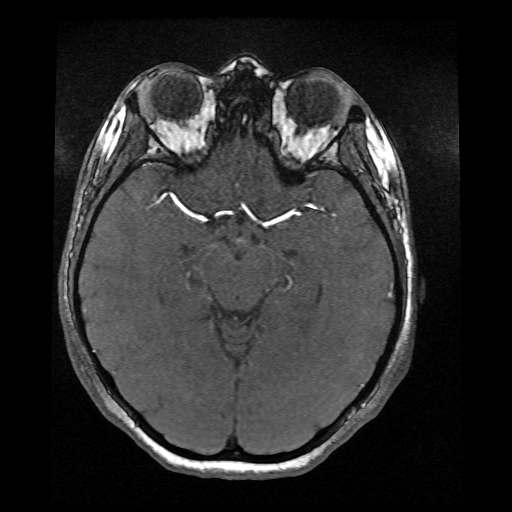
[im 94/136]
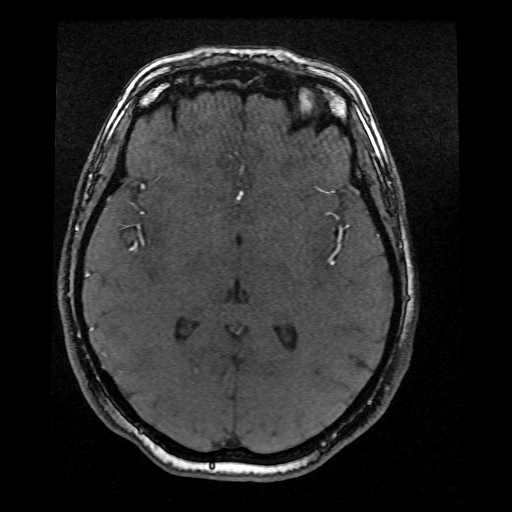
[im 112/136]
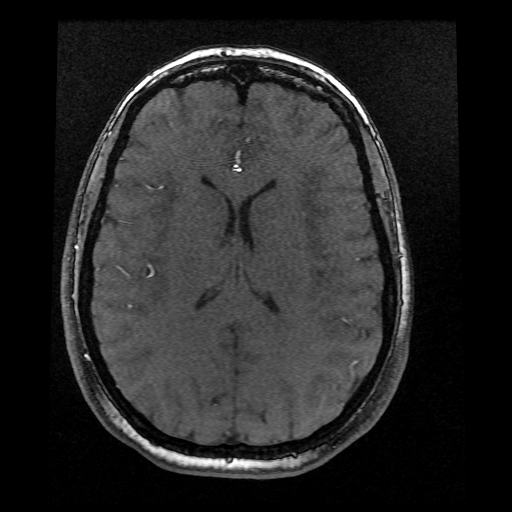
[im 115/136]
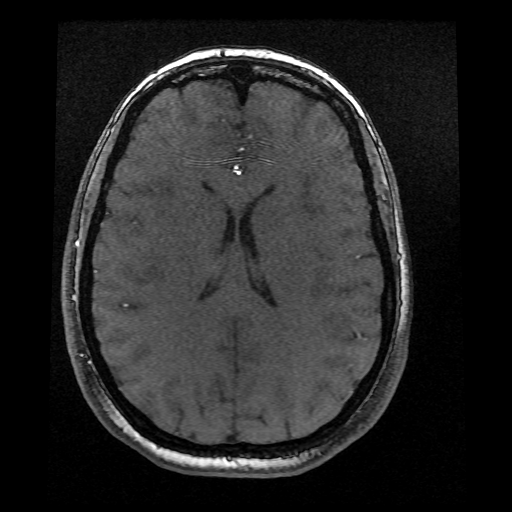
[im 130/136]
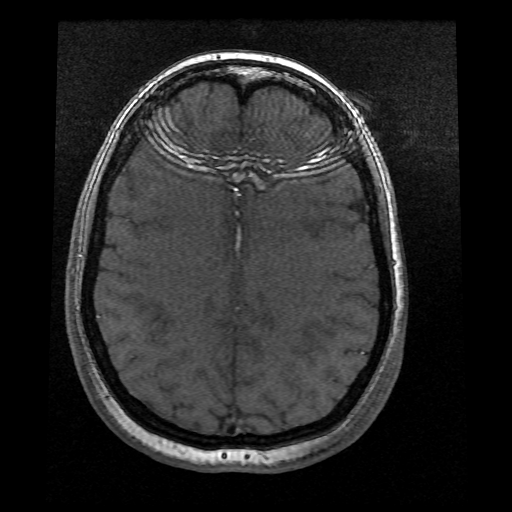

[Series 400: col:(id) mt fs · axial · 1.4mm · 0.43mm/px · 1 of 1 slices shown]
[im 1/1]
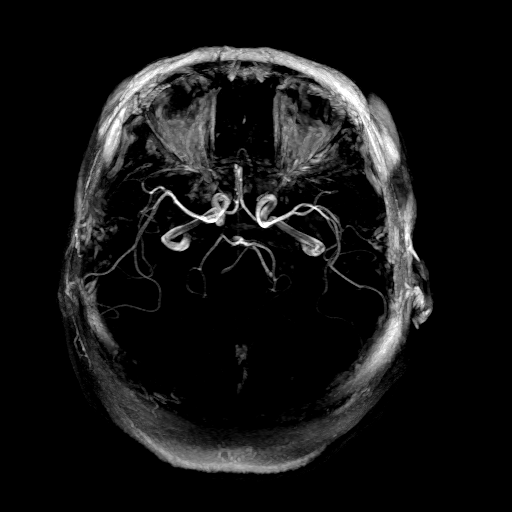

[19 of 48 positions shown; findings below may reference images not displayed]

FINDINGS: MRI HEAD FINDINGS

Brain: Evaluation is somewhat limited by susceptibility artifact
from the patient's dental hardware. Within this limitation, no
restricted diffusion to suggest acute or subacute infarct. No acute
hemorrhage, mass, mass effect, or midline shift. No hydrocephalus or
extra-axial collection.

Vascular: Normal flow voids.

Skull and upper cervical spine: Normal marrow signal.

Sinuses/Orbits: Mucosal thickening throughout the imaged paranasal
sinuses, although the maxillary sinuses can not be evaluated due to
susceptibility artifact. Visualized portions of the orbits are
unremarkable.

Other: The mastoids are well aerated.

MRA HEAD FINDINGS

Anterior circulation: Both internal carotid arteries are patent to
the termini, without significant stenosis.

A1 segments patent. Normal anterior communicating artery. Anterior
cerebral arteries are patent to their distal aspects.

No M1 stenosis or occlusion. Normal MCA bifurcations. Distal MCA
branches perfused and symmetric.

Posterior circulation: Vertebral arteries patent to the
vertebrobasilar junction without stenosis. Posterior inferior
cerebral arteries patent bilaterally.

Basilar patent to its distal aspect. Superior cerebellar arteries
patent bilaterally.

Patent P1 segments. PCAs perfused to their distal aspects without
stenosis. The bilateral posterior communicating arteries are patent.

Anatomic variants: None significant
IMPRESSION: 1. Evaluation is somewhat limited by susceptibility artifact from
the patient's dental hardware. Within this limitation, no acute
intracranial process.
2.  No intracranial large vessel occlusion or significant stenosis.

## 2021-09-24 NOTE — ED Notes (Signed)
MRI has been called and made aware that pt is here.  ?

## 2021-09-24 NOTE — ED Notes (Signed)
Patient returned from CT at this Time. ?

## 2021-09-24 NOTE — ED Triage Notes (Signed)
Pt states daily headaches x 1 month. 3 days ago started to have numbness on right side of head down to right eye. States it does not feel like pins and needles she can feel nothing. Also states blurred vision in right eye ?

## 2021-09-24 NOTE — ED Triage Notes (Signed)
Pt states she was on a weight loss pill for 30 days and stopped taking last week and the day after is when the numbness started.  ?

## 2021-09-24 NOTE — ED Provider Notes (Cosign Needed)
[facility] Provider Note   CSN: [id number] Arrival date & time: 09/24/21  1219     History  Chief Complaint  Patient presents with   Numbness   Migraine    Anna Burns is a 31 y.o. female  With history of migraines who presents with concern for worsening headaches over the last few weeks.  She states that 3 days ago she began to experience decree sensation in the right scalp starting beneath her eye of her scalp to the ear.  Yesterday morning at 8 AM she woke up and noted that her visual acuity in the right eye was significantly decreased from her baseline even with new set of contacts.  She states that her mother has history of multiple cerebral aneurysms that have required stenting as well as reported history of tumors.  States that her PCP was concerned about her symptoms and directed her to the emergency department.  I personally reviewed her medical records.  She has history of hypertension during pregnancy, herpes simplex, asthma, and iron deficiency anemia.  She did recently take phentermine for a few weeks and stopped cold [place] as she did not like how it was affecting her mood.  She states that she felt like she went through "withdrawals" for the first 2 days after she stopped the medication and her symptoms began a few days after that. Last known normal was Friday night (60 hours ago).  HPI     Home Medications Prior to Admission medications   Medication Sig Start Date End Date Taking? Authorizing Provider  acetaminophen (TYLENOL) 325 MG tablet Take 2 tablets (650 mg total) by mouth every 4 (four) hours as needed (for pain scale < 4). 11/12/20   Arrie Eastern, CNM  albuterol (VENTOLIN HFA) 108 (90 Base) MCG/ACT inhaler Inhale into the lungs every 4 (four) hours as needed for wheezing or shortness of breath.    [provider]  calcium carbonate (TUMS - DOSED IN MG ELEMENTAL CALCIUM) 500 MG chewable tablet Chew 1 tablet by mouth 5  (five) times daily as needed for indigestion or heartburn.    [provider]  famotidine (PEPCID) 20 MG tablet Take 1 tablet (20 mg total) by mouth 2 (two) times daily. 10/16/20   Gabriel Carina, CNM  Fe Fum-Fe Poly-Vit C-Lactobac (FUSION) 65-65-25-30 MG CAPS Take 1 capsule by mouth daily. 03/24/19   [provider]  ferrous sulfate 325 (65 FE) MG tablet Take 1 tablet (325 mg total) by mouth daily with breakfast. 04/25/18   Marikay Alar, CNM  ibuprofen (ADVIL) 600 MG tablet Take 1 tablet (600 mg total) by mouth every 6 (six) hours. 11/12/20   Arrie Eastern, CNM      Allergies    Contrast media [iodinated contrast media]    Review of Systems   Review of Systems  Constitutional: Negative.   HENT: Negative.    Eyes:  Positive for photophobia and visual disturbance. Negative for redness.  Respiratory: Negative.    Cardiovascular: Negative.   Gastrointestinal: Negative.   Musculoskeletal: Negative.   Neurological:  Positive for numbness and headaches. Negative for dizziness and facial asymmetry.   Physical Exam Updated Vital Signs BP 108/90 (BP Location: Right Arm)    Pulse 84    Temp 97.9 F (36.6 C)    Resp 20    Ht '5\' 3"'$  (1.6 m)    Wt 74.8 kg    LMP 09/17/2021 (Approximate)    SpO2 99%  BMI 29.23 kg/m  Physical Exam Vitals and nursing note reviewed.  Constitutional:      Appearance: She is not ill-appearing or toxic-appearing.  HENT:     Head: Normocephalic and atraumatic.     Nose: Nose normal.     Mouth/Throat:     Mouth: Mucous membranes are moist.     Pharynx: No oropharyngeal exudate or posterior oropharyngeal erythema.  Eyes:     General: Lids are normal. Vision grossly intact. No visual field deficit.       Right eye: No discharge.        Left eye: No discharge.     Extraocular Movements: Extraocular movements intact.     Right eye: Nystagmus present.     Left eye: Nystagmus present.     Conjunctiva/sclera: Conjunctivae normal.     Pupils: Pupils  are equal, round, and reactive to light.     Visual Fields: Right eye visual fields normal and left eye visual fields normal.     Comments: Vertical nystagmus in both eyes during EOM exam only in the left superior visual field  Left eye visual acuity 20/15 Right eye visual acuity 20/20 Bilateral visual acuity 20/13  Neck:     Trachea: Trachea and phonation normal.  Cardiovascular:     Rate and Rhythm: Normal rate and regular rhythm.     Pulses: Normal pulses.     Heart sounds: Normal heart sounds. No murmur heard. Pulmonary:     Effort: Pulmonary effort is normal. No tachypnea, bradypnea, accessory muscle usage, prolonged expiration or respiratory distress.     Breath sounds: Normal breath sounds. No wheezing or rales.  Chest:     Chest wall: No mass, lacerations, deformity, swelling, tenderness, crepitus or edema.  Abdominal:     General: Bowel sounds are normal. There is no distension.     Palpations: Abdomen is soft.     Tenderness: There is no abdominal tenderness. There is no right CVA tenderness, left CVA tenderness, guarding or rebound.  Musculoskeletal:        General: No deformity.     Cervical back: Normal range of motion and neck supple.  Lymphadenopathy:     Cervical: No cervical adenopathy.  Skin:    General: Skin is warm and dry.     Capillary Refill: Capillary refill takes less than 2 seconds.     Findings: No rash.  Neurological:     General: No focal deficit present.     Mental Status: She is alert. Mental status is at baseline.     GCS: GCS eye subscore is 4. GCS verbal subscore is 5. GCS motor subscore is 6.     Cranial Nerves: No dysarthria or facial asymmetry.     Sensory: Sensory deficit present.     Motor: Pronator drift present. No weakness, atrophy or seizure activity.     Coordination: Coordination is intact. Coordination normal. Finger-Nose-Finger Test and Heel to [facility] Test normal.     Gait: Gait is intact.     Comments: Decreased sensation on the  right forehead to the.  Midline, extending of the scalp in the C2 dermatome.  Symmetric strength and sensation in upper and lower extremities bilaterally with normal fine motor movement in the hands.  Patient does have right-sided pronator drift halfway to the bed  Last known normal 60 hours prior to arrival (Friday night).  Psychiatric:        Mood and Affect: Mood normal.    ED Results / Procedures /  Treatments   Labs (all labs ordered are listed, but only abnormal results are displayed) Labs Reviewed - No data to display  EKG None  Radiology No results found.  Procedures Procedures    Medications Ordered in ED Medications - No data to display  ED Course/ Medical Decision Making/ A&P                           Medical Decision Making 31 year old female who presents with headaches, decree sensation in the right forehead, and reportedly blurry vision in the right eye.  History of migraines, family history of intracranial aneurysms.  Vital signs are normal and intake.  Cardiopulmonary exam is normal, abdominal exam is benign.  Patient with neurologic exam that revealed decree sensation in the right forehead and anterior scalp, vertical nystagmus of the eyes in the left upper visual field, and notable right pronator drift on exam.  DDX includes but is not limited to intracranial hemorrhage or mass, CVA, aneurysm, complex migraine.   Amount and/or Complexity of Data Reviewed Labs: ordered.    Details: CBC no leukocytosis but with mild anemia with hemoglobin of 11.7 improved from the patient's baseline.  CMP unremarkable, UA unremarkable, pregnancy test is negative.  UDS negative and ethanol level is normal. Radiology: ordered.    Details: CT of the head negative for acute intracranial abnormality.    Case discussed with Dr. Kathrynn Speed who requests transfer to [facility] for MR brain and orbits with contrast as well as MRA head and neck.  (Unable to perform CT  angiogram here due to patient's allergy to iodinated contrast media.).  On repeat neurologic exam patient has consistent findings as above.  She is agreeable to plan for transfer to [facility] for further evaluation. Anna Burns voiced understanding of her medical evaluation and treatment plan.  Each of her questions answered to her expressed satisfaction.  Return precautions were given.  Patient is well-appearing, stable, and was transferred in good condition.  Patient discussed with ED attending physician Dr. Francia Greaves at [facility]; he is agreeable to excepting this patient to his service.  This chart was dictated using voice recognition software, Dragon. Despite the best efforts of this provider to proofread and correct errors, errors may still occur which can change documentation meaning.   Final Clinical Impression(s) / ED Diagnoses Final diagnoses:  None    Rx / DC Orders ED Discharge Orders     None         Emeline Darling, PA-C 09/24/21 1709

## 2021-09-24 NOTE — Progress Notes (Signed)
Brief Phone discussion with Dr. Matilde Sprang. ? ?Got a call about [name]. She had trouble getting through the MRIs but was able to get MRI Brain and MRA head w/o contrast. Unfortunately was unable to tolerate MR angio neck. Patient denies any symptoms and adamant about going home. ? ?Reviewed MRIs with no demyelinating lesions, no intracranial athersclerosis. ? ?Discussed over phone and recommend getting Vasc US carotid duplex outpatient with close follow up with neurology outpatient and strict return precautions if any new focal deficit(concerning for stroke). ? ?Donnetta Simpers ?[facility] ?Pager Number [phone] ?

## 2021-09-24 NOTE — ED Notes (Signed)
Care Handoff provided to [facility] at this Time. All Questions answered. ?

## 2021-09-24 NOTE — ED Notes (Signed)
[facility] at the Bedside. 

## 2021-09-24 NOTE — ED Notes (Signed)
Patient transported to CT at this Time. ?

## 2021-09-24 NOTE — ED Notes (Signed)
Patient Care Handoff provided to Adella Hare., RN at this Time. All Questions answered. ?

## 2021-09-24 NOTE — Discharge Instructions (Addendum)
You were seen in the emergency department for evaluation of numbness in the distribution of V1.  Your MRIs and work-up so far been negative but we were unable to complete the MRI imaging due to your request to not stay in the scanner this evening.  I spoke with the neurology team who is recommending that you get an ultrasound done in the outpatient setting and have very close neurology follow-up.  Please call [place] neurology tomorrow morning first thing.  If your numbness gets any worse, spreads across midline or spreads below the cheek please return to the emergency department immediately. ?

## 2021-09-24 NOTE — ED Notes (Signed)
Patient made aware of Transfer to [facility]; patient verbally consents to Transfer at this Time. ?

## 2021-09-24 NOTE — ED Notes (Signed)
PA at the Bedside. ?

## 2021-09-25 NOTE — ED Provider Notes (Signed)
?  Physical Exam  ?BP (!) 127/93 (BP Location: Left Arm)   Pulse 80   Temp 98.1 ?F (36.7 ?C) (Oral)   Resp 16   Ht '5\' 3"'$  (1.6 m)   Wt 74.8 kg   LMP 09/17/2021 (Approximate)   SpO2 100%   BMI 29.23 kg/m?  ? ?Physical Exam ?Vitals and nursing note reviewed.  ?Constitutional:   ?   General: She is not in acute distress. ?   Appearance: She is well-developed.  ?HENT:  ?   Head: Normocephalic and atraumatic.  ?Eyes:  ?   Conjunctiva/sclera: Conjunctivae normal.  ?Cardiovascular:  ?   Rate and Rhythm: Normal rate and regular rhythm.  ?   Heart sounds: No murmur heard. ?Pulmonary:  ?   Effort: Pulmonary effort is normal. No respiratory distress.  ?   Breath sounds: Normal breath sounds.  ?Abdominal:  ?   Palpations: Abdomen is soft.  ?   Tenderness: There is no abdominal tenderness.  ?Musculoskeletal:     ?   General: No swelling.  ?   Cervical back: Neck supple.  ?Skin: ?   General: Skin is warm and dry.  ?   Capillary Refill: Capillary refill takes less than 2 seconds.  ?Neurological:  ?   Mental Status: She is alert.  ?   Sensory: Sensory deficit (numbness in V1, left) present.  ?Psychiatric:     ?   Mood and Affect: Mood normal.  ? ? ?Procedures  ?Procedures ? ?ED Course / MDM  ?  ?Medical Decision Making ?Amount and/or Complexity of Data Reviewed ?Labs: ordered. ?Radiology: ordered. ? ? ?Patient received as a transfer for left-sided numbness and visual deficits, need for MRI.  Patient received MRI brain and MRA brain, but while in the scanner, requested to terminate the exam before the MRI orbits and neck could be performed as she states "she did not want to lay there for another 2 hours".  I consulted neurology about this and they recommend completion of MRIs, but if the patient must leave then she will need to follow-up closely outpatient with [facility] neurology, received a carotid duplex in the outpatient setting and have very strict return precautions.  I voiced all of this to the patient of which she  voiced understanding and she was discharged with close neurology follow-up. ? ? ? ? ?  ?Teressa Lower, MD ?09/25/21 0010 ? ?

## 2021-09-26 ENCOUNTER — Emergency Department: Payer: Medicaid Other

## 2021-09-26 ENCOUNTER — Other Ambulatory Visit: Payer: Self-pay

## 2021-09-26 ENCOUNTER — Emergency Department
Admission: EM | Admit: 2021-09-26 | Discharge: 2021-09-27 | Disposition: A | Discharge status: Home / Self Care | Payer: Medicaid Other | Attending: Emergency Medicine | Admitting: Emergency Medicine

## 2021-09-26 DIAGNOSIS — R200 Anesthesia of skin: Secondary | ICD-10-CM | POA: Insufficient documentation

## 2021-09-26 DIAGNOSIS — R079 Chest pain, unspecified: Secondary | ICD-10-CM | POA: Insufficient documentation

## 2021-09-26 LAB — BASIC METABOLIC PANEL
Anion gap: 9 (ref 5–15)
BUN: 8 mg/dL (ref 6–20)
CO2: 24 mmol/L (ref 22–32)
Calcium: 9 mg/dL (ref 8.9–10.3)
Chloride: 104 mmol/L (ref 98–111)
Creatinine, Ser: 0.54 mg/dL (ref 0.44–1.00)
GFR, Estimated: >60 mL/min (ref >60)
Glucose, Bld: 129 mg/dL — ABNORMAL HIGH (ref 70–99)
Potassium: 3.9 mmol/L (ref 3.5–5.1)
Sodium: 137 mmol/L (ref 135–145)

## 2021-09-26 LAB — CBC
HCT: 38.7 % (ref 36.0–46.0)
Hemoglobin: 12 g/dL (ref 12.0–15.0)
MCH: 24.5 pg — ABNORMAL LOW (ref 26.0–34.0)
MCHC: 31 g/dL (ref 30.0–36.0)
MCV: 79 fL — ABNORMAL LOW (ref 80.0–100.0)
Platelets: 357 10*3/uL (ref 150–400)
RBC: 4.9 MIL/uL (ref 3.87–5.11)
RDW: 14 % (ref 11.5–15.5)
WBC: 9.6 10*3/uL (ref 4.0–10.5)
nRBC: 0 % (ref 0.0–0.2)

## 2021-09-26 LAB — TROPONIN I (HIGH SENSITIVITY): Troponin I (High Sensitivity): 3 ng/L (ref <18)

## 2021-09-26 IMAGING — CR DG CHEST 2V
1 series · 2 of 2 positions shown · non-contrast
Comparison: Chest x-ray dated [DATE].

CLINICAL DATA: Chest pain and shortness of breath.

EXAM:
CHEST - 2 VIEW

[Series 1: dg chest 2 view · 0.14mm/px · 2 of 2 slices shown]
[im 1/2]
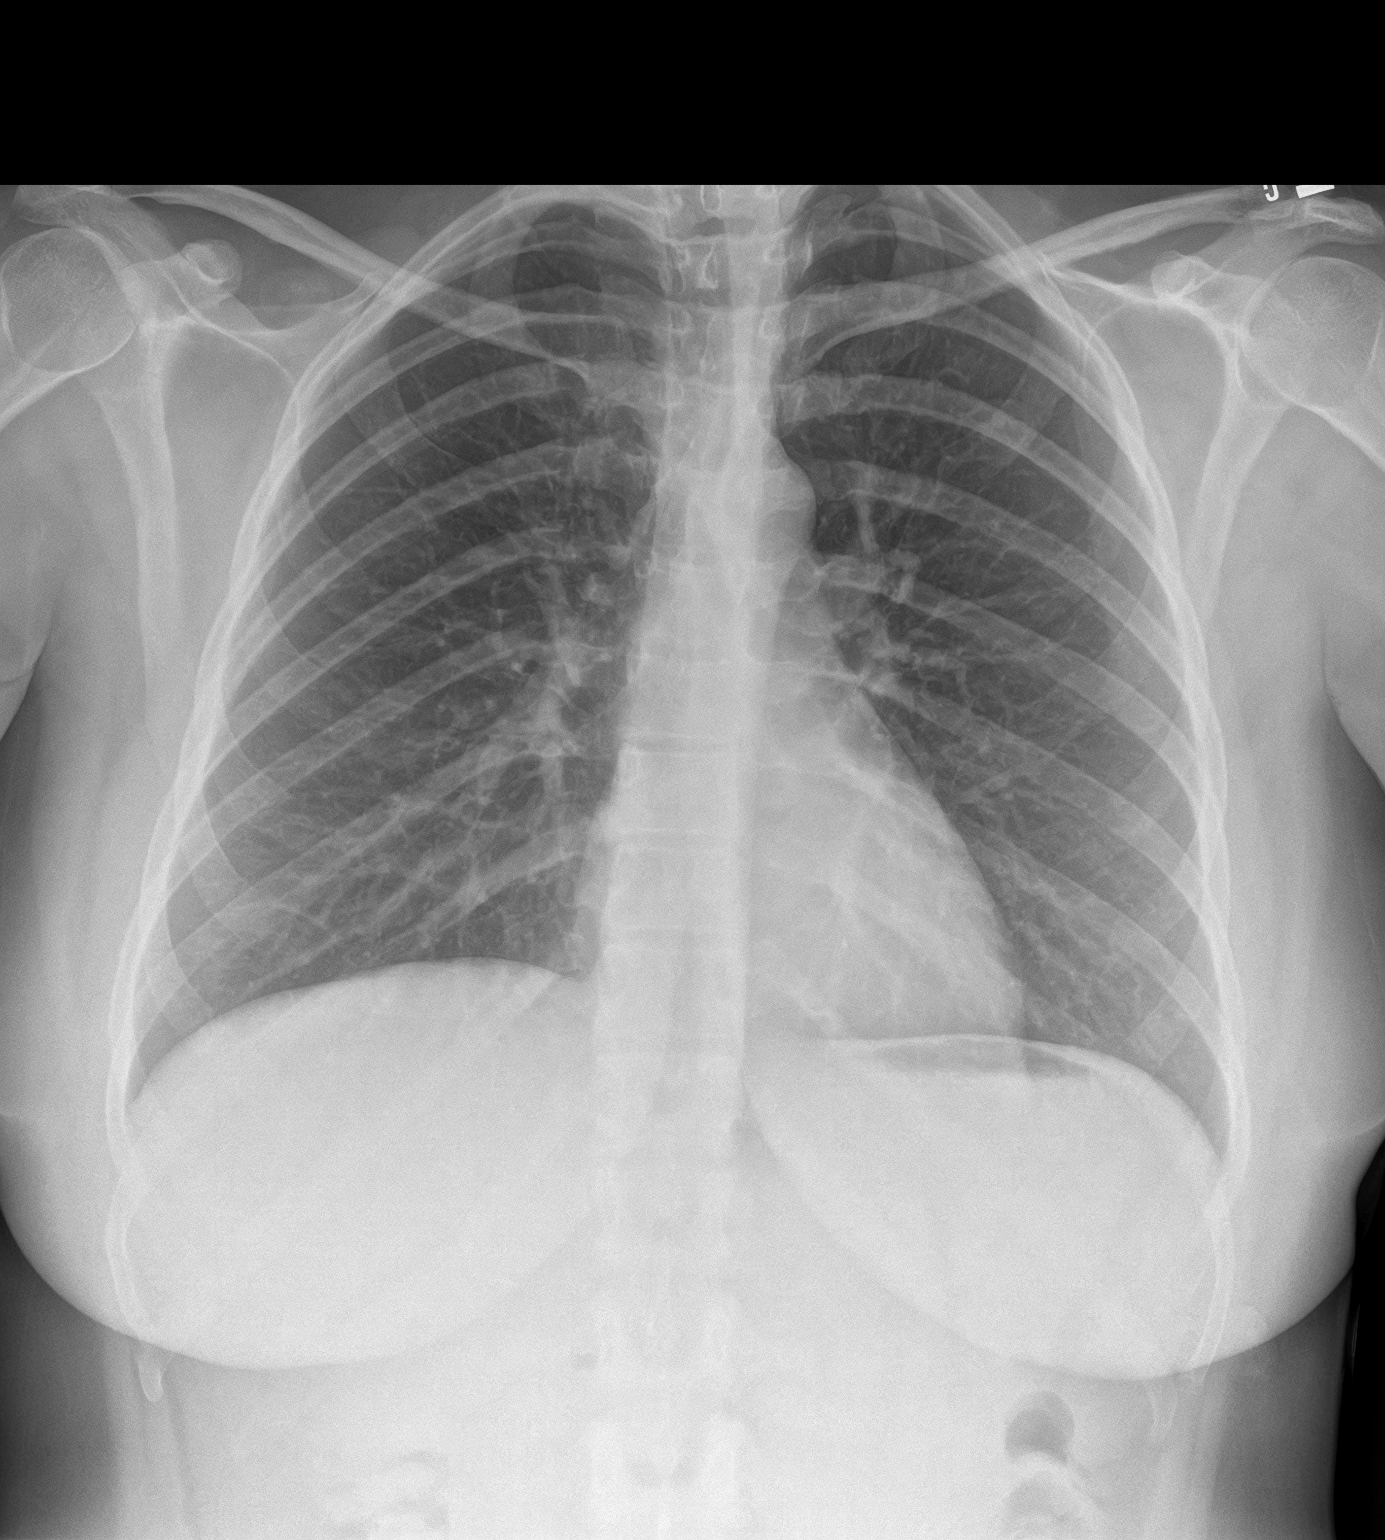
[im 2/2]
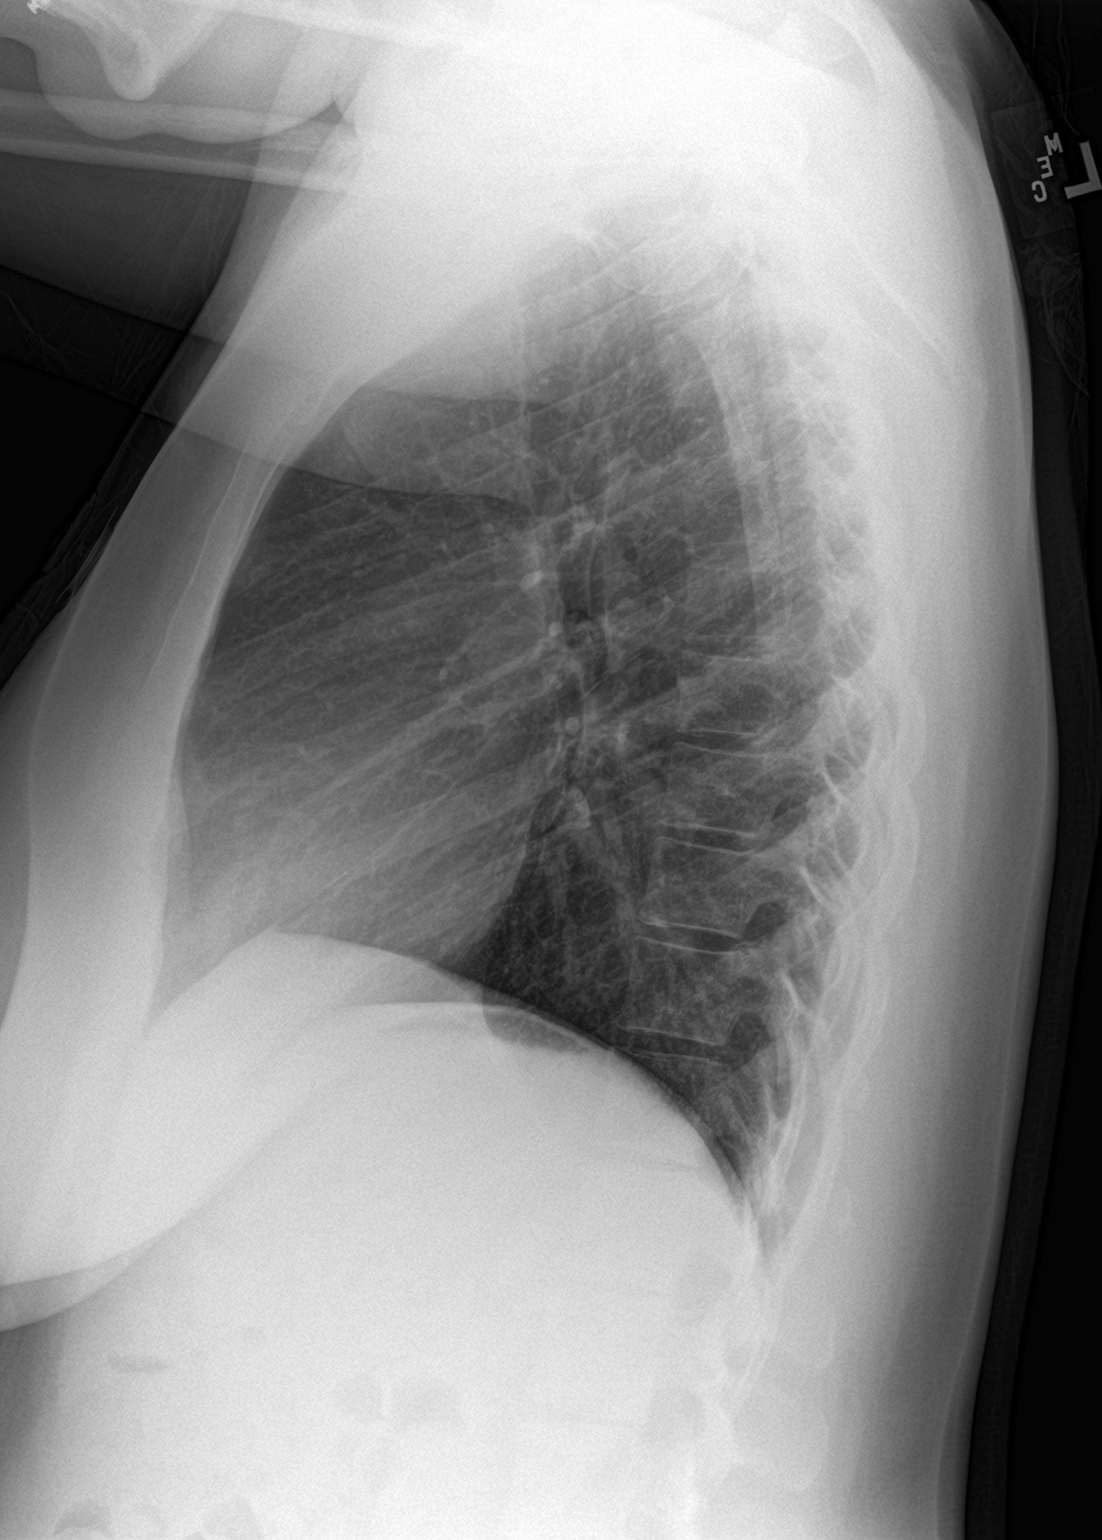

[2 of 2 positions shown; findings below may reference images not displayed]

FINDINGS: The heart size and mediastinal contours are within normal limits.
Both lungs are clear. The visualized skeletal structures are
unremarkable.
IMPRESSION: No active cardiopulmonary disease.

## 2021-09-26 NOTE — ED Notes (Signed)
Tele-neuro being done

## 2021-09-26 NOTE — ED Triage Notes (Addendum)
Pt presents to ER from home c/o right sided facial numbness that originally started on Saturday upon waking up.  Pt states she was seen at [facility] 3/13, had MRI of brain that was normal.  Pt is today c/o new right sided numbness around her mouth.  Pt was told to f/u wit neurology and have OP MRI of orbits and carotid US.Pt states today she is having some chest pain that started around 1700 and is located mostly in the center and constant in nature.  Pt also c/o associated right neck and shoulder pain.  Pt currently A&O x4 and in NAD.   ?

## 2021-09-26 NOTE — ED Provider Notes (Signed)
? ?[facility] ?Provider Note ? ? ? Event Date/Time  ? First MD Initiated Contact with Patient 09/26/21 2158   ?  (approximate) ? ?History  ? ?Chief Complaint: Chest Pain and Numbness ? ?HPI ? ?Anna Burns is a 31 y.o. female with a past medical history of anemia, migraines, presents to the emergency department for numbness to the right side of her face.  Patient is coming to the ER today for extending numbness to the right side of her face.  Patient states symptoms started initially on Saturday upon waking up she had numbness to her right scalp.  Ultimately went to [facility] on 3/13 had an MRI/MRA of the brain that was normal.  Patient was supposed to have an MRI of the orbits as well as MRA of the neck but the patient left prior to the MRI is being performed and was told she could follow-up outpatient with neurology but to return to the emergency department immediately if she had any increase in numbness or any other symptoms.  Patient states today around noon or so she developed right-sided numbness of her face.  States previously her numbness was only located in the scalp but now it is on her forehead her right eye and into her right face.  Patient denies any weakness or numbness of any arm or leg confusion or slurred speech. ? ?Physical Exam  ? ?Triage Vital Signs: ?ED Triage Vitals [09/26/21 6468]  ?Enc Vitals Group  ?   BP (!) 139/95  ?   Pulse Rate 81  ?   Resp 15  ?   Temp 98.4 ?F (36.9 ?C)  ?   Temp Source Oral  ?   SpO2 100 %  ?   Weight   ?   Height   ?   Head Circumference   ?   Peak Flow   ?   Pain Score 4  ?   Pain Loc   ?   Pain Edu?   ?   Excl. in [place]?   ? ? ?Most recent vital signs: ?Vitals:  ? 09/26/21 1753 09/26/21 2015  ?BP: (!) 139/95 135/87  ?Pulse: 81 99  ?Resp: 15 16  ?Temp: 98.4 ?F (36.9 ?C) 98.4 ?F (36.9 ?C)  ?SpO2: 100% 100%  ? ? ?General: Awake, no distress.  ?CV:  Good peripheral perfusion.  Regular rate and rhythm  ?Resp:  Normal effort.  Equal breath  sounds bilaterally.  ?Abd:  No distention.  Soft, nontender.  No rebound or guarding. ?Other:  Patient has equal grip strength.  No pronator drift.  No observable cranial nerve deficits but does state decreased sensation across her scalp forehead and maxillary area, normal/equal sensation below the lips on the face.  Motor appears intact. ? ? ?ED Results / Procedures / Treatments  ? ?EKG ? ?EKG viewed and interpreted by myself shows a normal sinus rhythm at 83 bpm with a narrow QRS, normal axis, normal intervals, no concerning ST changes.  Reassuring EKG. ? ?RADIOLOGY ? ?I have personally reviewed the chest x-ray images.  No acute abnormality seen on my evaluation. ?Chest x-ray is negative. ? ? ?MEDICATIONS ORDERED IN ED: ?Medications - No data to display ? ? ?IMPRESSION / MDM / ASSESSMENT AND PLAN / ED COURSE  ?I reviewed the triage vital signs and the nursing notes. ? ?Patient presents emergency department for worsening numbness to the right side of her face and scalp.  Given the patient's worsening numbness I have  consulted neurology via telemetry neurology for further evaluation.  I reviewed the patient's recent MRI/MRA of the brain which showed no concerning abnormality.  We will proceed with an MRA of the neck as well as MRI of the orbits which per patient were recommended by neurology.  However we will have neurology evaluate the patient first to see if any additional imaging would be warranted.  Patient does state a moderate headache, states a history of chronic migraines, but states over the last few days her headache has gone away but her numbness to the right scalp has remained.  Patient states they did talk to her about complex migraines but she does not believe this is what is causing her symptoms.  I once again spoke to her about complex migraines and treating her migraine headache to see if her symptoms resolved.  Patient states she does not want to do that she is only here to make sure she has not  had a stroke and she wants to feel her face again.  Patient also states she was having some chest pain earlier today but states it is her anxiety and she is not here for that she is here for her face. ? ?Patient's work-up thus far in the emergency department is reassuring.  CBC is normal.  BMP is normal.  Troponin is negative.  Chest x-ray is reassuring.  EKG is reassuring. ?Given the patient's ongoing and worsening symptoms I have consulted telemetry neurology currently awaiting evaluation to help with disposition and imaging.  Overall patient appears well, no significant abnormalities but does states subjective numbness on my exam to her right face.  Differential would include CVA, anxiety, complex migraine.  Patient care signed out to oncoming provider. ? ? ?FINAL CLINICAL IMPRESSION(S) / ED DIAGNOSES  ? ?Facial numbness ? ? ? ?Note:  This document was prepared using Dragon voice recognition software and may include unintentional dictation errors. ?  Harvest Dark, MD ?09/26/21 2314 ? ?

## 2021-09-26 NOTE — ED Notes (Signed)
POC urine pregnancy is negative ?

## 2021-09-26 NOTE — ED Notes (Signed)
Pt refused repeat trop. Stated "I dont want to do that. I dont want to do any of the extras I just want to be seen for my head. I cant feel my head. My chest pain is just anxiety." ?

## 2021-09-26 NOTE — ED Notes (Signed)
Paged teleneuro ?

## 2021-09-27 ENCOUNTER — Emergency Department: Payer: Medicaid Other

## 2021-09-27 IMAGING — MR MR HEAD WO/W CM
15 of 16 series · 42 of 48 positions shown · IV contrast (gadavist)
Comparison: MRI head without contrast [DATE]

CLINICAL DATA: History of migraines, presenting with right forehead
numbness since [REDACTED], now progressed to the upper lip

EXAM:
MRI HEAD WITHOUT AND WITH CONTRAST
TECHNIQUE: Multiplanar, multiecho pulse sequences of the brain and surrounding
structures were obtained without and with intravenous contrast.
CONTRAST:  7.5mL GADAVIST GADOBUTROL 1 MMOL/ML IV SOLN

[Series 5: ax dwi_tracew · axial · 3.0mm · 0.65mm/px · z∈[-58,+95]mm · 3 of 48 slices shown]
[im 1/48]
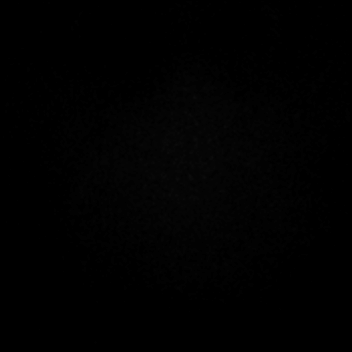
[im 24/48]
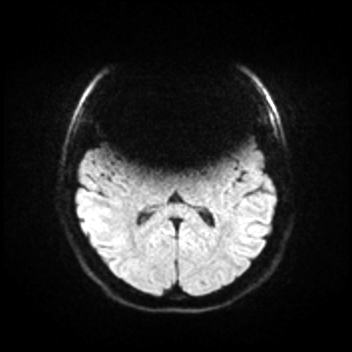
[im 48/48]
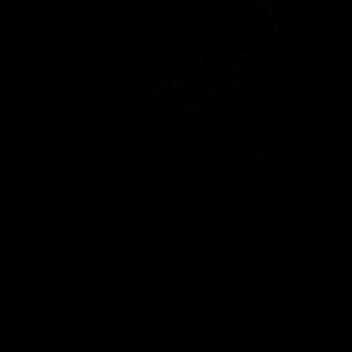

[Series 6: ax dwi_adc · axial · 3.0mm · 0.65mm/px · z∈[-48,+92]mm · 3 of 44 slices shown]
[im 1/44]
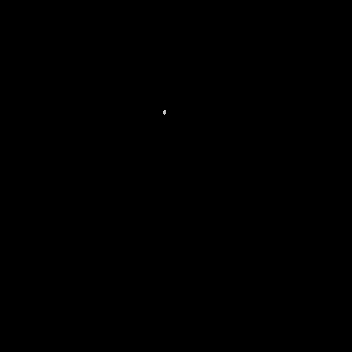
[im 22/44]
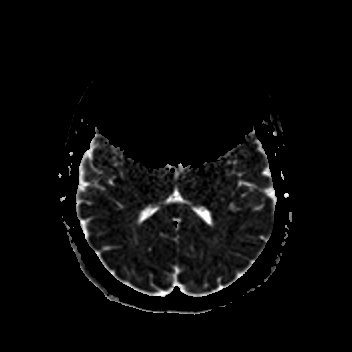
[im 44/44]
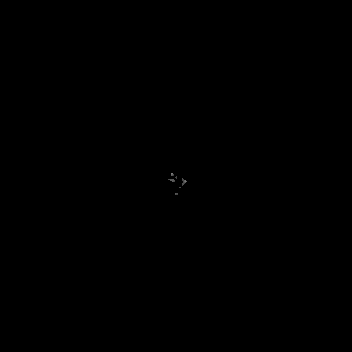

[Series 7: cor dwi_tracew · coronal · 5.0mm · 0.65mm/px · 2 of 34 slices shown]
[im 1/34]
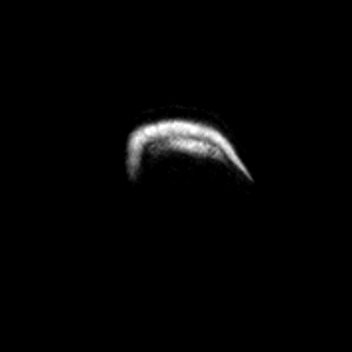
[im 34/34]
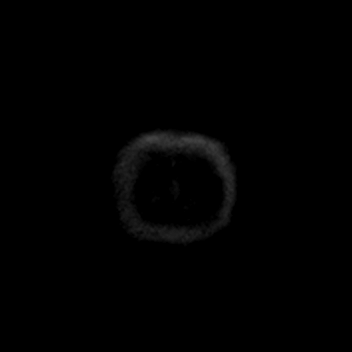

[Series 8: cor dwi_adc · coronal · 5.0mm · 0.65mm/px · 2 of 34 slices shown]
[im 1/34]
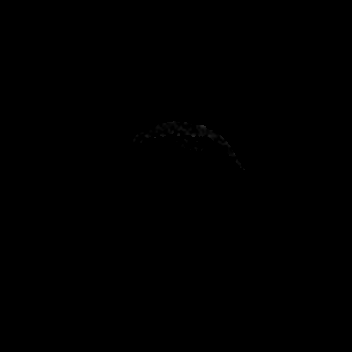
[im 34/34]
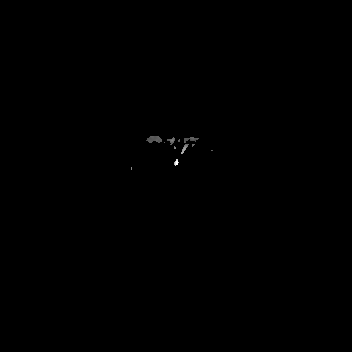

[Series 9: FLAIR · axial · 3.0mm · 0.53mm/px · z∈[-37,+116]mm · 3 of 55 slices shown]
[im 1/55]
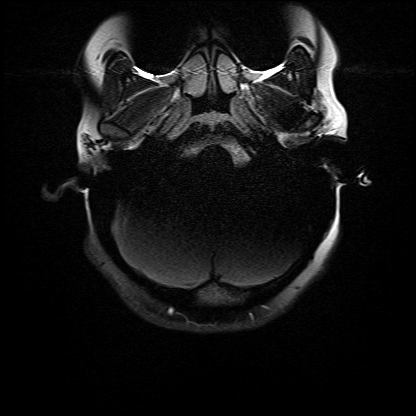
[im 28/55]
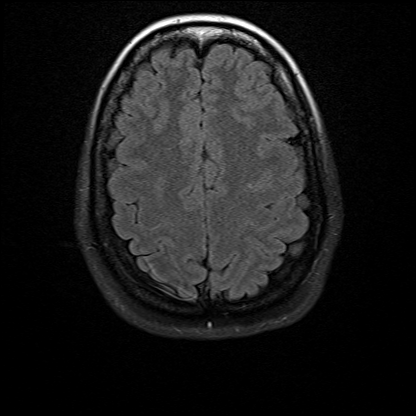
[im 55/55]
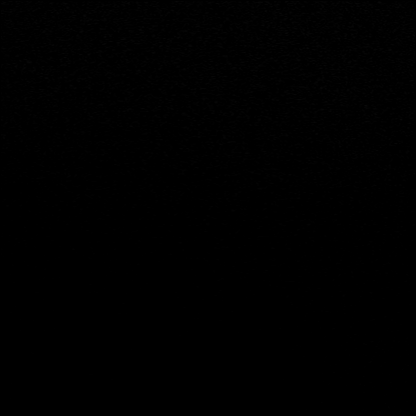

[Series 10: T1 · sagittal · 5.0mm · 0.62mm/px · 1 of 21 slices shown (1 of 4)]
[im 1/21]
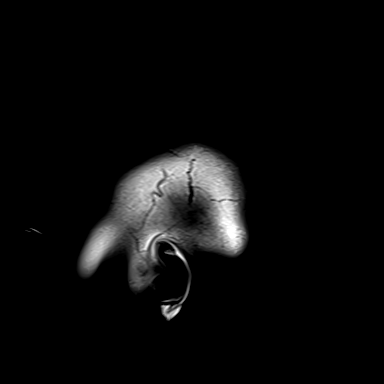

[Series 11: T2 · axial · 5.0mm · 0.53mm/px · 1 of 25 slices shown]
[im 1/25]
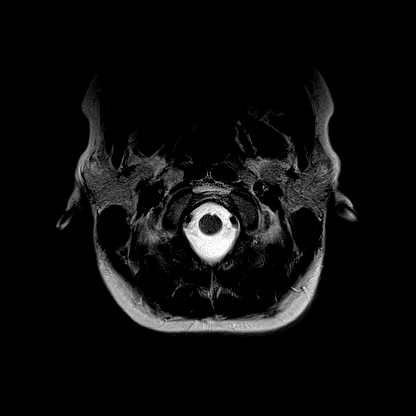

[Series 13: pha_images · axial · 3.0mm · 0.90mm/px · z∈[-69,+11]mm · 2 of 56 slices shown]
[im 1/56]
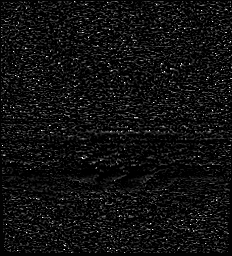
[im 28/56]
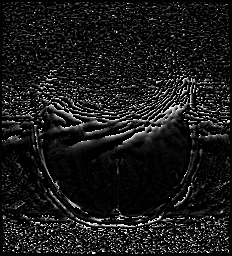

[Series 16: T1 · axial · 1.0mm · 0.98mm/px · z∈[-53,+88]mm · 8 of 144 slices shown (2 of 4)]
[im 1/144]
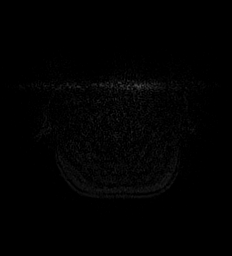
[im 18/144]
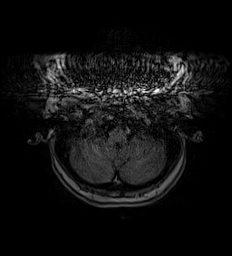
[im 36/144]
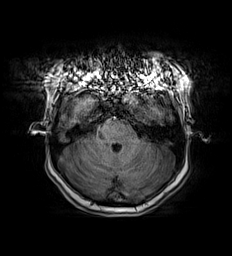
[im 54/144]
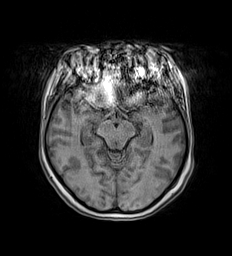
[im 90/144]
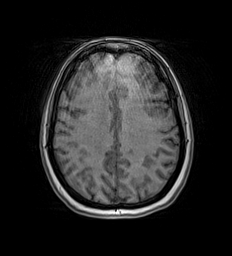
[im 108/144]
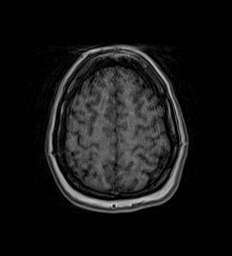
[im 126/144]
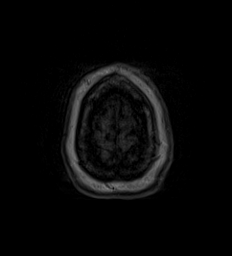
[im 144/144]
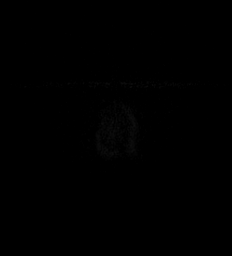

[Series 17: T2 post-contrast · coronal · 5.0mm · 0.57mm/px · 2 of 29 slices shown]
[im 1/29]
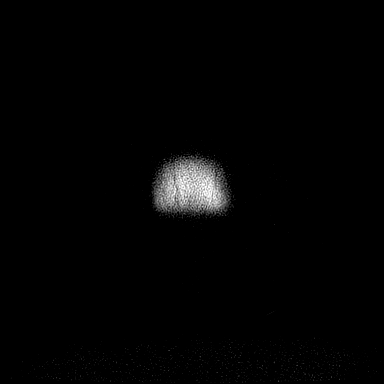
[im 29/29]
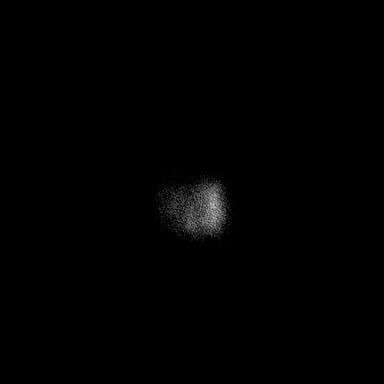

[Series 18: T1 · axial · non-contrast · 3.0mm · 0.31mm/px · 1 of 23 slices shown (3 of 4)]
[im 1/23]
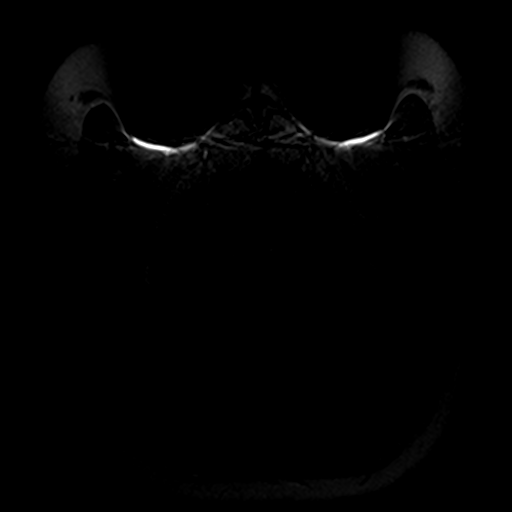

[Series 19: T1 · coronal · non-contrast · 3.0mm · 0.35mm/px · 2 of 40 slices shown (4 of 4)]
[im 1/40]
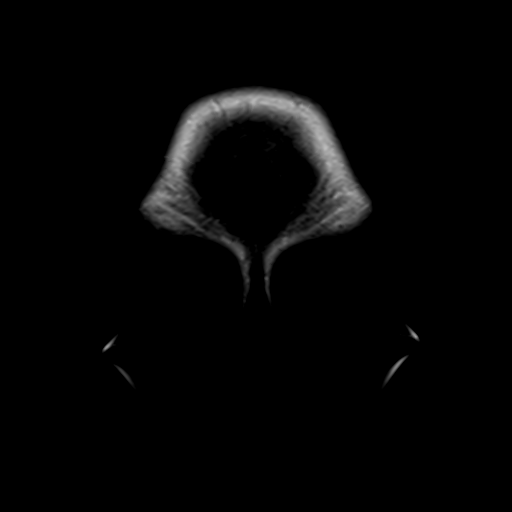
[im 40/40]
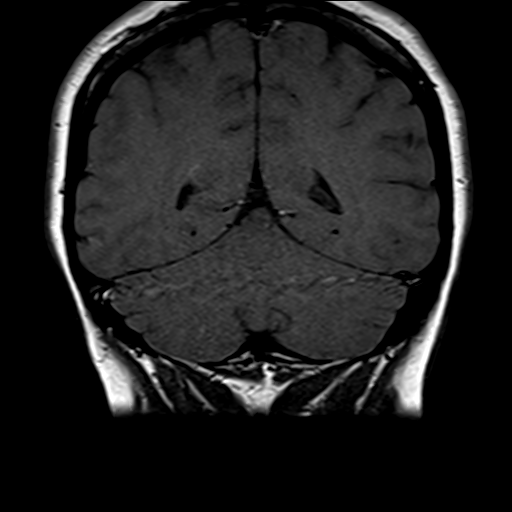

[Series 20: T1 post-contrast · coronal · 5.0mm · 0.57mm/px · 2 of 29 slices shown (1 of 3)]
[im 1/29]
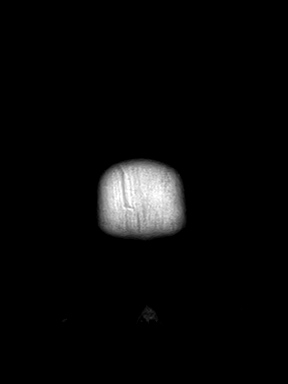
[im 29/29]
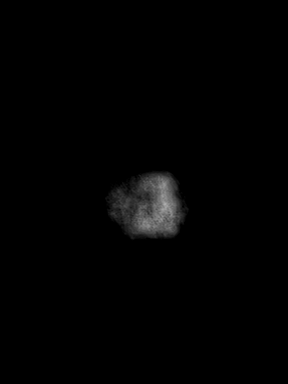

[Series 21: T1 post-contrast · sagittal · 5.0mm · 0.62mm/px · 1 of 21 slices shown (2 of 3)]
[im 1/21]
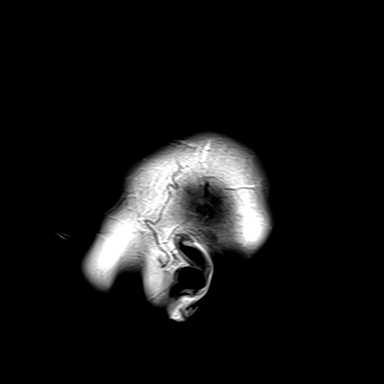

[Series 22: T1 post-contrast · axial · 1.0mm · 0.98mm/px · z∈[-53,+88]mm · 9 of 144 slices shown (3 of 3)]
[im 1/144]
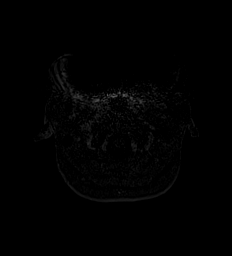
[im 18/144]
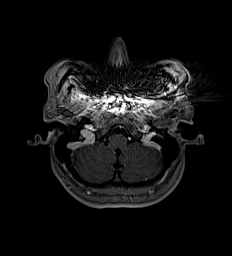
[im 36/144]
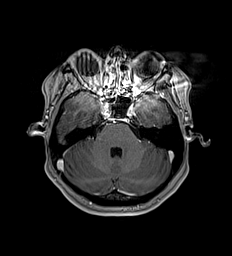
[im 54/144]
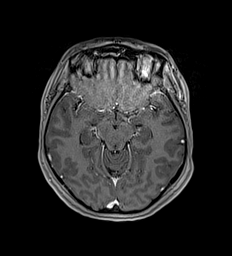
[im 72/144]
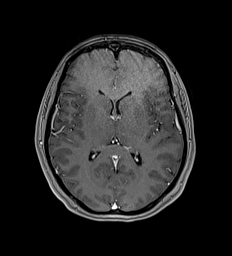
[im 90/144]
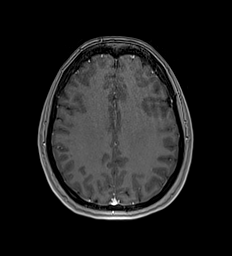
[im 108/144]
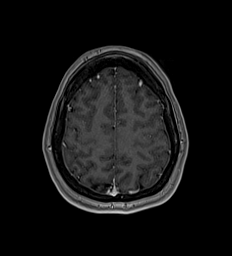
[im 126/144]
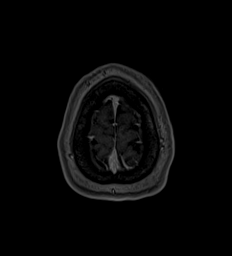
[im 144/144]
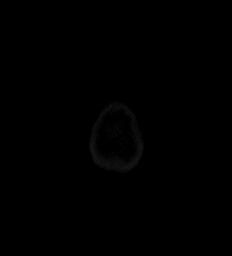

[42 of 48 positions shown; findings below may reference images not displayed]

FINDINGS: Evaluation is limited by susceptibility artifact from the patient's
dental hardware, which particularly affects susceptibility weighted
and diffusion-weighted sequences.

Brain: No restricted diffusion to suggest acute or subacute infarct.
No acute hemorrhage, mass, mass effect, or midline shift. No
hydrocephalus or extra-axial collection. No abnormal parenchymal or
meningeal enhancement.

The cisternal portions of the trigeminal nerves are unremarkable.
Any evaluation of a more distal course is limited by the
aforementioned susceptibility artifact.

Vascular: Normal flow voids.

Skull and upper cervical spine: Normal marrow signal.

Sinuses/Orbits: Evaluation is significantly limited by
susceptibility artifact from the patient's dental hardware. There is
likely mucosal thickening in the ethmoid air cells bilaterally.

Other: The mastoids are well aerated.
IMPRESSION: 1. As on the prior exam, evaluation is limited by susceptibility
artifact from the patient's dental hardware. This limits evaluation
of the trigeminal nerves, with the exception of the cisternal
portions, which appear unremarkable.
2. No acute intracranial process.  No abnormal enhancement.

## 2021-09-27 IMAGING — MR MR MRA NECK WO/W CM
1 of 2 series · 20 of 48 positions shown · IV contrast (7.5ml Gadavist)
Comparison: None.

CLINICAL DATA: Neck pain, right maxillary numbness, headache

EXAM:
MRA NECK WITHOUT AND WITH CONTRAST
TECHNIQUE: Multiplanar and multiecho pulse sequences of the neck were obtained
without and with intravenous contrast. Angiographic images of the
neck were obtained using MRA technique without and with intravenous
contrast.
CONTRAST:  7.5mL GADAVIST GADOBUTROL 1 MMOL/ML IV SOLN

[Series 17: angio_fl3d_cor_post_ttc=2.0s_moco-adv_sub · coronal · 0.9mm · 0.85mm/px · 20 of 95 slices shown]
[im 1/95]
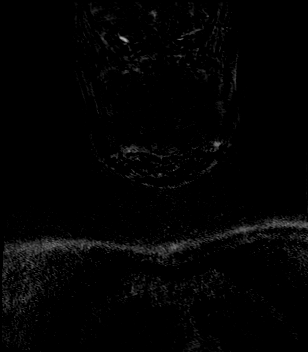
[im 5/95]
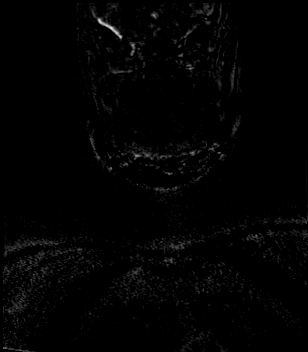
[im 10/95]
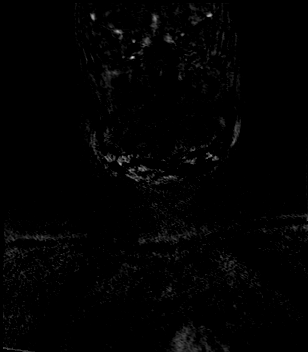
[im 15/95]
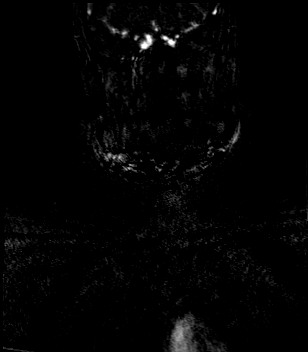
[im 20/95]
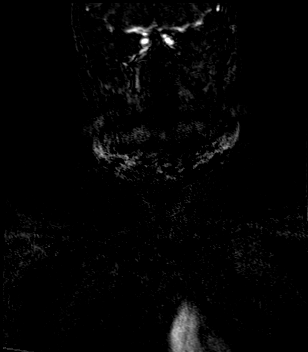
[im 25/95]
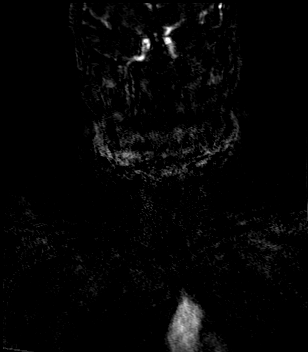
[im 30/95]
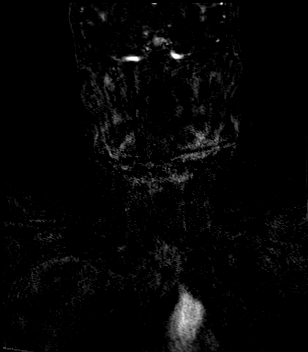
[im 35/95]
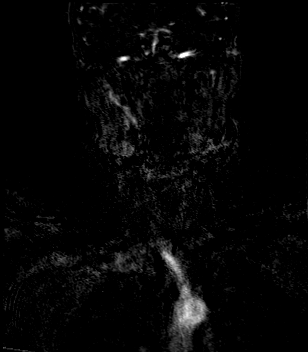
[im 40/95]
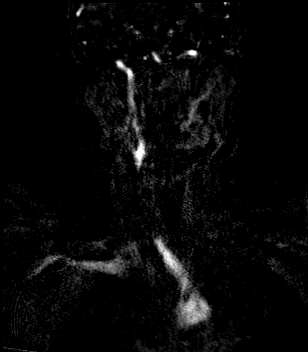
[im 45/95]
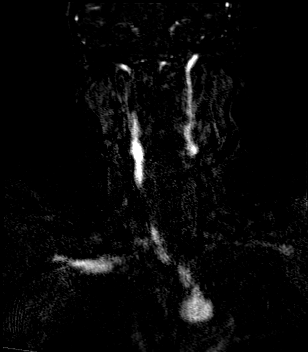
[im 50/95]
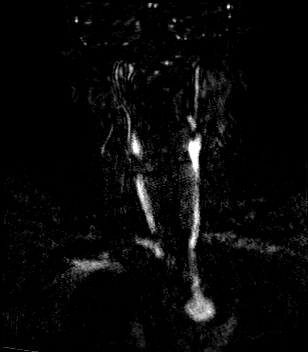
[im 55/95]
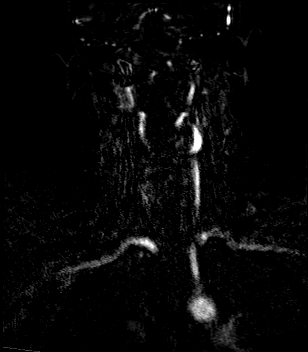
[im 60/95]
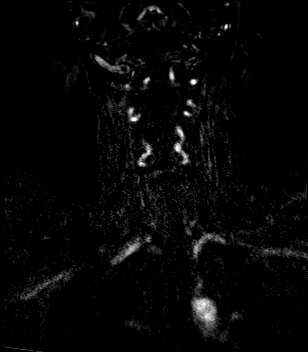
[im 65/95]
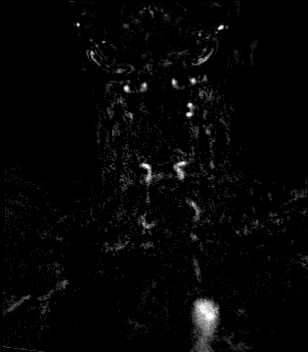
[im 70/95]
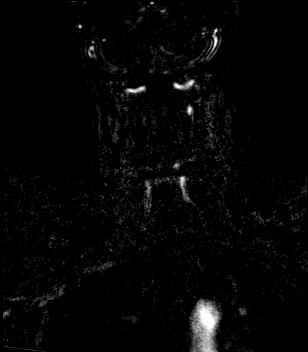
[im 75/95]
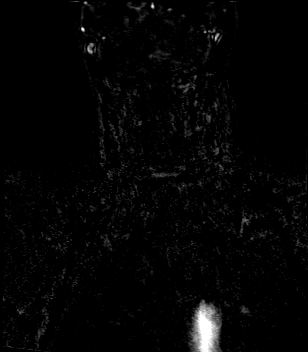
[im 80/95]
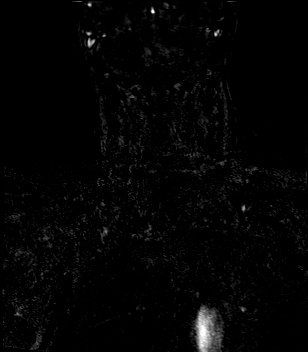
[im 85/95]
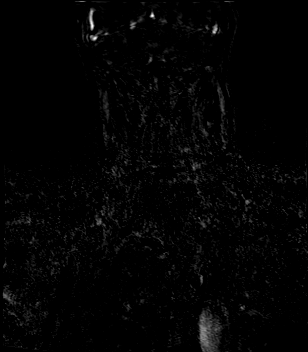
[im 90/95]
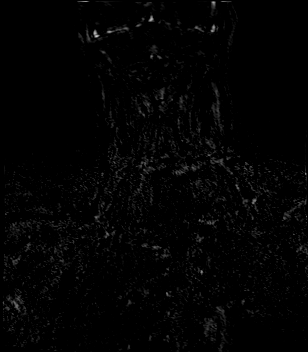
[im 95/95]
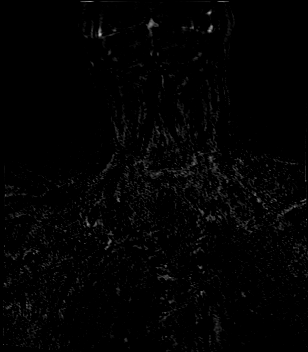

[20 of 48 positions shown; findings below may reference images not displayed]

FINDINGS: Evaluation is somewhat limited by artifact, particularly the mid
internal carotid arteries, likely related to the patient's dental
hardware.

Standard aortic branching. Common, internal, and external carotid
arteries are patent, without hemodynamically significant stenosis at
the bifurcation or in the proximal ICA. Extracranial vertebral
arteries are patent, without hemodynamically significant stenosis.
IMPRESSION: Evaluation is somewhat limited by artifact, particularly affecting
the mid internal carotid arteries. Within this limitation, no
hemodynamically significant stenosis in the neck.

## 2021-09-27 MED ORDER — DIPHENHYDRAMINE HCL 50 MG/ML IJ SOLN
50.0000 mg | Freq: Once | INTRAMUSCULAR | Status: DC
  Filled 2021-09-27: qty 1

## 2021-09-27 MED ORDER — GADOBUTROL 1 MMOL/ML IV SOLN
7.5000 mL | Freq: Once | INTRAVENOUS | Status: AC | PRN
  Administered 2021-09-27: 7.5 mL via INTRAVENOUS
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
## 2021-09-27 NOTE — Consult Note (Signed)
[facility] ? ?Stat Consult ? ?Patient Name:   Anna Burns, Anna Burns ?Date of Birth:   1991/06/05 ?Identification Number:   MRN - [id number] ?Date of Service:   09/26/2021 22:38:37 ? ?Diagnosis: ?      R20.2 - Paresthesia of skin ? ?Impression ?31 yo female with history of migraines presenting to the ED with R forehead numbness since Saturday now progressing to the upper lip. She had an MRI Brain wo and MRA head on 3/13 which was negative. Recommend MRI Brain w/wo contrast and MRA neck w/wo contrast for further evaluation. Etiology for symptoms not entirely clear, but localize to the trigeminal nerve V1 and V2 distributions. Pertinent to assess for any cavernous sinus pathology, demyelinating disease, and follow the course of the trigeminal nerve on imaging to assess for any abnormal enhancement. Complex migraine also possible, but abnormal to be so persistent in duration. Close outpatient neurology follow-up, has an appointment on Friday. ? ?Our recommendations are outlined below. ? ?Diagnostic Studies: ?MRI head with and without contrast ?Study/results pending ?MRA neck with contrast ?study/results pending ? ? ? ?---------------------------------------------------------------------------------------------------- ? ?Metrics: ?[facility] Notification Time: 09/26/2021 22:36:37 ?Stamp Time: 09/26/2021 22:38:37 ?Callback Response Time: 09/26/2021 22:38:54 ? ? ? ? ?---------------------------------------------------------------------------------------------------- ? ?Chief Complaint: ?R scalp and face numbness ? ?History of Present Illness: ?Patient is a 31 year old Female. ?Patient reports that on Saturday at 0300 she woke up from sleep with had R scalp tingling. She went back to sleep and when she was doing her hair had some Right forehead numbness and R eyebrow numbness. Woke up on Sunday with persistent symptoms. She emailed her PCP who recommended she come to the  ED. She was seen at OSH where MRI Brain wo and MRA head/neck which were all normal. Yesterday, she thought the it was getting better. Then today she had some worsening extending to the R side of face and lips. On Saturday she did have the feeling like her Right foot "wasn't there". She was taking phentermine, stopped cold [place] on Friday. She reports that she has headaches every day. She has an appointment with outpatient neurology on Friday. ?  ? ?Medications: ? ?No Anticoagulant use  ?No Antiplatelet use ?Reviewed EMR for current medications ? ?Allergies:  ?Reviewed ? ?Social History: ?Smoking: No ?Alcohol Use: No ?Drug Use: No ? ?Family History: ? ?There is no family history of premature cerebrovascular disease pertinent to this consultation ? ?ROS : ?14 Points Review of Systems was performed and was negative except mentioned in HPI. ? ?Past Surgical History: ?There Is No Surgical History Contributory To Today?s Visit ? ?  ?Examination: ?BP(135/87), Pulse(99), ?1A: Level of Consciousness - Alert; keenly responsive + 0 ?1B: Ask Month and Age - Both Questions Right + 0 ?1C: Blink Eyes & Squeeze Hands - Performs Both Tasks + 0 ?2: Test Horizontal Extraocular Movements - Normal + 0 ?3: Test Visual Fields - No Visual Loss + 0 ?4: Test Facial Palsy (Use Grimace if Obtunded) - Normal symmetry + 0 ?5A: Test Left Arm Motor Drift - No Drift for 10 Seconds + 0 ?5B: Test Right Arm Motor Drift - No Drift for 10 Seconds + 0 ?6A: Test Left Leg Motor Drift - No Drift for 5 Seconds + 0 ?6B: Test Right Leg Motor Drift - No Drift for 5 Seconds + 0 ?7: Test Limb Ataxia (FNF/Heel-Shin) - No Ataxia + 0 ?8: Test Sensation - Normal; No sensory loss + 0 ?9: Test Language/Aphasia - Normal; No aphasia +  0 ?10: Test Dysarthria - Normal + 0 ?11: Test Extinction/Inattention - No abnormality + 0 ? ?NIHSS Score: 0 ? ? ? ? ?Patient / Family was informed the Neurology Consult would occur via TeleHealth consult by way of interactive audio and  video telecommunications and consented to receiving care in this manner. ? ?Patient is being evaluated for possible acute neurologic impairment and high probability of imminent or life - threatening deterioration.I spent total of 45 minutes providing care to this patient, including time for face to face visit via telemedicine, review of medical records, imaging studies and discussion of findings with providers, the patient and / or family. ? ? ?Dr Janeece Agee ? ? ?[facility] ?[phone] ? ?Case [id number] ?

## 2021-09-28 ENCOUNTER — Ambulatory Visit: Payer: Medicaid Other | Admitting: Neurology

## 2021-09-28 ENCOUNTER — Encounter: Payer: Self-pay | Admitting: Neurology

## 2021-09-28 VITALS — BP 120/88 | HR 93 | Ht 63.5 in | Wt 169.5 lb

## 2021-09-28 DIAGNOSIS — R200 Anesthesia of skin: Secondary | ICD-10-CM

## 2021-09-28 DIAGNOSIS — G43009 Migraine without aura, not intractable, without status migrainosus: Secondary | ICD-10-CM | POA: Diagnosis not present

## 2021-09-28 MED ORDER — METHYLPREDNISOLONE 4 MG PO TBPK: ORAL_TABLET | ORAL | 0 refills | Status: AC

## 2021-09-28 MED ORDER — TOPIRAMATE 25 MG PO TABS: 25.0000 mg | ORAL_TABLET | Freq: Two times a day (BID) | ORAL | 6 refills | Status: AC

## 2021-09-28 MED ORDER — SUMATRIPTAN SUCCINATE 50 MG PO TABS: 50.0000 mg | ORAL_TABLET | ORAL | 3 refills | Status: AC | PRN

## 2021-09-28 NOTE — Patient Instructions (Addendum)
Start with Medrol dose pack  ?6 tabs day 1  ?5 tabs day 2 ?4 tabs day 3 ?3 tabs day 4 ?2 tabs day 5 ?1 tab day 6 ? ?Start with Topiramate 25 mg 1/2 tab nightly for one week then increase to full tab  ? ?Start with Sumatriptan 50 mg as needed for the headaches, do not take more than 2 tabs in a 24 hours period  ? ?Recommend starting prenatal vitamin  ?Return in 3 months  ? ? ?

## 2021-09-28 NOTE — Progress Notes (Signed)
? ?[facility] ? ?PATIENT: Anna Burns ?DOB: [birth year] ? ?REQUESTING CLINICIAN: Kommor, [place], MD ?HISTORY FROM: Patient  ?REASON FOR VISIT: Right facial numbness and headaches ? ? ?HISTORICAL ? ?CHIEF COMPLAINT:  ?Chief Complaint  ?Patient presents with  ? RM 13  ?  Here alone for evaluation. Has numbness in the right side of her face. Started on top of head and now more areas are affected. Started last Saturday. She went to the ER Monday. She took phentermine for a month straight then stopped it abruptly the Friday before the numbness started. Has been under a lot of stress recently as well.   ? ? ?HISTORY OF PRESENT ILLNESS:  ?This is a 31 year old woman past medical history migraine headaches, anxiety who is presenting with complaint of numbness starting up on top of right side of her head, now involving the right side of her nose and top of the lip, on the right side.  Patient reports the symptoms started last Saturday, she went to the ED had brain MRI which was negative for any acute intracranial abnormality but could not tolerate the MRA of the head and neck.  At that time she was also complaining of headaches, they told her that this might be related to her migraine.  She presented 2 days later because of worsening of the numbness.  Initially patient stated the numbness was only involving the right forehead but 2 days later it involves now the bridge of her nose on the right-hand side at the top of the lip on the right.  ?She presented to the ED again, at that time had a full work-up MRI of the head again and MRA of the head and neck.  She does have braces which obscure the images but no acute abnormalities were seen.  Again this was attributed to possibly her migraine or due to stress.   ?She does report being under a lot of stress, family stress, she is married with 4 children, the last one being under 1 and she did report that on her cousin was recently murdered in [place] and  that caused the increased anxiety.   ?For her headache she does have a history of migraine, she was at some point put on topiramate but she never tried it for prevention of her migraines and she did try sumatriptan at one point with some relief.  Currently she is not on any abortive or preventive medication.  She reports daily headache, usually start in the afternoon and last the whole evening until she goes to sleep.  They are associated nausea, photophobia and phonophobia.   ? ? ? ?OTHER MEDICAL CONDITIONS: Anxiety, Migraines headaches. ? ? ?REVIEW OF SYSTEMS: Full 14 system review of systems performed and negative with exception of: as noted in the HPI ? ?ALLERGIES: ?Allergies  ?Allergen Reactions  ? Contrast Media [Iodinated Contrast Media] Itching and Other (See Comments)  ?  Itching on tongue  ? ? ?HOME MEDICATIONS: ?Outpatient Medications Prior to Visit  ?Medication Sig Dispense Refill  ? acetaminophen (TYLENOL) 500 MG tablet Take 1,000 mg by mouth every 6 (six) hours as needed.    ? levonorgestrel (MIRENA) 20 MCG/DAY IUD 1 each by Intrauterine route once.    ? albuterol (VENTOLIN HFA) 108 (90 Base) MCG/ACT inhaler Inhale into the lungs every 4 (four) hours as needed for wheezing or shortness of breath. (Patient not taking: Reported on 09/26/2021)    ? busPIRone (BUSPAR) 5 MG tablet Take 5 mg by mouth 3 (  three) times daily as needed. (Patient not taking: Reported on 09/28/2021)    ? FLUoxetine (PROZAC) 20 MG capsule Take 20 mg by mouth daily. (Patient not taking: Reported on 09/26/2021)    ? metroNIDAZOLE (FLAGYL) 500 MG tablet Take 500 mg by mouth 2 (two) times daily. (Patient not taking: Reported on 09/28/2021)    ? norethindrone-ethinyl estradiol-FE (LOESTRIN FE) 1-20 MG-MCG tablet Junel FE 1/20 (28) 1 mg-20 mcg (21)/75 mg (7) tablet (Patient not taking: Reported on 09/26/2021)    ? phentermine (ADIPEX-P) 37.5 MG tablet Take 37.5 mg by mouth daily. (Patient not taking: Reported on 09/28/2021)    ? rosuvastatin  (CRESTOR) 10 MG tablet Take 10 mg by mouth at bedtime. (Patient not taking: Reported on 09/28/2021)    ? sertraline (ZOLOFT) 100 MG tablet sertraline 100 mg tablet (Patient not taking: Reported on 09/26/2021)    ? acetaminophen (TYLENOL) 325 MG tablet Take 2 tablets (650 mg total) by mouth every 4 (four) hours as needed (for pain scale < 4). (Patient not taking: Reported on 09/26/2021)    ? calcium carbonate (TUMS - DOSED IN MG ELEMENTAL CALCIUM) 500 MG chewable tablet Chew 1 tablet by mouth 5 (five) times daily as needed for indigestion or heartburn. (Patient not taking: Reported on 09/26/2021)    ? ?No facility-administered medications prior to visit.  ? ? ?PAST MEDICAL HISTORY: ?Past Medical History:  ?Diagnosis Date  ? Anemia   ? Asthma   ? Fibroid   ? Genital herpes simplex 02/27/2018  ? Gestational HTN 04/22/2018  ? Gestational hypertension 04/22/2018  ? Ovarian tumor (benign), left   ? Pregnancy induced hypertension   ? Pregnant 03/27/2017  ? SAB (spontaneous abortion) 04/04/2017  ? ? ?PAST SURGICAL HISTORY: ?Past Surgical History:  ?Procedure Laterality Date  ? ELBOW SURGERY    ? LEFT OOPHORECTOMY  2013  ? ? ?FAMILY HISTORY: ?Family History  ?Problem Relation Age of Onset  ? Migraines Mother   ? Aneurysm Mother   ? Diabetes Mother   ? Stroke Mother   ? Diabetes Father   ? Diabetes Paternal Grandmother   ? Tourette syndrome Daughter   ? ? ?SOCIAL HISTORY: ?Social History  ? ?Socioeconomic History  ? Marital status: Married  ?  Spouse name: Not on file  ? Number of children: 4  ? Years of education: Not on file  ? Highest education level: Not on file  ?Occupational History  ? Occupation: Camera operator   ?Tobacco Use  ? Smoking status: Never  ? Smokeless tobacco: Never  ?Vaping Use  ? Vaping Use: Never used  ?Substance and Sexual Activity  ? Alcohol use: No  ? Drug use: No  ? Sexual activity: Yes  ?  Birth control/protection: None  ?Other Topics Concern  ? Not on file  ?Social History Narrative  ? Right  handed  ? Caffeine: 1 cup/day coffee  ? Lives at home with husband and 4 children  ? ?Social Determinants of Health  ? ?Financial Resource Strain: Not on file  ?Food Insecurity: Not on file  ?Transportation Needs: Not on file  ?Physical Activity: Not on file  ?Stress: Not on file  ?Social Connections: Not on file  ?Intimate Partner Violence: Not on file  ? ? ? ?PHYSICAL EXAM ? ? ?GENERAL EXAM/CONSTITUTIONAL: ?Vitals:  ?Vitals:  ? 09/28/21 0928  ?BP: 120/88  ?Pulse: 93  ?Weight: 169 lb 8 oz (76.9 kg)  ?Height: 5' 3.5" (1.613 m)  ? ?Body mass index is 29.55 kg/m?. ?Wt  Readings from Last 3 Encounters:  ?09/28/21 169 lb 8 oz (76.9 kg)  ?09/24/21 165 lb (74.8 kg)  ?11/07/20 225 lb 6.4 oz (102.2 kg)  ? ?Patient is in no distress; well developed, nourished and groomed; neck is supple ? ?CARDIOVASCULAR: ?Examination of carotid arteries is normal; no carotid bruits ?Regular rate and rhythm, no murmurs ?Examination of peripheral vascular system by observation and palpation is normal ? ?EYES: ?Pupils round and reactive to light, Visual fields full to confrontation, Extraocular movements intacts,  ? ?MUSCULOSKELETAL: ?Gait, strength, tone, movements noted in Neurologic exam below ? ?NEUROLOGIC: ?MENTAL STATUS:  ?No flowsheet data found. ?awake, alert, oriented to person, place and time ?recent and remote memory intact ?normal attention and concentration ?language fluent, comprehension intact, naming intact ?fund of knowledge appropriate ? ?CRANIAL NERVE:  ?2nd, 3rd, 4th, 6th - pupils equal and reactive to light, visual fields full to confrontation, extraocular muscles intact, no nystagmus ?5th - facial sensation symmetric ?7th - facial strength symmetric ?8th - hearing intact ?9th - palate elevates symmetrically, uvula midline ?11th - shoulder shrug symmetric ?12th - tongue protrusion midline ? ?MOTOR:  ?normal bulk and tone, full strength in the BUE, BLE ? ?SENSORY:  ?Decrease sensation to light touch to V1, V2 distribution on  the right otherwise normal sensation.  ? ?COORDINATION:  ?finger-nose-finger, fine finger movements normal ? ?REFLEXES:  ?deep tendon reflexes present and symmetric ? ?GAIT/STATION:  ?normal ? ? ?DIAGNOSTIC

## 2021-10-23 ENCOUNTER — Ambulatory Visit: Payer: Medicaid Other | Admitting: Neurology

## 2021-12-31 ENCOUNTER — Ambulatory Visit: Payer: Medicaid Other | Admitting: Neurology

## 2022-01-06 ENCOUNTER — Encounter (HOSPITAL_BASED_OUTPATIENT_CLINIC_OR_DEPARTMENT_OTHER): Payer: Self-pay

## 2022-01-06 ENCOUNTER — Other Ambulatory Visit: Payer: Self-pay

## 2022-01-06 ENCOUNTER — Emergency Department (HOSPITAL_BASED_OUTPATIENT_CLINIC_OR_DEPARTMENT_OTHER)
Admission: EM | Admit: 2022-01-06 | Discharge: 2022-01-06 | Disposition: A | Discharge status: Home / Self Care | Payer: Medicaid Other | Attending: Emergency Medicine | Admitting: Emergency Medicine

## 2022-01-06 DIAGNOSIS — R109 Unspecified abdominal pain: Secondary | ICD-10-CM | POA: Insufficient documentation

## 2022-01-06 DIAGNOSIS — R509 Fever, unspecified: Secondary | ICD-10-CM | POA: Insufficient documentation

## 2022-01-06 DIAGNOSIS — R197 Diarrhea, unspecified: Secondary | ICD-10-CM | POA: Insufficient documentation

## 2022-01-06 LAB — CBC WITH DIFFERENTIAL/PLATELET
Abs Immature Granulocytes: 0 10*3/uL (ref 0.00–0.07)
Basophils Absolute: 0 10*3/uL (ref 0.0–0.1)
Basophils Relative: 1 %
Eosinophils Absolute: 0.1 10*3/uL (ref 0.0–0.5)
Eosinophils Relative: 2 %
HCT: 40.6 % (ref 36.0–46.0)
Hemoglobin: 12.7 g/dL (ref 12.0–15.0)
Immature Granulocytes: 0 %
Lymphocytes Relative: 24 %
Lymphs Abs: 1.8 10*3/uL (ref 0.7–4.0)
MCH: 24.6 pg — ABNORMAL LOW (ref 26.0–34.0)
MCHC: 31.3 g/dL (ref 30.0–36.0)
MCV: 78.7 fL — ABNORMAL LOW (ref 80.0–100.0)
Monocytes Absolute: 0.7 10*3/uL (ref 0.1–1.0)
Monocytes Relative: 9 %
Neutro Abs: 4.9 10*3/uL (ref 1.7–7.7)
Neutrophils Relative %: 64 %
Platelets: 295 10*3/uL (ref 150–400)
RBC: 5.16 MIL/uL — ABNORMAL HIGH (ref 3.87–5.11)
RDW: 13.2 % (ref 11.5–15.5)
WBC: 7.5 10*3/uL (ref 4.0–10.5)
nRBC: 0 % (ref 0.0–0.2)

## 2022-01-06 LAB — PREGNANCY, URINE: Preg Test, Ur: NEGATIVE

## 2022-01-06 LAB — URINALYSIS, ROUTINE W REFLEX MICROSCOPIC
Bilirubin Urine: NEGATIVE
Glucose, UA: NEGATIVE mg/dL
Ketones, ur: NEGATIVE mg/dL
Leukocytes,Ua: NEGATIVE
Nitrite: NEGATIVE
Specific Gravity, Urine: 1.033 — ABNORMAL HIGH (ref 1.005–1.030)
pH: 6 (ref 5.0–8.0)

## 2022-01-06 LAB — COMPREHENSIVE METABOLIC PANEL
ALT: 21 U/L (ref 0–44)
AST: 21 U/L (ref 15–41)
Albumin: 4.6 g/dL (ref 3.5–5.0)
Alkaline Phosphatase: 55 U/L (ref 38–126)
Anion gap: 9 (ref 5–15)
BUN: 10 mg/dL (ref 6–20)
CO2: 21 mmol/L — ABNORMAL LOW (ref 22–32)
Calcium: 9.4 mg/dL (ref 8.9–10.3)
Chloride: 105 mmol/L (ref 98–111)
Creatinine, Ser: 0.56 mg/dL (ref 0.44–1.00)
GFR, Estimated: >60 mL/min (ref >60)
Glucose, Bld: 95 mg/dL (ref 70–99)
Potassium: 3.4 mmol/L — ABNORMAL LOW (ref 3.5–5.1)
Sodium: 135 mmol/L (ref 135–145)
Total Bilirubin: 0.7 mg/dL (ref 0.3–1.2)
Total Protein: 8.2 g/dL — ABNORMAL HIGH (ref 6.5–8.1)

## 2022-01-06 MED ORDER — LACTATED RINGERS IV BOLUS
1000.0000 mL | Freq: Once | INTRAVENOUS | Status: AC
Start: 2022-01-06 — End: 2022-01-06
  Administered 2022-01-06: 1000 mL via INTRAVENOUS

## 2022-01-06 MED ORDER — LOPERAMIDE HCL 2 MG PO CAPS
4.0000 mg | ORAL_CAPSULE | Freq: Once | ORAL | Status: AC
  Administered 2022-01-06: 4 mg via ORAL
  Filled 2022-01-06: qty 2

## 2022-01-23 ENCOUNTER — Encounter (HOSPITAL_COMMUNITY): Payer: Self-pay

## 2022-01-23 ENCOUNTER — Other Ambulatory Visit: Payer: Self-pay

## 2022-01-23 ENCOUNTER — Emergency Department (HOSPITAL_COMMUNITY): Payer: Medicaid Other

## 2022-01-23 ENCOUNTER — Emergency Department (HOSPITAL_COMMUNITY)
Admission: EM | Admit: 2022-01-23 | Discharge: 2022-01-23 | Disposition: A | Discharge status: Home / Self Care | Payer: Medicaid Other | Attending: Emergency Medicine | Admitting: Emergency Medicine

## 2022-01-23 DIAGNOSIS — Z5321 Procedure and treatment not carried out due to patient leaving prior to being seen by health care provider: Secondary | ICD-10-CM | POA: Insufficient documentation

## 2022-01-23 DIAGNOSIS — R109 Unspecified abdominal pain: Secondary | ICD-10-CM | POA: Insufficient documentation

## 2022-01-23 LAB — COMPREHENSIVE METABOLIC PANEL
ALT: 16 U/L (ref 0–44)
AST: 18 U/L (ref 15–41)
Albumin: 3.8 g/dL (ref 3.5–5.0)
Alkaline Phosphatase: 52 U/L (ref 38–126)
Anion gap: 10 (ref 5–15)
BUN: 9 mg/dL (ref 6–20)
CO2: 22 mmol/L (ref 22–32)
Calcium: 9.1 mg/dL (ref 8.9–10.3)
Chloride: 104 mmol/L (ref 98–111)
Creatinine, Ser: 0.49 mg/dL (ref 0.44–1.00)
GFR, Estimated: >60 mL/min (ref >60)
Glucose, Bld: 108 mg/dL — ABNORMAL HIGH (ref 70–99)
Potassium: 3.6 mmol/L (ref 3.5–5.1)
Sodium: 136 mmol/L (ref 135–145)
Total Bilirubin: 0.3 mg/dL (ref 0.3–1.2)
Total Protein: 7.5 g/dL (ref 6.5–8.1)

## 2022-01-23 LAB — CBC WITH DIFFERENTIAL/PLATELET
Abs Immature Granulocytes: 0.04 10*3/uL (ref 0.00–0.07)
Basophils Absolute: 0 10*3/uL (ref 0.0–0.1)
Basophils Relative: 0 %
Eosinophils Absolute: 0.1 10*3/uL (ref 0.0–0.5)
Eosinophils Relative: 1 %
HCT: 37.2 % (ref 36.0–46.0)
Hemoglobin: 11.6 g/dL — ABNORMAL LOW (ref 12.0–15.0)
Immature Granulocytes: 0 %
Lymphocytes Relative: 26 %
Lymphs Abs: 2.5 10*3/uL (ref 0.7–4.0)
MCH: 25.1 pg — ABNORMAL LOW (ref 26.0–34.0)
MCHC: 31.2 g/dL (ref 30.0–36.0)
MCV: 80.5 fL (ref 80.0–100.0)
Monocytes Absolute: 0.4 10*3/uL (ref 0.1–1.0)
Monocytes Relative: 4 %
Neutro Abs: 6.6 10*3/uL (ref 1.7–7.7)
Neutrophils Relative %: 69 %
Platelets: 296 10*3/uL (ref 150–400)
RBC: 4.62 MIL/uL (ref 3.87–5.11)
RDW: 13.3 % (ref 11.5–15.5)
WBC: 9.7 10*3/uL (ref 4.0–10.5)
nRBC: 0 % (ref 0.0–0.2)

## 2022-01-23 LAB — LIPASE, BLOOD: Lipase: 36 U/L (ref 11–51)

## 2022-01-23 NOTE — ED Triage Notes (Addendum)
PER EMS: Pt reports umbilical abd pain that radiates down to her vagina, pain started tonight prior to sexual intercourse associated with nausea but the nausea has subsided. She has an IUD, recently diagnosed with BV. LMP unknown due to IUD. She also reports she had a miscarriage last month as well with twins.   BP- 120/88, HR-84, 98% RA 18g LAC

## 2022-01-23 NOTE — ED Provider Triage Note (Signed)
Emergency Medicine Provider Triage Evaluation Note  Anna Burns , a 31 y.o. female  was evaluated in triage.  Pt complains of pain around navel radiating to uterus.  Pain worse tonight after getting aroused and attempting sexual intercourse.  Does have IUD in place x6 months.  Recently had miscarriage last months, set of twins.  States OB checked IUD afterwards and told her it was in place so they left it in.  She is concerned IUD may have moved vs retained products vs new pregnancy.  Review of Systems  Positive: Abdominal pain Negative: fever  Physical Exam  BP (!) 117/93 (BP Location: Right Arm)   Pulse 89   Temp 98.1 F (36.7 C) (Oral)   Resp 20   Ht '5\' 3"'$  (1.6 m)   Wt 79.4 kg   SpO2 99%   BMI 31.00 kg/m   Gen:   Awake, no distress   Resp:  Normal effort  MSK:   Moves extremities without difficulty  Other:    Medical Decision Making  Medically screening exam initiated at 12:49 AM.  Appropriate orders placed.  Anna Burns was informed that the remainder of the evaluation will be completed by another provider, this initial triage assessment does not replace that evaluation, and the importance of remaining in the ED until their evaluation is complete.  Will check labs, pelvic US.     Anna Pickett, PA-C 01/23/22 [phone]

## 2022-05-15 ENCOUNTER — Encounter (INDEPENDENT_AMBULATORY_CARE_PROVIDER_SITE_OTHER): Payer: Medicaid Other | Admitting: Family Medicine

## 2022-08-26 ENCOUNTER — Other Ambulatory Visit: Payer: Self-pay | Admitting: Obstetrics & Gynecology

## 2022-08-26 DIAGNOSIS — N921 Excessive and frequent menstruation with irregular cycle: Secondary | ICD-10-CM

## 2022-09-03 ENCOUNTER — Encounter (HOSPITAL_COMMUNITY): Payer: Self-pay

## 2022-09-03 ENCOUNTER — Emergency Department (HOSPITAL_COMMUNITY)
Admission: EM | Admit: 2022-09-03 | Discharge: 2022-09-03 | Discharge status: Against Medical Advice | Payer: Medicaid Other | Attending: Physician Assistant | Admitting: Physician Assistant

## 2022-09-03 ENCOUNTER — Other Ambulatory Visit: Payer: Self-pay

## 2022-09-03 DIAGNOSIS — R102 Pelvic and perineal pain: Secondary | ICD-10-CM | POA: Insufficient documentation

## 2022-09-03 DIAGNOSIS — Z5321 Procedure and treatment not carried out due to patient leaving prior to being seen by health care provider: Secondary | ICD-10-CM | POA: Insufficient documentation

## 2022-09-03 DIAGNOSIS — K6289 Other specified diseases of anus and rectum: Secondary | ICD-10-CM | POA: Diagnosis not present

## 2022-09-03 LAB — CBC WITH DIFFERENTIAL/PLATELET
Abs Immature Granulocytes: 0.03 10*3/uL (ref 0.00–0.07)
Basophils Absolute: 0.1 10*3/uL (ref 0.0–0.1)
Basophils Relative: 1 %
Eosinophils Absolute: 0.1 10*3/uL (ref 0.0–0.5)
Eosinophils Relative: 1 %
HCT: 39.2 % (ref 36.0–46.0)
Hemoglobin: 12.2 g/dL (ref 12.0–15.0)
Immature Granulocytes: 0 %
Lymphocytes Relative: 22 %
Lymphs Abs: 2.4 10*3/uL (ref 0.7–4.0)
MCH: 25.2 pg — ABNORMAL LOW (ref 26.0–34.0)
MCHC: 31.1 g/dL (ref 30.0–36.0)
MCV: 81 fL (ref 80.0–100.0)
Monocytes Absolute: 0.5 10*3/uL (ref 0.1–1.0)
Monocytes Relative: 4 %
Neutro Abs: 7.6 10*3/uL (ref 1.7–7.7)
Neutrophils Relative %: 72 %
Platelets: 360 10*3/uL (ref 150–400)
RBC: 4.84 MIL/uL (ref 3.87–5.11)
RDW: 12.9 % (ref 11.5–15.5)
WBC: 10.6 10*3/uL — ABNORMAL HIGH (ref 4.0–10.5)
nRBC: 0 % (ref 0.0–0.2)

## 2022-09-03 LAB — COMPREHENSIVE METABOLIC PANEL
ALT: 18 U/L (ref 0–44)
AST: 20 U/L (ref 15–41)
Albumin: 3.9 g/dL (ref 3.5–5.0)
Alkaline Phosphatase: 58 U/L (ref 38–126)
Anion gap: 8 (ref 5–15)
BUN: 6 mg/dL (ref 6–20)
CO2: 25 mmol/L (ref 22–32)
Calcium: 9.1 mg/dL (ref 8.9–10.3)
Chloride: 104 mmol/L (ref 98–111)
Creatinine, Ser: 0.56 mg/dL (ref 0.44–1.00)
GFR, Estimated: >60 mL/min (ref >60)
Glucose, Bld: 92 mg/dL (ref 70–99)
Potassium: 4.1 mmol/L (ref 3.5–5.1)
Sodium: 137 mmol/L (ref 135–145)
Total Bilirubin: 0.3 mg/dL (ref 0.3–1.2)
Total Protein: 7.8 g/dL (ref 6.5–8.1)

## 2022-09-03 LAB — I-STAT BETA HCG BLOOD, ED (MC, WL, AP ONLY): I-stat hCG, quantitative: <5 m[IU]/mL (ref <5)

## 2022-09-03 MED ORDER — ACETAMINOPHEN 500 MG PO TABS
1000.0000 mg | ORAL_TABLET | Freq: Once | ORAL | Status: AC
Start: 2022-09-03 — End: 2022-09-03
  Administered 2022-09-03: 1000 mg via ORAL
  Filled 2022-09-03: qty 2

## 2022-09-03 MED ORDER — HYDROMORPHONE HCL 1 MG/ML IJ SOLN: 1.0000 mg | Freq: Once | INTRAMUSCULAR | Status: DC

## 2022-09-03 NOTE — ED Notes (Signed)
Pt informed this RN that she spoke to her obgyn and states that she is not wanting to stay here to be seen and that she is going to go she her ob gyn. Santiago Glad, [place] informed

## 2022-09-03 NOTE — ED Provider Triage Note (Addendum)
Emergency Medicine Provider Triage Evaluation Note  Anna Burns , a 32 y.o. female  was evaluated in triage.  Pt complains of lower abdominal pain and rectal pain.  Pt reports symptoms began with exercise.  Pt has had ovarian cyst in the past   Review of Systems  Positive: Rectal and vaginal pain Negative: fever  Physical Exam  BP (!) 115/102   Pulse 87   Temp 99.3 F (37.4 C)   Resp 20   Ht 5' 3.5" (1.613 m)   Wt 81.6 kg   SpO2 99%   BMI 31.39 kg/m  Gen:   Awake, no distress   Resp:  Normal effort  MSK:   Moves extremities without difficulty  Other:  Tender lower abdominal pain   Medical Decision Making  Medically screening exam initiated at 3:08 PM.  Appropriate orders placed.  Anna Burns was informed that the remainder of the evaluation will be completed by another provider, this initial triage assessment does not replace that evaluation, and the importance of remaining in the ED until their evaluation is complete.     Anna Meadow, PA-C 09/03/22 [place], [place] 09/03/22 1512

## 2022-09-03 NOTE — ED Triage Notes (Signed)
Pt arrives via ems from home. Pt was doing a light workout today when she felt severe pain in her rectum and vagina. Denies any vaginal bleeding or any other associated symptoms. Pt reports 10/10 pain.

## 2022-09-18 ENCOUNTER — Inpatient Hospital Stay: Admission: RE | Admit: 2022-09-18 | Payer: Medicaid Other | Source: Ambulatory Visit

## 2023-04-24 ENCOUNTER — Encounter (HOSPITAL_BASED_OUTPATIENT_CLINIC_OR_DEPARTMENT_OTHER): Payer: Self-pay

## 2023-04-24 ENCOUNTER — Emergency Department (HOSPITAL_BASED_OUTPATIENT_CLINIC_OR_DEPARTMENT_OTHER): Payer: Medicaid Other

## 2023-04-24 ENCOUNTER — Emergency Department (HOSPITAL_BASED_OUTPATIENT_CLINIC_OR_DEPARTMENT_OTHER)
Admission: EM | Admit: 2023-04-24 | Discharge: 2023-04-24 | Discharge status: Against Medical Advice | Payer: Medicaid Other | Attending: Emergency Medicine | Admitting: Emergency Medicine

## 2023-04-24 ENCOUNTER — Other Ambulatory Visit: Payer: Self-pay

## 2023-04-24 DIAGNOSIS — R891 Abnormal level of hormones in specimens from other organs, systems and tissues: Secondary | ICD-10-CM | POA: Insufficient documentation

## 2023-04-24 DIAGNOSIS — O26851 Spotting complicating pregnancy, first trimester: Secondary | ICD-10-CM | POA: Diagnosis present

## 2023-04-24 DIAGNOSIS — R7989 Other specified abnormal findings of blood chemistry: Secondary | ICD-10-CM

## 2023-04-24 DIAGNOSIS — Z6720 Type B blood, Rh positive: Secondary | ICD-10-CM | POA: Insufficient documentation

## 2023-04-24 DIAGNOSIS — Z5329 Procedure and treatment not carried out because of patient's decision for other reasons: Secondary | ICD-10-CM | POA: Diagnosis not present

## 2023-04-24 DIAGNOSIS — Z3A01 Less than 8 weeks gestation of pregnancy: Secondary | ICD-10-CM | POA: Insufficient documentation

## 2023-04-24 DIAGNOSIS — R102 Pelvic and perineal pain: Secondary | ICD-10-CM | POA: Diagnosis not present

## 2023-04-24 DIAGNOSIS — N939 Abnormal uterine and vaginal bleeding, unspecified: Secondary | ICD-10-CM

## 2023-04-24 LAB — URINALYSIS, ROUTINE W REFLEX MICROSCOPIC
Bacteria, UA: NONE SEEN
Bilirubin Urine: NEGATIVE
Glucose, UA: NEGATIVE mg/dL
Hgb urine dipstick: NEGATIVE
Ketones, ur: NEGATIVE mg/dL
Leukocytes,Ua: NEGATIVE
Nitrite: NEGATIVE
Protein, ur: NEGATIVE mg/dL
Specific Gravity, Urine: 1.01 (ref 1.005–1.030)
pH: 6.5 (ref 5.0–8.0)

## 2023-04-24 LAB — CBC
HCT: 37.8 % (ref 36.0–46.0)
Hemoglobin: 12.1 g/dL (ref 12.0–15.0)
MCH: 25.1 pg — ABNORMAL LOW (ref 26.0–34.0)
MCHC: 32 g/dL (ref 30.0–36.0)
MCV: 78.4 fL — ABNORMAL LOW (ref 80.0–100.0)
Platelets: 314 10*3/uL (ref 150–400)
RBC: 4.82 MIL/uL (ref 3.87–5.11)
RDW: 13.8 % (ref 11.5–15.5)
WBC: 7.9 10*3/uL (ref 4.0–10.5)
nRBC: 0 % (ref 0.0–0.2)

## 2023-04-24 LAB — BASIC METABOLIC PANEL
Anion gap: 8 (ref 5–15)
BUN: 8 mg/dL (ref 6–20)
CO2: 25 mmol/L (ref 22–32)
Calcium: 9.5 mg/dL (ref 8.9–10.3)
Chloride: 103 mmol/L (ref 98–111)
Creatinine, Ser: 0.38 mg/dL — ABNORMAL LOW (ref 0.44–1.00)
GFR, Estimated: >60 mL/min (ref >60)
Glucose, Bld: 81 mg/dL (ref 70–99)
Potassium: 3.7 mmol/L (ref 3.5–5.1)
Sodium: 136 mmol/L (ref 135–145)

## 2023-04-24 LAB — WET PREP, GENITAL
Clue Cells Wet Prep HPF POC: NONE SEEN
Sperm: NONE SEEN
Trich, Wet Prep: NONE SEEN
WBC, Wet Prep HPF POC: <10 (ref <10)
Yeast Wet Prep HPF POC: NONE SEEN

## 2023-04-24 LAB — HCG, QUANTITATIVE, PREGNANCY: hCG, Beta Chain, Quant, S: 11850 m[IU]/mL — ABNORMAL HIGH (ref <5)

## 2023-04-24 LAB — ABO/RH: ABO/RH(D): B POS

## 2023-04-24 NOTE — Discharge Instructions (Addendum)
As discussed, follow MyChart for the results of your testing.  If you change your mind about ultrasound imaging, please return to the emergency department.  Otherwise, recommend follow-up with OB/GYN in the outpatient setting.

## 2023-04-24 NOTE — ED Triage Notes (Signed)
States vaginal bleeding started today.  [redacted] wk pregnant.  Some cramping States spotting.

## 2023-04-24 NOTE — ED Provider Notes (Signed)
[place] EMERGENCY DEPARTMENT AT [facility] Provider Note   CSN: [id number] Arrival date & time: 04/24/23  1323     History  Chief Complaint  Patient presents with   Vaginal Bleeding    W pregnancy     Anna Burns is a 32 y.o. female.   Vaginal Bleeding   32 year old female presents emergency department with complaints of pelvic pain, vaginal spotting.  Patient states his symptoms started today.  States since the end of her last menstrual period was around August 30.  [facility] with [facility] [place] for [facility] care.  Patient is a [id number].  Reports 2 other miscarriage with 2 other pregnancies being induced abortions.  Denies any fever, chills, chest pain, shortness of breath, urinary symptoms, change in bowel habits.  Patient does report nausea as well as emesis but seems to be isolated to morning time and has been present throughout pregnancy without any acute change over the past couple of days.  Past medical history significant for  Home Medications Prior to Admission medications   Medication Sig Start Date End Date Taking? Authorizing Provider  acetaminophen (TYLENOL) 500 MG tablet Take 1,000 mg by mouth every 6 (six) hours as needed.    [provider]  levonorgestrel (MIRENA) 20 MCG/DAY IUD 1 each by Intrauterine route once.    [provider]  methylPREDNISolone (MEDROL DOSEPAK) 4 MG TBPK tablet 6 tabs day 1, 5 tabs day 2, 4 tabs day 3, 3 tabs day 4, 2 tabs day 5, 1 tabs day 6 09/28/21   Windell Norfolk, MD  SUMAtriptan (IMITREX) 50 MG tablet Take 1 tablet (50 mg total) by mouth every 2 (two) hours as needed for migraine. May repeat in 2 hours if headache persists or recurs. 09/28/21   Windell Norfolk, MD  topiramate (TOPAMAX) 25 MG tablet Take 1 tablet (25 mg total) by mouth 2 (two) times daily. 09/28/21   Windell Norfolk, MD      Allergies    Contrast media [iodinated contrast media]    Review of Systems   Review of Systems   Genitourinary:  Positive for vaginal bleeding.  All other systems reviewed and are negative.   Physical Exam Updated Vital Signs BP 126/89 (BP Location: Right Arm)   Pulse 100   Temp 98 F (36.7 C)   Resp 16   Ht 5' 3.5" (1.613 m)   Wt 85.7 kg   SpO2 100%   BMI 32.95 kg/m  Physical Exam Vitals and nursing note reviewed. Exam conducted with a chaperone present.  Constitutional:      General: She is not in acute distress.    Appearance: She is well-developed.  HENT:     Head: Normocephalic and atraumatic.  Eyes:     Conjunctiva/sclera: Conjunctivae normal.  Cardiovascular:     Rate and Rhythm: Normal rate and regular rhythm.     Heart sounds: No murmur heard. Pulmonary:     Effort: Pulmonary effort is normal. No respiratory distress.     Breath sounds: Normal breath sounds.  Abdominal:     Palpations: Abdomen is soft.     Tenderness: There is no right CVA tenderness, left CVA tenderness or guarding.     Comments: Mild lower abdominal tenderness on exam.  Genitourinary:    Comments: Cervical os appears closed.  Whitish-yellowish appearing discharge in vaginal vault with very minimal streaky bright red blood appreciated.  No obvious bleeding from cervical os.  No lesions in vaginal vault.  Exam performed  with female nurse staff at bedside. Musculoskeletal:        General: No swelling.     Cervical back: Neck supple.  Skin:    General: Skin is warm and dry.     Capillary Refill: Capillary refill takes less than 2 seconds.  Neurological:     Mental Status: She is alert.  Psychiatric:        Mood and Affect: Mood normal.     ED Results / Procedures / Treatments   Labs (all labs ordered are listed, but only abnormal results are displayed) Labs Reviewed  CBC - Abnormal; Notable for the following components:      Result Value   MCV 78.4 (*)    MCH 25.1 (*)    All other components within normal limits  BASIC METABOLIC PANEL - Abnormal; Notable for the following  components:   Creatinine, Ser 0.38 (*)    All other components within normal limits  HCG, QUANTITATIVE, PREGNANCY - Abnormal; Notable for the following components:   hCG, Beta Chain, Quant, S 11,850 (*)    All other components within normal limits  URINALYSIS, ROUTINE W REFLEX MICROSCOPIC - Abnormal; Notable for the following components:   Color, Urine COLORLESS (*)    All other components within normal limits  WET PREP, GENITAL  ABO/RH    EKG None  Radiology No results found.  Procedures Procedures    Medications Ordered in ED Medications - No data to display  ED Course/ Medical Decision Making/ A&P Clinical Course as of 04/24/23 1721  Thu Apr 24, 2023  1718 While awaiting for ultrasound imaging, patient decided to leave.  Patient states that her son's birthday parties today and they are waiting on her to start the party.  I discussed with patient regarding risks of incomplete workup given known pregnancy without imaging studies for assessment of any potential abnormality.  Along with discussed increased morbidity/mortality potentially.  Patient knowledge understanding and still decided to leave AGAINST MEDICAL ADVICE. [CR]    Clinical Course User Index [CR] Peter Garter, PA                                  Medical Decision Making Amount and/or Complexity of Data Reviewed Labs: ordered. Radiology: ordered.   This patient presents to the ED for concern of pelvic pain, vaginal bleeding, this involves an extensive number of treatment options, and is a complaint that carries with it a high risk of complications and morbidity.  The differential diagnosis includes subchorionic hemorrhage, miscarriage, lesion, malignancy, ectopic pregnancy, ovarian torsion, tubo-ovarian abscess, other   Co morbidities that complicate the patient evaluation  See HPI   Additional history obtained:  Additional history obtained from EMR External records from outside source obtained  and reviewed including hospital records   Lab Tests:  I Ordered, and personally interpreted labs.  The pertinent results include: No leukocytosis.  No evidence of anemia.  Placed within range.  UA without abnormality.  No electrolyte abnormalities.  No renal dysfunction.  hCG elevated 11,850.  Rh/ABO pending.  Wet prep pending.   Imaging Studies ordered:  I ordered imaging studies including pelvic ultrasound pending   Cardiac Monitoring: / EKG:  The patient was maintained on a cardiac monitor.  I personally viewed and interpreted the cardiac monitored which showed an underlying rhythm of: Sinus with him   Consultations Obtained:  N/a   Problem List / ED Course /  Critical interventions / Medication management  Intrauterine pregnancy, pelvic pain, vaginal bleeding Reevaluation of the patient showed that the patient stayed the same I have reviewed the patients home medicines and have made adjustments as needed   Social Determinants of Health:  Denies tobacco, illicit drug use   Test / Admission - Considered:  Intrauterine pregnancy, pelvic pain, vaginal bleeding Vitals signs significant for hypertension blood pressure 148/81. Otherwise within normal range and stable throughout visit. Laboratory/imaging studies significant for: See above 32 year old female presents emergency department with complaints of pelvic pain/vaginal spotting.  Patient reportedly 5 weeks and 5 days pregnant.  On exam, patient with mild tenderness in suprapubic/midline pelvic region.  Speculum exam showed closed cervical os with very minimal streaky blood without gross exsanguination.  Labs reassuring.  While waiting for assessment via pelvic ultrasound, patient expressed desire to leave to go to her son's birthday party.  Discussion was had with patient at length regarding increased morbidity/mortality of incomplete workup given known pregnancy with pelvic pain and vaginal bleeding.  Patient knowledge  understanding and still desired to leave.  Will recommend return for pelvic ultrasound if patient changes her mind otherwise, follow-up with OB/GYN in the outpatient setting.         Final Clinical Impression(s) / ED Diagnoses Final diagnoses:  Pelvic pain  Elevated serum hCG  Vaginal spotting    Rx / DC Orders ED Discharge Orders     None         Peter Garter, [place] 04/24/23 1721    Eber Hong, MD 05/02/23 [phone]

## 2023-04-24 NOTE — ED Notes (Signed)
Pt left AMA... PT aware of the need to stay and understood the risk of leaving AMA... Provider aware and had a conversation with the PT.Marland KitchenMarland Kitchen

## 2023-05-13 ENCOUNTER — Other Ambulatory Visit (HOSPITAL_COMMUNITY): Payer: Self-pay | Admitting: Obstetrics and Gynecology

## 2023-05-13 DIAGNOSIS — O039 Complete or unspecified spontaneous abortion without complication: Secondary | ICD-10-CM

## 2023-05-14 NOTE — Progress Notes (Signed)
PCP - Jettie Pagan, NP  Cardiologist - denies  PPM/ICD - denies Device Orders - n/a Rep Notified - n/a  Chest x-ray - 09-26-21 EKG - 09-26-21 Stress Test - n/a ECHO - n/a Cardiac Cath - n/a  CPAP - n/a  DM - denies  Blood Thinner Instructions: denies Aspirin Instructions: denies  ERAS Protcol - clear liquids until 11:45  COVID TEST- no  Anesthesia review: np  Patient verbally denies any shortness of breath, fever, cough and chest pain during phone call   -------------  SDW INSTRUCTIONS given:  Your procedure is scheduled on May 16, 2023.  Report to [facility] Main Entrance "A" at 12:15 P.M., and check in at the Admitting office.  Call this number if you have problems the morning of surgery:  [phone]   Remember:  Do not eat after midnight the night before your surgery  You may drink clear liquids until 11:45 the morning of your surgery.   Clear liquids allowed are: Water, Non-Citrus Juices (without pulp), Carbonated Beverages, Clear Tea, Black Coffee Only, and Gatorade    Take these medicines the morning of surgery with A SIP OF WATER  N/a IF NEEDED   As of today, STOP taking any Aspirin (unless otherwise instructed by your surgeon) Aleve, Naproxen, Ibuprofen, Motrin, Advil, Goody's, BC's, all herbal medications, fish oil, and all vitamins.                      Do not wear jewelry, make up, or nail polish            Do not wear lotions, powders, perfumes/colognes, or deodorant.            Do not shave 48 hours prior to surgery.  Men may shave face and neck.            Do not bring valuables to the hospital.            [facility] is not responsible for any belongings or valuables.  Do NOT Smoke (Tobacco/Vaping) 24 hours prior to your procedure If you use a CPAP at night, you may bring all equipment for your overnight stay.   Contacts, glasses, dentures or bridgework may not be worn into surgery.      For patients admitted to the hospital,  discharge time will be determined by your treatment team.   Patients discharged the day of surgery will not be allowed to drive home, and someone needs to stay with them for 24 hours.    Special instructions:   [facility]- Preparing For Surgery  Before surgery, you can play an important role. Because skin is not sterile, your skin needs to be as free of germs as possible. You can reduce the number of germs on your skin by washing with CHG (chlorahexidine gluconate) Soap before surgery.  CHG is an antiseptic cleaner which kills germs and bonds with the skin to continue killing germs even after washing.    Oral Hygiene is also important to reduce your risk of infection.  Remember - BRUSH YOUR TEETH THE MORNING OF SURGERY WITH YOUR REGULAR TOOTHPASTE  Please do not use if you have an allergy to CHG or antibacterial soaps. If your skin becomes reddened/irritated stop using the CHG.  Do not shave (including legs and underarms) for at least 48 hours prior to first CHG shower. It is OK to shave your face.  Please follow these instructions carefully.   Shower the [employer] BEFORE SURGERY  and the MORNING OF SURGERY with DIAL Soap.   Pat yourself dry with a CLEAN TOWEL.  Wear CLEAN PAJAMAS to bed the night before surgery  Place CLEAN SHEETS on your bed the night of your first shower and DO NOT SLEEP WITH PETS.   Day of Surgery: Please shower morning of surgery  Wear Clean/Comfortable clothing the morning of surgery Do not apply any deodorants/lotions.   Remember to brush your teeth WITH YOUR REGULAR TOOTHPASTE.   Questions were answered. Patient verbalized understanding of instructions.

## 2023-05-15 ENCOUNTER — Encounter (HOSPITAL_COMMUNITY): Payer: Self-pay | Admitting: Obstetrics and Gynecology

## 2023-05-15 ENCOUNTER — Other Ambulatory Visit: Payer: Self-pay

## 2023-05-16 ENCOUNTER — Other Ambulatory Visit: Payer: Self-pay | Admitting: Obstetrics and Gynecology

## 2023-05-16 ENCOUNTER — Ambulatory Visit (HOSPITAL_BASED_OUTPATIENT_CLINIC_OR_DEPARTMENT_OTHER): Payer: Medicaid Other | Admitting: Anesthesiology

## 2023-05-16 ENCOUNTER — Ambulatory Visit (HOSPITAL_COMMUNITY)
Admission: RE | Admit: 2023-05-16 | Discharge: 2023-05-16 | Disposition: A | Discharge status: Home / Self Care | Payer: Medicaid Other | Attending: Obstetrics and Gynecology | Admitting: Obstetrics and Gynecology

## 2023-05-16 ENCOUNTER — Other Ambulatory Visit (HOSPITAL_COMMUNITY): Payer: Self-pay

## 2023-05-16 ENCOUNTER — Ambulatory Visit (HOSPITAL_COMMUNITY)
Admission: RE | Admit: 2023-05-16 | Discharge: 2023-05-16 | Disposition: A | Discharge status: Home / Self Care | Payer: Medicaid Other | Source: Ambulatory Visit | Attending: Obstetrics and Gynecology | Admitting: Obstetrics and Gynecology

## 2023-05-16 ENCOUNTER — Encounter (HOSPITAL_COMMUNITY)
Admission: RE | Disposition: A | Discharge status: Home / Self Care | Payer: Self-pay | Source: Home / Self Care | Attending: Obstetrics and Gynecology

## 2023-05-16 ENCOUNTER — Ambulatory Visit (HOSPITAL_COMMUNITY): Payer: Medicaid Other | Admitting: Anesthesiology

## 2023-05-16 DIAGNOSIS — J45909 Unspecified asthma, uncomplicated: Secondary | ICD-10-CM | POA: Diagnosis not present

## 2023-05-16 DIAGNOSIS — I10 Essential (primary) hypertension: Secondary | ICD-10-CM

## 2023-05-16 DIAGNOSIS — O021 Missed abortion: Secondary | ICD-10-CM | POA: Insufficient documentation

## 2023-05-16 DIAGNOSIS — F419 Anxiety disorder, unspecified: Secondary | ICD-10-CM | POA: Insufficient documentation

## 2023-05-16 DIAGNOSIS — F32A Depression, unspecified: Secondary | ICD-10-CM | POA: Insufficient documentation

## 2023-05-16 DIAGNOSIS — Z3A00 Weeks of gestation of pregnancy not specified: Secondary | ICD-10-CM | POA: Diagnosis not present

## 2023-05-16 DIAGNOSIS — O039 Complete or unspecified spontaneous abortion without complication: Secondary | ICD-10-CM

## 2023-05-16 HISTORY — DX: Anxiety disorder, unspecified: F41.9

## 2023-05-16 HISTORY — PX: DILATION AND EVACUATION: SHX1459

## 2023-05-16 HISTORY — DX: Depression, unspecified: F32.A

## 2023-05-16 HISTORY — DX: Other specified postprocedural states: Z98.890

## 2023-05-16 LAB — CBC
HCT: 38.2 % (ref 36.0–46.0)
Hemoglobin: 12 g/dL (ref 12.0–15.0)
MCH: 24.7 pg — ABNORMAL LOW (ref 26.0–34.0)
MCHC: 31.4 g/dL (ref 30.0–36.0)
MCV: 78.8 fL — ABNORMAL LOW (ref 80.0–100.0)
Platelets: 340 10*3/uL (ref 150–400)
RBC: 4.85 MIL/uL (ref 3.87–5.11)
RDW: 13.3 % (ref 11.5–15.5)
WBC: 7.8 10*3/uL (ref 4.0–10.5)
nRBC: 0 % (ref 0.0–0.2)

## 2023-05-16 LAB — TYPE AND SCREEN
ABO/RH(D): B POS
Antibody Screen: NEGATIVE

## 2023-05-16 SURGERY — DILATION AND EVACUATION, UTERUS
Anesthesia: General | Site: Uterus

## 2023-05-16 MED ORDER — SODIUM CHLORIDE 0.9 % IV SOLN: INTRAVENOUS | Status: DC | PRN

## 2023-05-16 MED ORDER — FENTANYL CITRATE (PF) 250 MCG/5ML IJ SOLN
INTRAMUSCULAR | Status: AC
  Filled 2023-05-16: qty 5

## 2023-05-16 MED ORDER — ONDANSETRON HCL 4 MG/2ML IJ SOLN
4.0000 mg | Freq: Once | INTRAMUSCULAR | Status: AC | PRN
  Administered 2023-05-16: 4 mg via INTRAVENOUS

## 2023-05-16 MED ORDER — PROPOFOL 10 MG/ML IV BOLUS
INTRAVENOUS | Status: DC | PRN
  Administered 2023-05-16: 200 mg via INTRAVENOUS

## 2023-05-16 MED ORDER — LIDOCAINE HCL 2 % IJ SOLN
INTRAMUSCULAR | Status: DC | PRN
  Administered 2023-05-16: 10 mL

## 2023-05-16 MED ORDER — OXYCODONE HCL 5 MG PO TABS
5.0000 mg | ORAL_TABLET | Freq: Four times a day (QID) | ORAL | 0 refills | Status: AC | PRN
  Filled 2023-05-16: qty 10, 3d supply, fill #0

## 2023-05-16 MED ORDER — DEXAMETHASONE SODIUM PHOSPHATE 10 MG/ML IJ SOLN
INTRAMUSCULAR | Status: DC | PRN
  Administered 2023-05-16: 10 mg via INTRAVENOUS

## 2023-05-16 MED ORDER — FENTANYL CITRATE (PF) 100 MCG/2ML IJ SOLN
INTRAMUSCULAR | Status: AC
  Filled 2023-05-16: qty 2

## 2023-05-16 MED ORDER — MEPERIDINE HCL 25 MG/ML IJ SOLN: 6.2500 mg | INTRAMUSCULAR | Status: DC | PRN

## 2023-05-16 MED ORDER — PROPOFOL 10 MG/ML IV BOLUS
INTRAVENOUS | Status: AC
  Filled 2023-05-16: qty 20

## 2023-05-16 MED ORDER — DEXAMETHASONE SODIUM PHOSPHATE 10 MG/ML IJ SOLN
INTRAMUSCULAR | Status: AC
  Filled 2023-05-16: qty 1

## 2023-05-16 MED ORDER — FENTANYL CITRATE (PF) 100 MCG/2ML IJ SOLN
INTRAMUSCULAR | Status: DC | PRN
  Administered 2023-05-16: 50 ug via INTRAVENOUS
  Administered 2023-05-16 (×2): 100 ug via INTRAVENOUS

## 2023-05-16 MED ORDER — IBUPROFEN 600 MG PO TABS
600.0000 mg | ORAL_TABLET | Freq: Four times a day (QID) | ORAL | 1 refills | Status: AC | PRN
  Filled 2023-05-16: qty 40, 10d supply, fill #0

## 2023-05-16 MED ORDER — MIDAZOLAM HCL 2 MG/2ML IJ SOLN: 2.0000 mg | Freq: Once | INTRAMUSCULAR | Status: AC

## 2023-05-16 MED ORDER — LIDOCAINE HCL 2 % IJ SOLN
INTRAMUSCULAR | Status: AC
  Filled 2023-05-16: qty 20

## 2023-05-16 MED ORDER — ACETAMINOPHEN 500 MG PO TABS
1000.0000 mg | ORAL_TABLET | Freq: Four times a day (QID) | ORAL | 1 refills | Status: AC | PRN
  Filled 2023-05-16: qty 40, 5d supply, fill #0

## 2023-05-16 MED ORDER — LABETALOL HCL 5 MG/ML IV SOLN
INTRAVENOUS | Status: DC | PRN
  Administered 2023-05-16: 5 mg via INTRAVENOUS

## 2023-05-16 MED ORDER — FENTANYL CITRATE (PF) 100 MCG/2ML IJ SOLN
25.0000 ug | INTRAMUSCULAR | Status: DC | PRN
  Administered 2023-05-16 (×2): 25 ug via INTRAVENOUS

## 2023-05-16 MED ORDER — LACTATED RINGERS IV SOLN: INTRAVENOUS | Status: DC | PRN

## 2023-05-16 MED ORDER — ACETAMINOPHEN 325 MG PO TABS: 325.0000 mg | ORAL_TABLET | ORAL | Status: DC | PRN

## 2023-05-16 MED ORDER — OXYCODONE HCL 5 MG/5ML PO SOLN: 5.0000 mg | Freq: Once | ORAL | Status: DC | PRN

## 2023-05-16 MED ORDER — MIDAZOLAM HCL 2 MG/2ML IJ SOLN
INTRAMUSCULAR | Status: AC
  Administered 2023-05-16: 2 mg via INTRAVENOUS
  Filled 2023-05-16: qty 2

## 2023-05-16 MED ORDER — LIDOCAINE HCL (CARDIAC) PF 100 MG/5ML IV SOSY
PREFILLED_SYRINGE | INTRAVENOUS | Status: DC | PRN
  Administered 2023-05-16: 60 mg via INTRAVENOUS

## 2023-05-16 MED ORDER — CEFAZOLIN SODIUM-DEXTROSE 2-4 GM/100ML-% IV SOLN
INTRAVENOUS | Status: AC
  Filled 2023-05-16: qty 100

## 2023-05-16 MED ORDER — ONDANSETRON HCL 4 MG/2ML IJ SOLN
INTRAMUSCULAR | Status: DC | PRN
  Administered 2023-05-16: 4 mg via INTRAVENOUS

## 2023-05-16 MED ORDER — POVIDONE-IODINE 10 % EX SWAB: 2.0000 | Freq: Once | CUTANEOUS | Status: DC

## 2023-05-16 MED ORDER — ONDANSETRON HCL 4 MG/2ML IJ SOLN
INTRAMUSCULAR | Status: AC
  Filled 2023-05-16: qty 2

## 2023-05-16 MED ORDER — OXYCODONE HCL 5 MG PO TABS
5.0000 mg | ORAL_TABLET | Freq: Once | ORAL | Status: DC | PRN
Start: 2023-05-16 — End: 2023-05-16

## 2023-05-16 MED ORDER — ORAL CARE MOUTH RINSE: 15.0000 mL | Freq: Once | OROMUCOSAL | Status: AC

## 2023-05-16 MED ORDER — ACETAMINOPHEN 160 MG/5ML PO SOLN: 325.0000 mg | ORAL | Status: DC | PRN

## 2023-05-16 MED ORDER — CHLORHEXIDINE GLUCONATE 0.12 % MT SOLN
15.0000 mL | Freq: Once | OROMUCOSAL | Status: AC
Start: 2023-05-16 — End: 2023-05-16
  Administered 2023-05-16: 15 mL via OROMUCOSAL
  Filled 2023-05-16: qty 15

## 2023-05-16 MED ORDER — MIDAZOLAM HCL 2 MG/2ML IJ SOLN
INTRAMUSCULAR | Status: AC
  Filled 2023-05-16: qty 2

## 2023-05-16 SURGICAL SUPPLY — 20 items
CATH ROBINSON RED A/P 16FR (CATHETERS) ×2 IMPLANT
FILTER UTR ASPR ASSEMBLY (MISCELLANEOUS) ×2 IMPLANT
GLOVE BIO SURGEON STRL SZ7.5 (GLOVE) ×2 IMPLANT
GLOVE BIOGEL PI IND STRL 7.0 (GLOVE) ×2 IMPLANT
GLOVE BIOGEL PI IND STRL 7.5 (GLOVE) ×2 IMPLANT
GOWN STRL REUS W/ TWL LRG LVL3 (GOWN DISPOSABLE) ×4 IMPLANT
GOWN STRL REUS W/TWL LRG LVL3 (GOWN DISPOSABLE) ×4
HOSE CONNECTING 18IN BERKELEY (TUBING) ×2 IMPLANT
KIT BERKELEY 1ST TRIMESTER 3/8 (MISCELLANEOUS) ×4 IMPLANT
NS IRRIG 1000ML POUR BTL (IV SOLUTION) ×2 IMPLANT
PACK VAGINAL MINOR WOMEN LF (CUSTOM PROCEDURE TRAY) ×2 IMPLANT
PAD OB MATERNITY 4.3X12.25 (PERSONAL CARE ITEMS) ×2 IMPLANT
SET BERKELEY SUCTION TUBING (SUCTIONS) ×2 IMPLANT
SPIKE FLUID TRANSFER (MISCELLANEOUS) ×2 IMPLANT
TOWEL GREEN STERILE FF (TOWEL DISPOSABLE) ×4 IMPLANT
UNDERPAD 30X36 HEAVY ABSORB (UNDERPADS AND DIAPERS) ×2 IMPLANT
VACURETTE 10 RIGID CVD (CANNULA) IMPLANT
VACURETTE 7MM CVD STRL WRAP (CANNULA) IMPLANT
VACURETTE 8 RIGID CVD (CANNULA) IMPLANT
VACURETTE 9 RIGID CVD (CANNULA) ×1 IMPLANT

## 2023-05-16 NOTE — H&P (Signed)
Anna Burns is an 32 y.o. female. Pt presenting for suction D&C d/t missed abortion.  Pertinent Gynecological History: H/o regular cycles OB History: G8, [id number]   Menstrual History:  Patient's last menstrual period was 03/13/2023.    Past Medical History:  Diagnosis Date   Anemia    Anxiety    Asthma    Depression    Fibroid    Genital herpes simplex 02/27/2018   Gestational HTN 04/22/2018   Gestational hypertension 04/22/2018   Ovarian tumor (benign), left    PONV (postoperative nausea and vomiting)    Pregnancy induced hypertension    Pregnant 03/27/2017   SAB (spontaneous abortion) 04/04/2017    Past Surgical History:  Procedure Laterality Date   ELBOW SURGERY     LEFT OOPHORECTOMY  2013    Family History  Problem Relation Age of Onset   Migraines Mother    Aneurysm Mother    Diabetes Mother    Stroke Mother    Diabetes Father    Diabetes Paternal Grandmother    Tourette syndrome Daughter     Social History:  reports that she has never smoked. She has never used smokeless tobacco. She reports that she does not drink alcohol and does not use drugs.  Allergies:  Allergies  Allergen Reactions   Contrast Media [Iodinated Contrast Media] Itching and Other (See Comments)    Itching on tongue    Medications Prior to Admission  Medication Sig Dispense Refill Last Dose   acetaminophen (TYLENOL) 500 MG tablet Take 1,000 mg by mouth every 6 (six) hours as needed for mild pain (pain score 1-3) or moderate pain (pain score 4-6).      ALPRAZolam (XANAX) 0.25 MG tablet Take 0.25 mg by mouth 2 (two) times daily.      methylPREDNISolone (MEDROL DOSEPAK) 4 MG TBPK tablet 6 tabs day 1, 5 tabs day 2, 4 tabs day 3, 3 tabs day 4, 2 tabs day 5, 1 tabs day 6 (Patient not taking: Reported on 05/15/2023) 21 tablet 0 Not Taking   SUMAtriptan (IMITREX) 50 MG tablet Take 1 tablet (50 mg total) by mouth every 2 (two) hours as needed for migraine. May repeat in 2 hours if headache  persists or recurs. (Patient not taking: Reported on 05/15/2023) 10 tablet 3 Not Taking   topiramate (TOPAMAX) 25 MG tablet Take 1 tablet (25 mg total) by mouth 2 (two) times daily. (Patient not taking: Reported on 05/15/2023) 30 tablet 6 Not Taking    Review of Systems Denies F/C/N/V/D  Blood pressure 128/82, pulse 82, temperature 98.1 F (36.7 C), temperature source Oral, resp. rate 18, height 5\' 4"  (1.626 m), weight 86.2 kg, last menstrual period 03/13/2023, SpO2 100%. Physical Exam Lungs unlabored CV RRR Abdomen soft, NT Extremities no calf tenderness  No results found for this or any previous visit (from the past 24 hour(s)).  No results found.  Assessment/Plan: 40HK [id number] with missed abortion presenting for suction D&C.  Risks benefits and alternatives reviewed with the patient including but not limited to bleeding infection and injury.  Questions answered and consent signed and witnessed.  Anna Burns 05/16/2023, 1:34 PM

## 2023-05-16 NOTE — Anesthesia Procedure Notes (Signed)
Procedure Name: Intubation Date/Time: 05/16/2023 2:09 PM  Performed by: Renford Dills, CRNAPre-anesthesia Checklist: Patient identified, Patient being monitored, Timeout performed, Emergency Drugs available and Suction available Patient Re-evaluated:Patient Re-evaluated prior to induction Oxygen Delivery Method: Circle System Utilized Preoxygenation: Pre-oxygenation with 100% oxygen Induction Type: IV induction Ventilation: Mask ventilation without difficulty Laryngoscope Size: Miller and 2 Grade View: Grade II Tube type: Oral Tube size: 7.0 mm Number of attempts: 1 Airway Equipment and Method: stylet Placement Confirmation: ETT inserted through vocal cords under direct vision, positive ETCO2 and breath sounds checked- equal and bilateral Secured at: 21 cm Tube secured with: Tape Dental Injury: Teeth and Oropharynx as per pre-operative assessment

## 2023-05-16 NOTE — Anesthesia Preprocedure Evaluation (Signed)
Anesthesia Evaluation  Patient identified by MRN, date of birth, ID band Patient awake    Reviewed: Allergy & Precautions, Patient's Chart, lab work & pertinent test results  History of Anesthesia Complications (+) PONV and history of anesthetic complications  Airway Mallampati: II  TM Distance: >3 FB Neck ROM: Full    Dental no notable dental hx.    Pulmonary asthma    Pulmonary exam normal        Cardiovascular hypertension, Normal cardiovascular exam     Neuro/Psych  Headaches PSYCHIATRIC DISORDERS Anxiety Depression     negative psych ROS   GI/Hepatic negative GI ROS, Neg liver ROS,,,  Endo/Other  negative endocrine ROS    Renal/GU negative Renal ROS  negative genitourinary   Musculoskeletal negative musculoskeletal ROS (+)    Abdominal   Peds  Hematology  (+) Blood dyscrasia, anemia Hgb 8.8   Anesthesia Other Findings Day of surgery medications reviewed with patient.  Reproductive/Obstetrics (+) Pregnancy                             Anesthesia Physical Anesthesia Plan  ASA: 3  Anesthesia Plan: General   Post-op Pain Management: Minimal or no pain anticipated   Induction: Intravenous  PONV Risk Score and Plan: 4 or greater and Ondansetron, Dexamethasone and Treatment may vary due to age or medical condition  Airway Management Planned: LMA and Oral ETT  Additional Equipment: None  Intra-op Plan:   Post-operative Plan: Extubation in OR  Informed Consent: I have reviewed the patients History and Physical, chart, labs and discussed the procedure including the risks, benefits and alternatives for the proposed anesthesia with the patient or authorized representative who has indicated his/her understanding and acceptance.       Plan Discussed with: Anesthesiologist and CRNA  Anesthesia Plan Comments: (  )        Anesthesia Quick Evaluation

## 2023-05-16 NOTE — Transfer of Care (Signed)
Immediate Anesthesia Transfer of Care Note  Patient: Elspeth Ferrie  Procedure(s) Performed: DILATATION AND EVACUATION (Uterus)  Patient Location: PACU  Anesthesia Type:General  Level of Consciousness: awake  Airway & Oxygen Therapy: Patient Spontanous Breathing and Patient connected to nasal cannula oxygen  Post-op Assessment: Report given to RN and Post -op Vital signs reviewed and stable  Post vital signs: Reviewed and stable  Last Vitals:  Vitals Value Taken Time  BP 154/97 05/16/23 1438  Temp 37.2 C 05/16/23 1438  Pulse 99 05/16/23 1441  Resp 17 05/16/23 1441  SpO2 94 % 05/16/23 1441  Vitals shown include unfiled device data.  Last Pain:  Vitals:   05/16/23 1340  TempSrc:   PainSc: 0-No pain         Complications: No notable events documented.

## 2023-05-16 NOTE — Op Note (Signed)
Preop Diagnosis: Missed SAB  Postop Diagnosis: Missed SAB   Procedure: SUCTION DILATATION AND CURETTAGE  Anesthesia: General   Anesthesiologist: Bethena Midget, MD   Attending: Osborn Coho, MD   Assistant: N/a  Findings: Mod POCs  Pathology: POCs  Fluids: 400 cc  UOP: 100 cc via straight cath prior to procedure  EBL: Minimal <5cc  Complications: None  Procedure: The patient was taken to the operating room after the risks benefits and alternatives were discussed with the patient, the patient verbalized understanding and consent signed and witnessed.  The patient was placed under MAC anesthesia, prepped and draped in the normal sterile fashion and a time out was performed.  A bivalve speculum was placed in the patient's vagina and the anterior lip of the cervix grasped with a single-tooth tenaculum. A paracervical block was administered using a total of 10 cc of 2% lidocaine.  The uterus was sounded to 10 cm and a size 9 suction curette was used. Suction curettage was performed until minimal tissue returned. Sharp curettage was performed until a gritty texture was noted. Suction curettage was performed once again to remove any remaining debris. All instruments were removed. The count was correct. The patient was transferred to the recovery room in good condition.

## 2023-05-16 NOTE — Anesthesia Postprocedure Evaluation (Signed)
Anesthesia Post Note  Patient: Anna Burns  Procedure(s) Performed: DILATATION AND EVACUATION (Uterus)     Patient location during evaluation: PACU Anesthesia Type: General Level of consciousness: awake and alert Pain management: pain level controlled Vital Signs Assessment: post-procedure vital signs reviewed and stable Respiratory status: spontaneous breathing, nonlabored ventilation, respiratory function stable and patient connected to nasal cannula oxygen Cardiovascular status: blood pressure returned to baseline and stable Postop Assessment: no apparent nausea or vomiting Anesthetic complications: no   No notable events documented.  Last Vitals:  Vitals:   05/16/23 1520 05/16/23 1530  BP:  137/84  Pulse: 81 84  Resp: 17 11  Temp:  37.1 C  SpO2: 98% 99%    Last Pain:  Vitals:   05/16/23 1500  TempSrc:   PainSc: 10-Worst pain ever                 Jaydeen Odor

## 2023-05-17 ENCOUNTER — Encounter (HOSPITAL_COMMUNITY): Payer: Self-pay | Admitting: Obstetrics and Gynecology

## 2023-05-19 LAB — SURGICAL PATHOLOGY

## 2024-02-17 ENCOUNTER — Emergency Department (HOSPITAL_BASED_OUTPATIENT_CLINIC_OR_DEPARTMENT_OTHER)

## 2024-02-17 ENCOUNTER — Emergency Department (HOSPITAL_BASED_OUTPATIENT_CLINIC_OR_DEPARTMENT_OTHER)
Admission: EM | Admit: 2024-02-17 | Discharge: 2024-02-17 | Disposition: A | Discharge status: Home / Self Care | Attending: Emergency Medicine | Admitting: Emergency Medicine

## 2024-02-17 ENCOUNTER — Other Ambulatory Visit: Payer: Self-pay

## 2024-02-17 ENCOUNTER — Encounter (HOSPITAL_BASED_OUTPATIENT_CLINIC_OR_DEPARTMENT_OTHER): Payer: Self-pay

## 2024-02-17 DIAGNOSIS — W228XXA Striking against or struck by other objects, initial encounter: Secondary | ICD-10-CM | POA: Diagnosis not present

## 2024-02-17 DIAGNOSIS — S6991XA Unspecified injury of right wrist, hand and finger(s), initial encounter: Secondary | ICD-10-CM | POA: Diagnosis present

## 2024-02-17 DIAGNOSIS — S60211A Contusion of right wrist, initial encounter: Secondary | ICD-10-CM | POA: Insufficient documentation

## 2024-02-17 DIAGNOSIS — J45909 Unspecified asthma, uncomplicated: Secondary | ICD-10-CM | POA: Diagnosis not present

## 2024-02-17 NOTE — Discharge Instructions (Addendum)
 For pain control you may take 1000 mg of acetaminophen (Tylenol) every 8 hours and/or 600 mg of Ibuprofen (Motrin, Advil, etc.) every 6-8 hours as needed.  Please limit acetaminophen (Tylenol) to 4000 mg and Ibuprofen (Motrin, Advil, etc.) to 2400 mg for a 24hr period. Please note that other over-the-counter medicine may contain acetaminophen or ibuprofen as a component of their ingredients.

## 2024-02-17 NOTE — ED Triage Notes (Signed)
 Pt POV d/t right wrist injury.  Pt has pain and swelling - hit on counter as she was frustrated with the garbage disposal.

## 2024-02-17 NOTE — ED Notes (Signed)
Pt refused tylenol and ibuprofen

## 2024-02-17 NOTE — ED Provider Notes (Signed)
  EMERGENCY DEPARTMENT AT [facility] Provider Note  CSN: [id number] Arrival date & time: 02/17/24 0047  Chief Complaint(s) Wrist Injury  HPI Anna Burns is a 33 y.o. female here with right wrist pain and swelling with ecchymosis after banging her hand on the kitchen counter after getting frustrated while trying to clean the garbage disposal.  Pain was immediate and worse with palpation and range of motion.  Swelling has improved with cool compress.  No numbness or tingling.  Able to move fingers.  No other physical complaints  The history is provided by the patient.    Past Medical History Past Medical History:  Diagnosis Date   Anemia    Anxiety    Asthma    Depression    Fibroid    Genital herpes simplex 02/27/2018   Gestational HTN 04/22/2018   Gestational hypertension 04/22/2018   Ovarian tumor (benign), left    PONV (postoperative nausea and vomiting)    Pregnancy induced hypertension    Pregnant 03/27/2017   SAB (spontaneous abortion) 04/04/2017   Patient Active Problem List   Diagnosis Date Noted   IDA (iron  deficiency anemia) 11/11/2020   SVD (spontaneous vaginal delivery) 11/10/2020   Preterm labor in third trimester 11/07/2020   Genital herpes simplex 02/27/2018   Asthma 04/04/2017   H/O unilateral salpingectomy 04/04/2017   Home Medication(s) Prior to Admission medications   Medication Sig Start Date End Date Taking? Authorizing Provider  acetaminophen  (TYLENOL ) 500 MG tablet Take 2 tablets (1,000 mg total) by mouth every 6 (six) hours as needed for mild pain (pain score 1-3) or moderate pain (pain score 4-6). 05/16/23   Henry Slough, MD  ALPRAZolam (XANAX) 0.25 MG tablet Take 0.25 mg by mouth 2 (two) times daily. 05/12/23   [provider]  ibuprofen  (ADVIL ) 600 MG tablet Take 1 tablet (600 mg total) by mouth every 6 (six) hours as needed for mild pain (pain score 1-3), moderate pain (pain score 4-6) or cramping. 05/16/23    Henry Slough, MD  methylPREDNISolone  (MEDROL  DOSEPAK) 4 MG TBPK tablet 6 tabs day 1, 5 tabs day 2, 4 tabs day 3, 3 tabs day 4, 2 tabs day 5, 1 tabs day 6 Patient not taking: Reported on 05/15/2023 09/28/21   Camara, Amadou, MD  oxyCODONE  (ROXICODONE ) 5 MG immediate release tablet Take 1 tablet (5 mg total) by mouth every 6 (six) hours as needed for severe pain (pain score 7-10) or breakthrough pain. 05/16/23   Henry Slough, MD  SUMAtriptan  (IMITREX ) 50 MG tablet Take 1 tablet (50 mg total) by mouth every 2 (two) hours as needed for migraine. May repeat in 2 hours if headache persists or recurs. Patient not taking: Reported on 05/15/2023 09/28/21   Camara, Amadou, MD  topiramate  (TOPAMAX ) 25 MG tablet Take 1 tablet (25 mg total) by mouth 2 (two) times daily. Patient not taking: Reported on 05/15/2023 09/28/21   Camara, Amadou, MD  Allergies Contrast media [iodinated contrast media]  Review of Systems Review of Systems As noted in HPI  Physical Exam Vital Signs  I have reviewed the triage vital signs BP (!) 127/101   Pulse 87   Temp 97.7 F (36.5 C) (Oral)   Resp 18   Ht 5' 4 (1.626 m)   Wt 83 kg   LMP 01/18/2024   SpO2 99%   BMI 31.41 kg/m   Physical Exam Vitals reviewed.  Constitutional:      General: She is not in acute distress.    Appearance: She is well-developed. She is not diaphoretic.  HENT:     Head: Normocephalic and atraumatic.     Right Ear: External ear normal.     Left Ear: External ear normal.     Nose: Nose normal.  Eyes:     General: No scleral icterus.    Conjunctiva/sclera: Conjunctivae normal.  Neck:     Trachea: Phonation normal.  Cardiovascular:     Rate and Rhythm: Normal rate and regular rhythm.  Pulmonary:     Effort: Pulmonary effort is normal. No respiratory distress.     Breath sounds: No stridor.  Abdominal:      General: There is no distension.  Musculoskeletal:        General: Normal range of motion.     Right wrist: Swelling and tenderness present. No deformity, lacerations, bony tenderness or snuff box tenderness. Normal range of motion. Normal pulse.       Hands:     Cervical back: Normal range of motion.  Neurological:     Mental Status: She is alert and oriented to person, place, and time.  Psychiatric:        Behavior: Behavior normal.     ED Results and Treatments Labs (all labs ordered are listed, but only abnormal results are displayed) Labs Reviewed - No data to display                                                                                                                       EKG  EKG Interpretation Date/Time:    Ventricular Rate:    PR Interval:    QRS Duration:    QT Interval:    QTC Calculation:   R Axis:      Text Interpretation:         Radiology DG Wrist Complete Right Result Date: 02/17/2024 CLINICAL DATA:  injury Pt POV d/t right wrist injury. Pt has pain and swelling - hit on counter as she was frustrated with the garbage disposal EXAM: RIGHT WRIST - COMPLETE 3+ VIEW COMPARISON:  None Available. FINDINGS: There is no evidence of fracture or dislocation. There is no evidence of arthropathy or other focal bone abnormality. Medial subcutaneus soft tissue edema. IMPRESSION: No acute displaced fracture or dislocation. Electronically Signed   By: Morgane  Naveau M.D.   On: 02/17/2024 01:29    Medications Ordered in ED Medications - No data to display Procedures Procedures  (  including critical care time) Medical Decision Making / ED Course   Medical Decision Making Amount and/or Complexity of Data Reviewed Radiology: ordered and independent interpretation performed. Decision-making details documented in ED Course.    Right wrist pain.  Differential diagnosis considered.  X-ray negative for acute fracture or dislocation.  Consistent with contusion.   RICE recommended.    Final Clinical Impression(s) / ED Diagnoses Final diagnoses:  Contusion of right wrist, initial encounter   The patient appears reasonably screened and/or stabilized for discharge and I doubt any other medical condition or other [facility] requiring further screening, evaluation, or treatment in the ED at this time. I have discussed the findings, Dx and Tx plan with the patient/family who expressed understanding and agree(s) with the plan. Discharge instructions discussed at length. The patient/family was given strict return precautions who verbalized understanding of the instructions. No further questions at time of discharge.  Disposition: Discharge  Condition: Good  ED Discharge Orders     None        Follow Up: Arby Lyle LABOR, NP [address] [place] [address] [phone]  Call  to schedule an appointment for close follow up    This chart was dictated using voice recognition software.  Despite best efforts to proofread,  errors can occur which can change the documentation meaning.    Trine Raynell Moder, MD 02/17/24 0300

## 2024-03-23 ENCOUNTER — Other Ambulatory Visit: Payer: Self-pay

## 2024-03-23 ENCOUNTER — Encounter (HOSPITAL_COMMUNITY): Payer: Self-pay | Admitting: *Deleted

## 2024-03-23 ENCOUNTER — Emergency Department (HOSPITAL_COMMUNITY)

## 2024-03-23 ENCOUNTER — Emergency Department (HOSPITAL_COMMUNITY)
Admission: EM | Admit: 2024-03-23 | Discharge: 2024-03-23 | Discharge status: Against Medical Advice | Attending: Emergency Medicine | Admitting: Emergency Medicine

## 2024-03-23 DIAGNOSIS — R0602 Shortness of breath: Secondary | ICD-10-CM | POA: Insufficient documentation

## 2024-03-23 DIAGNOSIS — Z5321 Procedure and treatment not carried out due to patient leaving prior to being seen by health care provider: Secondary | ICD-10-CM | POA: Insufficient documentation

## 2024-03-23 DIAGNOSIS — R079 Chest pain, unspecified: Secondary | ICD-10-CM | POA: Diagnosis present

## 2024-03-23 LAB — BASIC METABOLIC PANEL WITH GFR
Anion gap: 12 (ref 5–15)
BUN: 7 mg/dL (ref 6–20)
CO2: 20 mmol/L — ABNORMAL LOW (ref 22–32)
Calcium: 9 mg/dL (ref 8.9–10.3)
Chloride: 100 mmol/L (ref 98–111)
Creatinine, Ser: 0.44 mg/dL (ref 0.44–1.00)
GFR, Estimated: >60 mL/min (ref >60)
Glucose, Bld: 96 mg/dL (ref 70–99)
Potassium: 3.8 mmol/L (ref 3.5–5.1)
Sodium: 132 mmol/L — ABNORMAL LOW (ref 135–145)

## 2024-03-23 LAB — CBC
HCT: 36.8 % (ref 36.0–46.0)
Hemoglobin: 11.4 g/dL — ABNORMAL LOW (ref 12.0–15.0)
MCH: 24.7 pg — ABNORMAL LOW (ref 26.0–34.0)
MCHC: 31 g/dL (ref 30.0–36.0)
MCV: 79.8 fL — ABNORMAL LOW (ref 80.0–100.0)
Platelets: 338 K/uL (ref 150–400)
RBC: 4.61 MIL/uL (ref 3.87–5.11)
RDW: 13.8 % (ref 11.5–15.5)
WBC: 7.6 K/uL (ref 4.0–10.5)
nRBC: 0 % (ref 0.0–0.2)

## 2024-03-23 LAB — I-STAT CHEM 8, ED
BUN: 6 mg/dL (ref 6–20)
Calcium, Ion: 1.21 mmol/L (ref 1.15–1.40)
Chloride: 104 mmol/L (ref 98–111)
Creatinine, Ser: 0.5 mg/dL (ref 0.44–1.00)
Glucose, Bld: 101 mg/dL — ABNORMAL HIGH (ref 70–99)
HCT: 35 % — ABNORMAL LOW (ref 36.0–46.0)
Hemoglobin: 11.9 g/dL — ABNORMAL LOW (ref 12.0–15.0)
Potassium: 4.1 mmol/L (ref 3.5–5.1)
Sodium: 140 mmol/L (ref 135–145)
TCO2: 23 mmol/L (ref 22–32)

## 2024-03-23 LAB — TROPONIN I (HIGH SENSITIVITY): Troponin I (High Sensitivity): <2 ng/L (ref <18)

## 2024-03-23 LAB — HCG, SERUM, QUALITATIVE: Preg, Serum: NEGATIVE

## 2024-03-23 NOTE — ED Notes (Addendum)
 Called for PT twice for vital check... no response

## 2024-03-23 NOTE — ED Triage Notes (Signed)
 Pt has been having left sided chest pain with radiation to left arm and shoulder x2 weeks.  Pain is constant. Pt has some sob with this.  No birth control or long distance travel

## 2024-03-23 NOTE — ED Provider Triage Note (Signed)
 Emergency Medicine Provider Triage Evaluation Note  Anna Burns , a 33 y.o. female  was evaluated in triage.  Pt complains of chest pain a left arm pain x this morning.  Sharp to the chest radiating to the left shoulder and feeling tingling to the left arm which is now improved.  Works as a Development worker, international aid.  Feels like overall malaise now.  Also was feeling short of breath previously.  No weight gain, no leg swelling.  Review of Systems  Positive: Chest pain, shortness of breath Negative: Cough, fever  Physical Exam  BP (!) 143/105 (BP Location: Right Arm)   Pulse 78   Temp 98.4 F (36.9 C)   Resp 17   LMP 03/23/2024   SpO2 100%  Gen:   Awake, no distress   Resp:  Normal effort  MSK:   Moves extremities without difficulty  Other:  Lungs are clear, no leg swelling noted.   Medical Decision Making  Medically screening exam initiated at 12:49 PM.  Appropriate orders placed.  Corliss Gulla was informed that the remainder of the evaluation will be completed by another provider, this initial triage assessment does not replace that evaluation, and the importance of remaining in the ED until their evaluation is complete.     Huey Scalia, PA-C 03/23/24 1253

## 2024-03-23 NOTE — ED Notes (Signed)
 Called pt for vital recheck twice. No response.

## 2024-05-10 ENCOUNTER — Encounter: Payer: Self-pay | Admitting: *Deleted

## 2024-05-22 ENCOUNTER — Emergency Department (HOSPITAL_BASED_OUTPATIENT_CLINIC_OR_DEPARTMENT_OTHER)

## 2024-05-22 ENCOUNTER — Encounter (HOSPITAL_BASED_OUTPATIENT_CLINIC_OR_DEPARTMENT_OTHER): Payer: Self-pay

## 2024-05-22 ENCOUNTER — Emergency Department (HOSPITAL_BASED_OUTPATIENT_CLINIC_OR_DEPARTMENT_OTHER)
Admission: EM | Admit: 2024-05-22 | Discharge: 2024-05-22 | Disposition: A | Discharge status: Home / Self Care | Attending: Emergency Medicine | Admitting: Emergency Medicine

## 2024-05-22 ENCOUNTER — Other Ambulatory Visit: Payer: Self-pay

## 2024-05-22 DIAGNOSIS — R0789 Other chest pain: Secondary | ICD-10-CM | POA: Insufficient documentation

## 2024-05-22 DIAGNOSIS — R079 Chest pain, unspecified: Secondary | ICD-10-CM

## 2024-05-22 LAB — BASIC METABOLIC PANEL WITH GFR
Anion gap: 12 (ref 5–15)
BUN: 10 mg/dL (ref 6–20)
CO2: 22 mmol/L (ref 22–32)
Calcium: 9.6 mg/dL (ref 8.9–10.3)
Chloride: 105 mmol/L (ref 98–111)
Creatinine, Ser: 0.43 mg/dL — ABNORMAL LOW (ref 0.44–1.00)
GFR, Estimated: >60 mL/min (ref >60)
Glucose, Bld: 103 mg/dL — ABNORMAL HIGH (ref 70–99)
Potassium: 4 mmol/L (ref 3.5–5.1)
Sodium: 139 mmol/L (ref 135–145)

## 2024-05-22 LAB — PREGNANCY, URINE: Preg Test, Ur: NEGATIVE

## 2024-05-22 LAB — CBC
HCT: 35.4 % — ABNORMAL LOW (ref 36.0–46.0)
Hemoglobin: 11.2 g/dL — ABNORMAL LOW (ref 12.0–15.0)
MCH: 24.7 pg — ABNORMAL LOW (ref 26.0–34.0)
MCHC: 31.6 g/dL (ref 30.0–36.0)
MCV: 78 fL — ABNORMAL LOW (ref 80.0–100.0)
Platelets: 353 K/uL (ref 150–400)
RBC: 4.54 MIL/uL (ref 3.87–5.11)
RDW: 14.1 % (ref 11.5–15.5)
WBC: 7.8 K/uL (ref 4.0–10.5)
nRBC: 0 % (ref 0.0–0.2)

## 2024-05-22 LAB — TROPONIN T, HIGH SENSITIVITY: Troponin T High Sensitivity: <15 ng/L (ref 0–19)

## 2024-05-22 NOTE — ED Notes (Signed)
 Reviewed AVS/discharge instructions with patient. Time allotted for and all questions answered. Patient is agreeable for d/c and escorted to ED exit by staff.

## 2024-05-22 NOTE — ED Triage Notes (Signed)
 L chest pain radiating to L shoulder. + SHOB 146/113 at home.

## 2024-05-22 NOTE — Discharge Instructions (Signed)
 Continue to take your medications as prescribed.  Try to avoid things that may make this worse, most commonly these are spicy foods tomato based products fatty foods chocolate and peppermint.  Alcohol and tobacco can also make this worse.  Return to the emergency department for sudden worsening pain fever or inability to eat or drink.

## 2024-05-22 NOTE — ED Provider Notes (Signed)
  [place] EMERGENCY DEPARTMENT AT [facility] Provider Note   CSN: [id number] Arrival date & time: 05/22/24  2019     Patient presents with: Chest Pain   Anna Burns is a 33 y.o. female.   33 yo F with a chief complaints of left-sided chest pain that radiates to the left shoulder.  This has been going on for about a week.  Seems to come and go.  Maybe is more persistent today.  She felt a little bit lightheaded and so ended up talking it over with someone she works with he said she should come to the ED to be evaluated.  She denies exertional symptoms.  Actually feels better when she exercises.  Worsens when she lays down to rest.  Denies cough congestion or fever.  Denies trauma.  Patient denies history of MI, denies hypertension hyperlipidemia diabetes or smoking.  Denies family history of MI.  Patient denies history of PE or DVT denies hemoptysis denies unilateral lower extremity edema denies recent surgery immobilization hospitalization estrogen use or history of cancer.     Chest Pain      Prior to Admission medications   Medication Sig Start Date End Date Taking? Authorizing Provider  acetaminophen  (TYLENOL ) 500 MG tablet Take 2 tablets (1,000 mg total) by mouth every 6 (six) hours as needed for mild pain (pain score 1-3) or moderate pain (pain score 4-6). 05/16/23   Henry Slough, MD  ALPRAZolam (XANAX) 0.25 MG tablet Take 0.25 mg by mouth 2 (two) times daily. 05/12/23   [provider]  ibuprofen  (ADVIL ) 600 MG tablet Take 1 tablet (600 mg total) by mouth every 6 (six) hours as needed for mild pain (pain score 1-3), moderate pain (pain score 4-6) or cramping. 05/16/23   Henry Slough, MD  methylPREDNISolone  (MEDROL  DOSEPAK) 4 MG TBPK tablet 6 tabs day 1, 5 tabs day 2, 4 tabs day 3, 3 tabs day 4, 2 tabs day 5, 1 tabs day 6 Patient not taking: Reported on 05/15/2023 09/28/21   Camara, Amadou, MD  oxyCODONE  (ROXICODONE ) 5 MG immediate release tablet  Take 1 tablet (5 mg total) by mouth every 6 (six) hours as needed for severe pain (pain score 7-10) or breakthrough pain. 05/16/23   Henry Slough, MD  SUMAtriptan  (IMITREX ) 50 MG tablet Take 1 tablet (50 mg total) by mouth every 2 (two) hours as needed for migraine. May repeat in 2 hours if headache persists or recurs. Patient not taking: Reported on 05/15/2023 09/28/21   Camara, Amadou, MD  topiramate  (TOPAMAX ) 25 MG tablet Take 1 tablet (25 mg total) by mouth 2 (two) times daily. Patient not taking: Reported on 05/15/2023 09/28/21   Camara, Amadou, MD    Allergies: Contrast media [iodinated contrast media]    Review of Systems  Cardiovascular:  Positive for chest pain.    Updated Vital Signs BP (!) 144/98   Pulse 75   Temp 97.8 F (36.6 C) (Temporal)   Resp 17   Ht 5' 3.5 (1.613 m)   Wt 81.6 kg   LMP 05/21/2024 (Exact Date)   SpO2 96%   BMI 31.39 kg/m   Physical Exam Vitals and nursing note reviewed.  Constitutional:      General: She is not in acute distress.    Appearance: She is well-developed. She is not diaphoretic.  HENT:     Head: Normocephalic and atraumatic.  Eyes:     Pupils: Pupils are equal, round, and reactive to light.  Cardiovascular:  Rate and Rhythm: Normal rate and regular rhythm.     Heart sounds: No murmur heard.    No friction rub. No gallop.  Pulmonary:     Effort: Pulmonary effort is normal.     Breath sounds: No wheezing or rales.  Abdominal:     General: There is no distension.     Palpations: Abdomen is soft.     Tenderness: There is no abdominal tenderness.  Musculoskeletal:        General: No tenderness.     Cervical back: Normal range of motion and neck supple.  Skin:    General: Skin is warm and dry.  Neurological:     Mental Status: She is alert and oriented to person, place, and time.  Psychiatric:        Behavior: Behavior normal.     (all labs ordered are listed, but only abnormal results are displayed) Labs Reviewed   BASIC METABOLIC PANEL WITH GFR - Abnormal; Notable for the following components:      Result Value   Glucose, Bld 103 (*)    Creatinine, Ser 0.43 (*)    All other components within normal limits  CBC - Abnormal; Notable for the following components:   Hemoglobin 11.2 (*)    HCT 35.4 (*)    MCV 78.0 (*)    MCH 24.7 (*)    All other components within normal limits  PREGNANCY, URINE  TROPONIN T, HIGH SENSITIVITY    EKG: None  Radiology: [facility] Chest [facility] 1 View Result Date: 05/22/2024 EXAM: 1 VIEW(S) XRAY OF THE CHEST 05/22/2024 09:44:00 PM COMPARISON: None available. CLINICAL HISTORY: Chest pain FINDINGS: LUNGS AND PLEURA: No focal pulmonary opacity. No pulmonary edema. No pleural effusion. No pneumothorax. HEART AND MEDIASTINUM: No acute abnormality of the cardiac and mediastinal silhouettes. BONES AND SOFT TISSUES: No acute osseous abnormality. IMPRESSION: 1. No acute cardiopulmonary process. Electronically signed by: Pinkie Pebbles MD 05/22/2024 10:01 PM EST RP Workstation: [id number]     Procedures   Medications Ordered in the ED - No data to display                                  Medical Decision Making Amount and/or Complexity of Data Reviewed Labs: ordered. Radiology: ordered.   42 yoF with a chief complaint of chest pain.  This has been going on for about a week.  Not exertional.  She has a troponin that is negative.  She is PERC negative.  Chest x-ray independently interpreted by me without focal infiltrate or pneumothorax.  No anemia.  No acute electrolyte abnormalities.  Will treat as possible reflux.  PCP follow-up.  10:33 PM:  I have discussed the diagnosis/risks/treatment options with the patient.  Evaluation and diagnostic testing in the emergency department does not suggest an emergent condition requiring admission or immediate intervention beyond what has been performed at this time.  They will follow up with PCP. We also discussed returning to the ED  immediately if new or worsening sx occur. We discussed the sx which are most concerning (e.g., sudden worsening pain, fever, inability to tolerate by mouth) that necessitate immediate return. Medications administered to the patient during their visit and any new prescriptions provided to the patient are listed below.  Medications given during this visit Medications - No data to display   The patient appears reasonably screen and/or stabilized for discharge and I doubt any other medical condition  or other [facility] requiring further screening, evaluation, or treatment in the ED at this time prior to discharge.       Final diagnoses:  Nonspecific chest pain    ED Discharge Orders     None          Emil Share, DO 05/22/24 2233

## 2024-07-07 NOTE — Progress Notes (Signed)
 Patient ID:  Anna Burns is a 33 y.o. (DOB 08-30-1990) female.  Assessment and Plan   1. Other chest pain   2. Nausea   3. GAD (generalized anxiety disorder)     Assessment & Plan Chronic nausea: - Persistent nausea worsens with yawning. No stomach pain or specific triggers. - Labs normal.  We did trial PPI therapy for a few weeks to see if this yielded any symptom improvement, she did not have any improvement with use of omeprazole. - Refer to gastroenterologist for further evaluation, consider upper endoscopy.  Chest pain: - Chest pain, sometimes radiating to shoulder. No jaw pain. - Normal EKG. - Refer to cardiologist for further evaluation, consider Holter monitor or echocardiogram.  Anxiety: - Suspect anxiety could be contributing to nausea and chest pain. Panic attacks and difficulty breathing. - Continue therapy, consider psychiatrist if symptoms persist. - Prescribe sertraline 25 mg, increase to 50 mg if no improvement in 1-2 weeks. Prescribe hydroxyzine  10 mg for breakthrough anxiety.  Follow-up: In 8 weeks, or sooner if necessary.   Results Imaging  - Ultrasound: Normal  Diagnostic Testing  - EKG: 03/2024, Normal   Discussed close monitoring for any new or worsening symptoms. Discussed signs and symptoms that would warrant immediate follow-up.  Patient understands and agrees with plan. Computer technology was used to create visit note. Consent from the patient/caregiver was obtained prior to use.  Follow up in about 8 weeks (around 09/01/2024) for Medication management.    Patient's Medications       * Accurate as of July 07, 2024  9:34 AM. Reflects encounter med changes as of last refresh          New Prescriptions      Instructions  hydrOXYzine  HCl 10 mg tablet Commonly known as: ATARAX  Started by: Courtney McKillop, FNP  10 mg, Oral, 3 times a day as needed   sertraline 25 mg tablet Commonly known as: ZOLOFT Started by: Charmaine Ates, FNP  25 mg, Oral, Daily       Continued Medications      Instructions  albuterol  sulfate HFA 108 (90 Base) MCG/ACT inhaler Commonly known as: PROAIR  HFA  2 puffs, Inhalation, Every 6 hours as needed, Inhale two puffs every four to six hours as needed.   ondansetron  8 mg tablet Commonly known as: ZOFRAN   8 mg, Oral, Every 8 hours as needed       Discontinued Medications    pantoprazole  sodium 20 mg tablet Commonly known as: PROTONIX  Stopped by: Charmaine Ates, FNP   phentermine 37.5 MG tablet Commonly known as: ADIPEX-P Stopped by: Charmaine Ates, FNP        Orders Placed This Encounter  Procedures   Ambulatory referral to Cardiology   Ambulatory referral to Gastroenterology    Risks, benefits, and alternatives of the medications and treatment plan prescribed today were discussed, and patient expressed understanding. Plan follow-up as discussed or as needed if any worsening symptoms or change in condition.    A yearly preventative health exam was recommended and current age based recommendations were discussed.   Subjective   Patient ID:  Anna Burns is a 33 y.o. (DOB [birth year]) female    Patient presents with   Follow-up     HPI:   History of Present Illness The patient is a 33 year old female presenting with chronic nausea, chest pain, and anxiety.  She reports persistent nausea, which is exacerbated during yawning and originates in her throat. She does not experience  any abdominal pain or bowel changes. Laboratory tests and an ultrasound were normal. Medications such as Zofran  and Protonix  have been ineffective, and she is not pregnant.  The patient has been experiencing chest pain for several months, which occasionally radiates to her shoulder. She does not have any jaw pain and suspects that the chest pain may be correlated with fluctuations in her blood pressure.  Recent EKG normal.  She has a history of anxiety and has tried  Xanax, which she found too potent. She is considering hydroxyzine  and does not take any daily anxiety medications. Recently, she had a panic attack that involved crying and difficulty breathing. She is seeing a therapist and is awaiting an insurance change to be referred to a psychiatrist. She reports poor sleep quality.  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 She reports poor sleep quality.  FAMILY HISTORY Mother has pancreatic cancer.    Past Medical History, Past Surgery History, Allergies, Social History, and Family History were reviewed and updated.    Review of Systems  Constitutional:  Positive for chills. Negative for fever.  Respiratory:  Negative for shortness of breath.   Cardiovascular:  Positive for chest pain. Negative for palpitations.  Gastrointestinal:  Positive for nausea. Negative for vomiting.     Objective   Vitals:   07/07/24 0853  BP: 122/86  Patient Position: Sitting  Pulse: 75  Resp: 20  Height: 5' 4 (1.626 m)  Weight: 188 lb (85.3 kg)  SpO2: 98%  BMI (Calculated): 32.3  PainSc: 0-No pain      Physical Exam Constitutional:      General: She is not in acute distress. Eyes:     Pupils: Pupils are equal, round, and reactive to light.  Cardiovascular:     Rate and Rhythm: Normal rate and regular rhythm.     Heart sounds: Normal heart sounds.  Pulmonary:     Effort: Pulmonary effort is normal.     Breath sounds: Normal breath sounds.  Neurological:     Mental Status: She is alert and oriented to person, place, and time.  Psychiatric:        Behavior: Behavior normal.       Voice recognition software was used and creation of this note. Despite my best effort at editing the text, some misspelling/errors may have occurred. *Some images could not be shown.

## 2024-07-09 NOTE — Progress Notes (Signed)
 [facility] Telephonic Visit NON VIDEO VISIT   Patient ID:  Anna Burns is a 33 y.o. (DOB [birth year]) female  Patient has been advised as to the limitations and limited nature of physical exam due to nature of a telephone visit, the possibility of privacy risk in the use of a telephone visit, and that the healthcare provider may recommend visiting a healthcare clinic for in-person care and follow up.  Telephonic Visit Assessment and Plan   1. Pelvic pain in female (Primary) 2. History of pelvic ultrasound    - Discussed pelvic ultrasound in detail. - NuSwab Negative.  - Patient verbalized understanding with no further questions or concerns.   Risk, benefits, and alternatives were provided through patient instructions given to the patient during the telephone interaction.  If any worsening symptoms or lack of improvement, the patient will seek immediate medical care.  Telephonic Visit History      Patient presents with   Follow-up     Pelvic ultrasound results.    Reviewed and updated this visit by provider: None        ROS:  As documented in the history above, all other relevant system complaints were negative.  Telephonic Visit Objective Findings   This is a telephone visit. Examination conducted without the use of video cameras/computer monitors. Vital signs and other aspects of physical exam are limited due to the nature of this encounter.   Constitutional:  No apparent acute distress noted during the telephone interaction; Alert and verbally interactive. Mood:  Appears appropriate to situation.  Transvaginal Ultrasound Performed:  Impression Uterus: 9.50 x 6.37 x 5.52 cm. anteverted Endometrial stripe: 5.8 mm Fibroids: none seen Endometrial mass: none Endometrium symmetrical Right ovary: normal, no masses, 19 x 15 mm resolving corpus luteum, cycle day 17 Left ovary: surgically absent No adnexal masses No free fluid  I attest that I have spent more  than 10 minutes in medical discussion with the patient.

## 2024-07-21 NOTE — Progress Notes (Addendum)
 " Assessment and Care Plan   1. Stress (Primary)  Reports increased stress recently due to mother's health concerns, being back in school, having sweets cravings, not sleeping well. She reports issues with stress eating and does not have a lot of stress relievers in place although she is very consistent with exercise. She did meet once with [facility] but has recently restarted with her previous therapist.   Work on relaxation techniques to help with stressful situations and proper sleep hygiene, which can include deep breathing exercises, stretching, yoga, meditation. Encourage to work with [facility] specialist or therapist as needed. Encourage to exercise regularly.   2. Migraine with aura and without status migrainosus, not intractable  History of migraine headaches. She reports she has been experiencing them daily recently. She believes they may be exacerbated by phentermine use.    Discontinue phentermine. Prescribe topiramate  25mg  daily for dual purpose of headache relief/control and weight loss. Aim for adequate sleep. Exercise often. Practice ways to lower your stress. Learn and practice ways to cope. Identify and record triggers about your headaches, like when they happen, what causes them, and what helps them.   - topiramate  (TOPAMAX ) 25 MG tablet; Take one tablet (25mg ) by mouth every evening.  Dispense: 30 tablet; Refill: 0  3. Class 1 obesity without serious comorbidity with body mass index (BMI) of 32.0 to 32.9 in adult, unspecified obesity type  Slight weight gain since her last appointment. She was taking phentermine but discontinued this due to daily headaches. She reports stress eating and cravings for sweets. She was taking phentermine but discontinued this due to feeling jittery. She was taking Wegovy previously but had to discontinue when it was no longer covered last year. She would like to restart it.   Discontinue phentermine. Prescribe Tzhncb 0.25mg  weekly injection for better  appetite and craving control. Prescribe topiramate  25mg  today to help with headache relief/control and weight loss. Continue to work on dietary changes; focus on protein and fiber to help with hunger. Get back to gym for strength training 3 days per week and walking 10k steps daily.  - WEGOVY 0.25 MG/0.5ML SOAJ injection; Inject 0.5 mLs (0.25 mg dose) into the skin once a week at 0900 for 4 doses.  Dispense: 2 mL; Refill: 0   Visit Goals: Drink 64 ounces water daily  Gym for strength training 3 days per week; walk 10k steps daily   Short Term 5% Goal:   5% goal 9.8 lbs (met 01/14/24) from restart 10% goal: 19.7lbs Long Term Goal:   150 lbs  Weight loss Medication(s):  Wegovy 07/29/24  Topiramate  07/21/24 Phentermine d/c due to feeling jittery and having daily headaches  Has taken phentermine - good results Wegovy - no longer covered   [employer] Weight History: 1.    181.2 lbs 02/12/22 2.    179.0 lbs 02/28/22 3.    178.2 lbs 04/08/22  4.    177.8 lbs 04/23/22 5.    178.6 lbs 05/14/22  6.    193.4 lbs 01/30/23 7.    194.2 lbs 02/25/23 8.    196.5 lbs 10/16/23 RESTART 9.    11/06/23 SMA  10.  190.4 lbs 12/15/23  11.  184.0 lbs 01/14/24 VV 12.  184.8 lbs 04/21/24 13.  187.2 lbs 05/19/24  14.  189.6 lbs 07/21/24   Amount of weight lost since the last appointment: +2.4 lbs   Total weight lost: -6.9 lbs since restart  Care Plan:   NP: monthly  RD:  MNT WLS: not a candidate MC: completed  Risks, benefits, and alternatives of the medications and treatment plan prescribed today were discussed, and patient expressed understanding.   Follow up in about 1 month (around 08/21/2024) for Routine Follow Up, Weight Check In, Review previous week's goals. or as needed if any worsening symptoms or change in condition.    Subjective   Chief Complaint  Patient presents with   Weight Management   HPI:  Anna Burns is a 34 y.o. (DOB 1990-11-25) female here for follow-up care as part of plan for obesity management  and related co-morbidities.  Anna Burns is a 34 y.o.female who presents to the office for weight management follow up and with increased stress. She reports her increased stress is related to mother's health concerns, being back in school, having sweets cravings, not sleeping well. She reports issues with stress eating and does not have a lot of stress relievers in place although she is very consistent with exercise. She did meet once with [facility] but has recently restarted with her previous therapist.   Anna Burns has a history of migraine headaches. She reports she has been experiencing them daily recently. She believes they may be exacerbated by phentermine use. Discontinue phentermine. Prescribe topiramate  25mg  daily for dual purpose of headache relief/control and weight loss.   [name] has been participating with [employer] since August 2023 and restarted in April 2025. She has a cumulative weight loss of 6.9 pounds since her restart. She was taking phentermine but discontinued due to daily headaches and feeling jittery. She was taking Wegovy but discontinued due to coverage. She is prescribed Wegovy to help with appetite and craving control. She is prescribed topiramate  today for dual purpose of headache relief/control and weight loss. Continue to work on dietary changes; focus on protein and fiber to help with hunger. Get back to gym for strength training 3 days per week and walking 10k steps daily.  Barriers/Stressors: back in school; works full time; mom has pancreatic cancer - questionable surgery; has a sweet tooth; not always making the best food choices/ only eating one meal per day; not sleeping well Successes: sleeping better; getting at least 10k steps per day; consistent exercise; not hungry in the morning; eats a late breakfast/early lunch  Current exercise: [employer] 3 times per week for strength training; getting 10k steps per day  Sleep quantity and quality: not good; sleeping 2 hours on  average per night (may be r/t stress)  Water consumption: at least 64 ounces water daily   Past Medical History, Past Surgical History, Past Family History, Social History, Medications, and Allergies were reviewed and updated as appropriate.  Objective   BP 124/86   Pulse 84   Ht 5' 4 (1.626 m)   Wt 189 lb 9.6 oz (86 kg)   LMP 06/17/2024   SpO2 98%   BMI 32.54 kg/m   Physical Exam Constitutional:      Appearance: Normal appearance. She is obese.  Pulmonary:     Effort: Pulmonary effort is normal.  Neurological:     Mental Status: She is alert and oriented to person, place, and time.  Psychiatric:        Behavior: Behavior normal.         "

## 2024-07-21 NOTE — Progress Notes (Signed)
-   AVS printed and given to patient.  - Endoscopy prep given and reviewed with patient.  Medications to be adjusted / stopped prior: None.

## 2024-07-22 NOTE — Progress Notes (Signed)
 " Primary Care Physician: Lyle DELENA Setters, NP  Reason for visit: Chest pain, dyspnea, lightheadedness  HPI:  This is pleasant 34 y.o. yrs old female with elevated blood pressure readings in last few weeks, anxiety neurosis, obesity with BMI of 33.3 is here for cardiac evaluation.  She has been having intermittent midsternal chest discomfort, happening randomly, no radiation, mild to moderate in intensity, not associated with exertion, each episode lasting for few seconds to minutes.  Lately, symptoms have been recurring almost on daily basis and hence she is concerned.  She has noticed her blood pressure ranging in the 150s to 160 systolically over the last few weeks.  She is not taking any antihypertensive.  She was also noted to have mild hyperlipidemia with most recent LDL of 129 with total cholesterol 195.  She is not on a statin. She works as a financial risk analyst. There is no family history of sudden cardiac death or premature coronary artery disease.  No family history of premature hypertension. No history of diabetes, she does not smoke. In addition to intermittent chest pain, she also feels short of breath on moderate level of exertion, has noted trace lower extremity edema especially in the evening, better in the morning.   Patient denies history of MI, denies hypertension hyperlipidemia diabetes or smoking.  Denies family history of MI.  EKG with sinus rhythm, no ischemia, no ectopics.  Allergies: Ivp [iodinated diagnostic agents]  Past Medical History: Past Medical History:  Diagnosis Date   Anxiety    Asthma (*)    Depression    Hyperlipidemia    Migraines    PTSD (post-traumatic stress disorder)    Past Surgical History:  Procedure Laterality Date   Dilation and curettage of uterus     x2 missed abortions   Left elbow surgery     Ovary surgery Left    Removal   Family History: Family History[1] Social History: Social History[2]  Medication  list:     Medication Sig Dispense Refill   amLODIPine besylate (NORVASC) 5 mg tablet Take one tablet (5 mg dose) by mouth daily. 30 tablet 5   omeprazole (PRILOSEC) 40 mg capsule Take one capsule (40 mg dose) by mouth daily. 30 capsule 5   ondansetron  (ZOFRAN ) 8 mg tablet Take one tablet (8 mg dose) by mouth every 8 (eight) hours as needed for Nausea. 12 tablet 1   sertraline (ZOLOFT) 25 mg tablet Take one tablet (25 mg dose) by mouth daily. 30 tablet 2   topiramate  (TOPAMAX ) 25 MG tablet Take one tablet (25mg ) by mouth every evening. 30 tablet 0   No current facility-administered medications for this visit.    ROS:The patient denies nausea, vomiting, diarrhea, fever, chills, or bleeding. All other systems are reviewed and are negative except for that mentioned in the history of present illness.  LABS:   Lab Results  Component Value Date   Cholesterol, Total 195 06/17/2022   Cholesterol, Total 190 04/23/2022   Cholesterol, Total 225 (H) 04/04/2021   Lab Results  Component Value Date   HDL 44 06/17/2022   HDL 45 04/23/2022   HDL 45 04/04/2021   Lab Results  Component Value Date   LDL 129 (H) 06/17/2022   LDL 130 (H) 04/23/2022   LDL 147 (H) 04/04/2021   Lab Results  Component Value Date   Triglycerides 122 06/17/2022   Triglycerides 82 04/23/2022   Triglycerides 180 (H) 04/04/2021   No results found for: CHOLHDL  Lab  Results  Component Value Date   WBC 7.8 03/24/2024   Hemoglobin 11.6 03/24/2024   Hematocrit 37.7 03/24/2024   MCV 80 03/24/2024   Platelet Count 352 03/24/2024    Lab Results  Component Value Date   Creatinine 0.51 (L) 03/24/2024   BUN 7 03/24/2024   Sodium 138 03/24/2024   Potassium 4.2 03/24/2024   Chloride 104 03/24/2024   CO2 20 03/24/2024   Lab Results  Component Value Date   TSH 0.653 08/06/2023    Results for orders placed or performed in visit on 08/06/23  Hemoglobin A1c  Result Value Ref Range   Hemoglobin A1c 5.9 (H) 4.8  - 5.6 %   Narrative   Performed at:  [address] [facility] [address] [place], [place]  [id number] Lab Director: Frankey Sas MD, Phone:  [phone]  Results for orders placed or performed in visit on 04/23/22  Hemoglobin A1c  Result Value Ref Range   Hemoglobin A1c 5.7 (H) 4.8 - 5.6 %   Narrative   Performed at:  [address] [facility]  [address] [place], [place]  [id number] Lab Director: Frankey Sas MD, Phone:  [phone]     Recent Results (from the past 24 hours)  ECG 12 lead   Collection Time: 07/22/24  3:21 PM  Result Value Ref Range   Acquisition Device MAC7    Ventricular Rate 68 BPM   Atrial Rate 68 BPM   P-R Interval 124 ms   QRS Duration 86 ms   Q-T Interval 384 ms   QTC Calculation(Bazett) 408 ms   Calculated P Axis 59 degrees   Calculated R Axis 79 degrees   Calculated T Axis 18 degrees   ECG Diagnosis      Normal sinus rhythm Normal ECG When compared with ECG of 10-Nov-2017 21:56, No significant change was found    Physical Exam:   Vital Signs: BP (!) 158/115 (BP Location: Left Upper Arm, Patient Position: Standing)   Pulse 87   Ht 5' 4 (1.626 m)   Wt 194 lb 6.4 oz (88.2 kg)   LMP 07/20/2024 (Exact Date)   SpO2 99%   Breastfeeding No   BMI 33.37 kg/m  Constitutional: Well-nourished well-developed ,  no acute distress.  HENT:normocephalic,atraumatic,oropharynx moist,no oral exudates.   Neck: Supple. No JVD. Carotid bruits: Absent bilaterally. Cardiovascular: RRR. No murmur, No gallop. No rub. Respiratory: Mildly decreased breath sounds bilaterally.  No wheezes or crackles noted GI: Soft, nontender, nondistended, with normal bowel sounds, no masses or hepatosplenomegaly.  Skin: Warm, dry, no erythema, no rash. Musculoskeletal: No edema, no tenderness, no cyanosis, no clubbing. Pulses: 1+ and intact bilaterally  IMPRESSION AND PLAN    1. Other chest pain   2. Lightheaded   3. Dizziness   4. Hypertension, unspecified type   5.  Dyspnea, unspecified type   6. Obesity (BMI 30-39.9)    Plan:  She does have nonspecific symptoms of chest pain syndrome, dyspnea on exertion, headache, lightheadedness/dizziness.  EKG is benign.  Her symptoms are not exertional.  There is no family history of sudden cardiac death or premature coronary artery disease.  I will start on amlodipine 5 mg daily for hypertension.  She will continue to keep a log of blood pressure at home and report to me in 2 weeks. She does have mild hyperlipidemia.  Might need medication if cholesterol level does not decrease in 3 to 6 months after lifestyle management. Request monitor for 2 weeks for ruling out arrhythmia. Request echocardiogram  for structural cardiac evaluation/evaluation of hypertension cardiomyopathy.  Clinically, she is not in heart failure.  I will consider treadmill stress test prior to prescribing exercise regimen.  We had discussion about importance of life style modification, keeping BP, blood sugar and cholesterol, body weight at goal, role of regular exercise (at least 30 minutes of moderate intensity aerobic exercise 5 days in a week), following low fat, low carb, low salt heart healthy diet, abstinence from smoking and drinking etc.  We also discussed about visiting PCP visit for non cardiac issues, importance of regular follow up, health screening  and medication compliance.   Follow up in about 4 months (around 11/19/2024).   Marcello MARLA Lennox, MD   Note: This documented was generated using voice recognition software. There may be unintended transcription errors that were not detected upon document review.            [1] Family History Problem Relation Name Age of Onset   Alcohol abuse Brother      Maternal Grandfather     Anuerysm Mother          brain   Anxiety disorder Mother      Sister     Asthma Paternal Grandmother      Son     Colon cancer Neg Hx     Colon polyps Neg Hx     Dementia Paternal Grandmother      Depression Mother      Sister     Diabetes Father      Mother     Drug abuse Brother     Eczema Son     Heart attack Maternal Grandmother     Heart disease Mother     Lupus Paternal Grandmother     Migraines Father     No Known Problems Brother      Brother      Daughter      Paternal Grandfather      Sister     Obesity Sister     Pancreatic cancer Mother  17   Post-traumatic stress disorder Sister     Tourette syndrome Daughter    [2] Social History Socioeconomic History   Marital status: Married  Occupational History   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

## 2024-08-04 ENCOUNTER — Emergency Department (HOSPITAL_BASED_OUTPATIENT_CLINIC_OR_DEPARTMENT_OTHER)
Admission: EM | Admit: 2024-08-04 | Discharge: 2024-08-05 | Disposition: A | Discharge status: Home / Self Care | Source: Ambulatory Visit | Attending: Emergency Medicine | Admitting: Emergency Medicine

## 2024-08-04 ENCOUNTER — Emergency Department (HOSPITAL_BASED_OUTPATIENT_CLINIC_OR_DEPARTMENT_OTHER): Admitting: Radiology

## 2024-08-04 ENCOUNTER — Encounter (HOSPITAL_BASED_OUTPATIENT_CLINIC_OR_DEPARTMENT_OTHER): Payer: Self-pay | Admitting: Emergency Medicine

## 2024-08-04 ENCOUNTER — Other Ambulatory Visit: Payer: Self-pay

## 2024-08-04 DIAGNOSIS — R079 Chest pain, unspecified: Secondary | ICD-10-CM | POA: Insufficient documentation

## 2024-08-04 DIAGNOSIS — G43809 Other migraine, not intractable, without status migrainosus: Secondary | ICD-10-CM | POA: Insufficient documentation

## 2024-08-04 DIAGNOSIS — I10 Essential (primary) hypertension: Secondary | ICD-10-CM | POA: Insufficient documentation

## 2024-08-04 DIAGNOSIS — R519 Headache, unspecified: Secondary | ICD-10-CM | POA: Diagnosis present

## 2024-08-04 LAB — CBC
HCT: 33.8 % — ABNORMAL LOW (ref 36.0–46.0)
Hemoglobin: 10.8 g/dL — ABNORMAL LOW (ref 12.0–15.0)
MCH: 24.4 pg — ABNORMAL LOW (ref 26.0–34.0)
MCHC: 32 g/dL (ref 30.0–36.0)
MCV: 76.3 fL — ABNORMAL LOW (ref 80.0–100.0)
Platelets: 316 K/uL (ref 150–400)
RBC: 4.43 MIL/uL (ref 3.87–5.11)
RDW: 14 % (ref 11.5–15.5)
WBC: 7.9 K/uL (ref 4.0–10.5)
nRBC: 0 % (ref 0.0–0.2)

## 2024-08-04 LAB — PREGNANCY, URINE: Preg Test, Ur: NEGATIVE

## 2024-08-04 LAB — BASIC METABOLIC PANEL WITH GFR
Anion gap: 12 (ref 5–15)
BUN: 8 mg/dL (ref 6–20)
CO2: 22 mmol/L (ref 22–32)
Calcium: 9.4 mg/dL (ref 8.9–10.3)
Chloride: 104 mmol/L (ref 98–111)
Creatinine, Ser: 0.4 mg/dL — ABNORMAL LOW (ref 0.44–1.00)
GFR, Estimated: >60 mL/min
Glucose, Bld: 115 mg/dL — ABNORMAL HIGH (ref 70–99)
Potassium: 3.8 mmol/L (ref 3.5–5.1)
Sodium: 137 mmol/L (ref 135–145)

## 2024-08-04 LAB — TROPONIN T, HIGH SENSITIVITY: Troponin T High Sensitivity: <6 ng/L (ref 0–19)

## 2024-08-04 MED ORDER — SODIUM CHLORIDE 0.9 % IV BOLUS
500.0000 mL | Freq: Once | INTRAVENOUS | Status: AC
  Administered 2024-08-04: 500 mL via INTRAVENOUS

## 2024-08-04 MED ORDER — KETOROLAC TROMETHAMINE 30 MG/ML IJ SOLN
15.0000 mg | Freq: Once | INTRAMUSCULAR | Status: AC
  Administered 2024-08-04: 15 mg via INTRAVENOUS
  Filled 2024-08-04: qty 1

## 2024-08-04 MED ORDER — ONDANSETRON HCL 4 MG/2ML IJ SOLN
4.0000 mg | Freq: Once | INTRAMUSCULAR | Status: AC
  Administered 2024-08-04: 4 mg via INTRAVENOUS

## 2024-08-04 MED ORDER — METOCLOPRAMIDE HCL 5 MG/ML IJ SOLN
10.0000 mg | Freq: Once | INTRAMUSCULAR | Status: AC
  Administered 2024-08-04: 10 mg via INTRAVENOUS
  Filled 2024-08-04: qty 2

## 2024-08-04 NOTE — ED Provider Notes (Signed)
 " [place] EMERGENCY DEPARTMENT AT [facility] Provider Note   CSN: [id number] Arrival date & time: 08/04/24  2150     Patient presents with: Chest Pain   Anna Burns is a 34 y.o. female.  With a history of hypertension and migraine headaches who presents to the ED for headache.  Patient has an upcoming appointment with her cardiologist scheduled for on January 26 with a plan for echo and stress test given recent chest pain.  Tonight while driving home from work around 1800 she began to develop severe headache chest pain jaw pain which made her uncomfortable and anxious.  Headache is made worse with bright lights and there is associated nausea as well.  No vomiting fevers chills shortness of breath.    Chest Pain      Prior to Admission medications  Medication Sig Start Date End Date Taking? Authorizing Provider  acetaminophen  (TYLENOL ) 500 MG tablet Take 2 tablets (1,000 mg total) by mouth every 6 (six) hours as needed for mild pain (pain score 1-3) or moderate pain (pain score 4-6). 05/16/23   Henry Slough, MD  ALPRAZolam (XANAX) 0.25 MG tablet Take 0.25 mg by mouth 2 (two) times daily. 05/12/23   [provider]  ibuprofen  (ADVIL ) 600 MG tablet Take 1 tablet (600 mg total) by mouth every 6 (six) hours as needed for mild pain (pain score 1-3), moderate pain (pain score 4-6) or cramping. 05/16/23   Henry Slough, MD  methylPREDNISolone  (MEDROL  DOSEPAK) 4 MG TBPK tablet 6 tabs day 1, 5 tabs day 2, 4 tabs day 3, 3 tabs day 4, 2 tabs day 5, 1 tabs day 6 Patient not taking: Reported on 05/15/2023 09/28/21   Camara, Amadou, MD  oxyCODONE  (ROXICODONE ) 5 MG immediate release tablet Take 1 tablet (5 mg total) by mouth every 6 (six) hours as needed for severe pain (pain score 7-10) or breakthrough pain. 05/16/23   Henry Slough, MD  SUMAtriptan  (IMITREX ) 50 MG tablet Take 1 tablet (50 mg total) by mouth every 2 (two) hours as needed for migraine. May repeat in 2 hours if  headache persists or recurs. Patient not taking: Reported on 05/15/2023 09/28/21   Camara, Amadou, MD  topiramate  (TOPAMAX ) 25 MG tablet Take 1 tablet (25 mg total) by mouth 2 (two) times daily. Patient not taking: Reported on 05/15/2023 09/28/21   Camara, Amadou, MD    Allergies: Contrast media [iodinated contrast media]    Review of Systems  Cardiovascular:  Positive for chest pain.    Updated Vital Signs BP (!) 161/104 (BP Location: Right Arm)   Pulse 78   Temp 98.1 F (36.7 C)   Resp 18   Ht 5' 3 (1.6 m)   Wt 81 kg   SpO2 100%   BMI 31.63 kg/m   Physical Exam Vitals and nursing note reviewed.  HENT:     Head: Normocephalic and atraumatic.  Eyes:     Extraocular Movements: Extraocular movements intact.     Pupils: Pupils are equal, round, and reactive to light.  Cardiovascular:     Rate and Rhythm: Normal rate and regular rhythm.  Pulmonary:     Effort: Pulmonary effort is normal.     Breath sounds: Normal breath sounds.  Abdominal:     Palpations: Abdomen is soft.     Tenderness: There is no abdominal tenderness.  Skin:    General: Skin is warm and dry.  Neurological:     Mental Status: She is alert.  Psychiatric:  Mood and Affect: Mood normal.     (all labs ordered are listed, but only abnormal results are displayed) Labs Reviewed  BASIC METABOLIC PANEL WITH GFR - Abnormal; Notable for the following components:      Result Value   Glucose, Bld 115 (*)    Creatinine, Ser 0.40 (*)    All other components within normal limits  CBC - Abnormal; Notable for the following components:   Hemoglobin 10.8 (*)    HCT 33.8 (*)    MCV 76.3 (*)    MCH 24.4 (*)    All other components within normal limits  PREGNANCY, URINE  TROPONIN T, HIGH SENSITIVITY    EKG: EKG Interpretation Date/Time:  Wednesday August 04 2024 21:56:16 EST Ventricular Rate:  77 PR Interval:  146 QRS Duration:  86 QT Interval:  390 QTC Calculation: 441 R Axis:   43  Text  Interpretation: Normal sinus rhythm Normal ECG When compared with ECG of 22-May-2024 20:31, No significant change was found Confirmed by Pamella Sharper [id number] on 08/04/2024 10:58:21 PM  Radiology: No results found.   Procedures   Medications Ordered in the ED  ketorolac  (TORADOL ) 30 MG/ML injection 15 mg (15 mg Intravenous Given 08/04/24 2247)  metoCLOPramide  (REGLAN ) injection 10 mg (10 mg Intravenous Given 08/04/24 2248)  ondansetron  (ZOFRAN ) injection 4 mg (4 mg Intravenous Given 08/04/24 2247)  sodium chloride  0.9 % bolus 500 mL (500 mLs Intravenous New Bag/Given 08/04/24 2308)    Clinical Course as of 08/04/24 2312  Wed Aug 04, 2024  2312 EKG without dysrhythmia or ischemic changes.  Laboratory workup unremarkable thus far.  Awaiting chest x-ray, urine pregnancy and delta troponin.  Chest pain headache or improved since medications here with IV fluids underway as well.  LILLETTE Sharper Pamella DO, am transitioning care of this patient to the oncoming provider pending urine pregnancy, chest x-ray, delta troponin, reevaluation and disposition [MP]    Clinical Course User Index [MP] Pamella Sharper LABOR, DO                                 Medical Decision Making 34 year old female with history as above presented to ED for chest pain and headache.  Recent chest pain for which she is being evaluated by cardiology but chest pain recurred today along with headache.  Associated photophobia and nausea.  Anxious on my exam.  Benign neurologic exam without deficits.  Will obtain laboratory workup and EKG to evaluate for underlying causes such as electrolyte imbalance anemia ACS dysrhythmia.  Will provide her with migraine cocktail and reassess  Amount and/or Complexity of Data Reviewed Labs: ordered. Radiology: ordered.  Risk Prescription drug management.        Final diagnoses:  Other migraine without status migrainosus, not intractable  Chest pain, unspecified type    ED Discharge Orders      None          Pamella Sharper LABOR, DO 08/04/24 2312  "

## 2024-08-04 NOTE — ED Triage Notes (Signed)
 Reports on her way home from work at 6 pm tonight she started to have a headache, chest pain, jaw pain, started feeling nervous, took her blood pressure and it was 160/119. Reports she is having trouble getting a deep breath which started yesterday.   Chest pain is right in the middle, left sided jaw pain, headache is to the right side radiating down her neck. Describes the pain as an ache. Endorses nausea.    Reports recent visit with cardiologist who recommended an echo to be done in a few days.

## 2024-08-04 NOTE — Discharge Instructions (Addendum)
 You were seen in the Emergency Department for chest pain and headache There is no evidence of heart attack Your headache improved after a migraine cocktail Follow-up as scheduled with your cardiologist to have additional testing performed Return to the emergency room for severe chest pain trouble breathing or severe headaches

## 2024-08-05 LAB — TROPONIN T, HIGH SENSITIVITY: Troponin T High Sensitivity: <6 ng/L (ref 0–19)

## 2024-08-05 NOTE — ED Provider Notes (Signed)
 I assumed care of this patient from previous provider.  Please see their note for further details of history, exam, and MDM.   Briefly patient is a 34 y.o. female who presented with headache and chest pain. Chest pain only lasted a few minutes and been chest pain-free several hours prior to arrival. EKG was reassuring.  Initial troponin negative.  Currently awaiting delta troponin Patient declined chest x-ray.   Patient provided with migraine cocktail.  Plan to reassess.  On reassessment, patient's headache had completely resolved. Delta troponin was negative.  The patient appears reasonably screened and/or stabilized for discharge and I doubt any other medical condition or other [facility] requiring further screening, evaluation, or treatment in the ED at this time. I have discussed the findings, Dx and Tx plan with the patient/family who expressed understanding and agree(s) with the plan. Discharge instructions discussed at length. The patient/family was given strict return precautions who verbalized understanding of the instructions. No further questions at time of discharge.  Disposition: Discharge  Condition: Good  ED Discharge Orders     None         Follow Up: Arby Lyle LABOR, NP [address] [place] [address] [phone]  Schedule an appointment as soon as possible for a visit in 1 week          Vannary Greening, Raynell Moder, MD 08/05/24 [phone]
